# Patient Record
Sex: Female | Born: 1955 | Race: Black or African American | Hispanic: No | Smoking: Never smoker
Health system: Southern US, Community
[De-identification: ages and names within clinical notes are randomized; demographics above are authoritative.]

## PROBLEM LIST (undated history)

## (undated) DIAGNOSIS — E7800 Pure hypercholesterolemia, unspecified: Secondary | ICD-10-CM

## (undated) DIAGNOSIS — E119 Type 2 diabetes mellitus without complications: Secondary | ICD-10-CM

## (undated) DIAGNOSIS — M1990 Unspecified osteoarthritis, unspecified site: Secondary | ICD-10-CM

## (undated) DIAGNOSIS — I10 Essential (primary) hypertension: Secondary | ICD-10-CM

## (undated) DIAGNOSIS — G5600 Carpal tunnel syndrome, unspecified upper limb: Secondary | ICD-10-CM

## (undated) HISTORY — DX: Essential (primary) hypertension: I10

## (undated) HISTORY — DX: Type 2 diabetes mellitus without complications: E11.9

## (undated) HISTORY — DX: Pure hypercholesterolemia, unspecified: E78.00

## (undated) HISTORY — DX: Unspecified osteoarthritis, unspecified site: M19.90

## (undated) HISTORY — PX: TONSILLECTOMY: SUR1361

## (undated) HISTORY — PX: CHOLECYSTECTOMY: SHX55

## (undated) HISTORY — DX: Carpal tunnel syndrome, unspecified upper limb: G56.00

---

## 1984-10-04 HISTORY — PX: TUBAL LIGATION: SHX77

## 1993-10-04 HISTORY — PX: KNEE ARTHROSCOPY: SUR90

## 1999-11-20 ENCOUNTER — Other Ambulatory Visit: Admission: RE | Admit: 1999-11-20 | Discharge: 1999-11-20 | Payer: Self-pay | Admitting: *Deleted

## 2000-12-01 ENCOUNTER — Other Ambulatory Visit: Admission: RE | Admit: 2000-12-01 | Discharge: 2000-12-01 | Payer: Self-pay | Admitting: *Deleted

## 2002-03-09 ENCOUNTER — Other Ambulatory Visit: Admission: RE | Admit: 2002-03-09 | Discharge: 2002-03-09 | Payer: Self-pay | Admitting: *Deleted

## 2010-12-10 ENCOUNTER — Other Ambulatory Visit: Payer: Self-pay | Admitting: Internal Medicine

## 2010-12-10 DIAGNOSIS — Z1231 Encounter for screening mammogram for malignant neoplasm of breast: Secondary | ICD-10-CM

## 2010-12-25 ENCOUNTER — Ambulatory Visit
Admission: RE | Admit: 2010-12-25 | Discharge: 2010-12-25 | Disposition: A | Discharge status: Home / Self Care | Payer: 59 | Source: Ambulatory Visit | Attending: Internal Medicine | Admitting: Internal Medicine

## 2010-12-25 DIAGNOSIS — Z1231 Encounter for screening mammogram for malignant neoplasm of breast: Secondary | ICD-10-CM

## 2011-11-26 ENCOUNTER — Other Ambulatory Visit: Payer: Self-pay | Admitting: Internal Medicine

## 2011-11-26 DIAGNOSIS — Z1231 Encounter for screening mammogram for malignant neoplasm of breast: Secondary | ICD-10-CM

## 2011-12-27 ENCOUNTER — Ambulatory Visit: Payer: 59

## 2011-12-30 ENCOUNTER — Ambulatory Visit
Admission: RE | Admit: 2011-12-30 | Discharge: 2011-12-30 | Disposition: A | Discharge status: Home / Self Care | Payer: 59 | Source: Ambulatory Visit | Attending: Internal Medicine | Admitting: Internal Medicine

## 2011-12-30 DIAGNOSIS — Z1231 Encounter for screening mammogram for malignant neoplasm of breast: Secondary | ICD-10-CM

## 2012-11-29 ENCOUNTER — Other Ambulatory Visit: Payer: Self-pay

## 2012-11-29 DIAGNOSIS — Z1231 Encounter for screening mammogram for malignant neoplasm of breast: Secondary | ICD-10-CM

## 2013-02-02 ENCOUNTER — Ambulatory Visit: Payer: 59

## 2014-01-09 ENCOUNTER — Other Ambulatory Visit: Payer: Self-pay

## 2014-01-09 DIAGNOSIS — Z1231 Encounter for screening mammogram for malignant neoplasm of breast: Secondary | ICD-10-CM

## 2014-01-17 ENCOUNTER — Ambulatory Visit
Admission: RE | Admit: 2014-01-17 | Discharge: 2014-01-17 | Disposition: A | Discharge status: Home / Self Care | Payer: 59 | Source: Ambulatory Visit

## 2014-01-17 DIAGNOSIS — Z1231 Encounter for screening mammogram for malignant neoplasm of breast: Secondary | ICD-10-CM

## 2014-12-19 ENCOUNTER — Other Ambulatory Visit: Payer: Self-pay

## 2014-12-19 DIAGNOSIS — Z1231 Encounter for screening mammogram for malignant neoplasm of breast: Secondary | ICD-10-CM

## 2015-01-31 ENCOUNTER — Ambulatory Visit: Payer: Self-pay

## 2015-02-06 ENCOUNTER — Ambulatory Visit
Admission: RE | Admit: 2015-02-06 | Discharge: 2015-02-06 | Disposition: A | Discharge status: Home / Self Care | Payer: 59 | Source: Ambulatory Visit

## 2015-02-06 DIAGNOSIS — Z1231 Encounter for screening mammogram for malignant neoplasm of breast: Secondary | ICD-10-CM

## 2015-12-11 ENCOUNTER — Encounter: Payer: 59 | Admitting: Neurology

## 2016-01-01 ENCOUNTER — Encounter: Payer: Self-pay | Admitting: Neurology

## 2016-01-01 ENCOUNTER — Encounter: Payer: 59 | Admitting: Neurology

## 2016-01-01 ENCOUNTER — Ambulatory Visit (INDEPENDENT_AMBULATORY_CARE_PROVIDER_SITE_OTHER): Payer: 59 | Admitting: Neurology

## 2016-01-01 ENCOUNTER — Ambulatory Visit (INDEPENDENT_AMBULATORY_CARE_PROVIDER_SITE_OTHER): Payer: Self-pay | Admitting: Neurology

## 2016-01-01 DIAGNOSIS — G5601 Carpal tunnel syndrome, right upper limb: Secondary | ICD-10-CM

## 2016-01-01 DIAGNOSIS — G5600 Carpal tunnel syndrome, unspecified upper limb: Secondary | ICD-10-CM

## 2016-01-01 HISTORY — DX: Carpal tunnel syndrome, unspecified upper limb: G56.00

## 2016-01-01 NOTE — Procedures (Signed)
HISTORY:  Heather Tanner is a 60 year old patient with a history of diabetes who reports paresthesias and numbness in the feet, she is also had similar sensations in the arms, but she also has some discomfort in the shoulders and the upper arms as well, with no significant neck discomfort. The patient is being evaluated for possible neuropathy or a cervical radiculopathy.  NERVE CONDUCTION STUDIES:  Nerve conduction studies were performed on both upper extremities. The distal motor latencies for the median nerves were prolonged on the right, normal on the left, and with normal motor amplitudes for these nerves bilaterally. The distal motor latencies and motor amplitudes for the ulnar nerves were normal bilaterally. The F wave latencies and nerve conduction velocities for the median and ulnar nerves were normal bilaterally. The sensory latencies for the median nerves were prolonged on the right, normal on the left, with normal ulnar sensory latencies bilaterally.  Nerve conduction studies were performed on the right lower extremity. The distal motor latencies and motor amplitudes for the peroneal and posterior tibial nerves were within normal limits. The nerve conduction velocities for these nerves were also normal. The H reflex latency was normal. The sensory latency for the peroneal nerve was within normal limits.   EMG STUDIES:  EMG study was performed on the right upper extremity:  The first dorsal interosseous muscle reveals 2 to 4 K units with full recruitment. No fibrillations or positive waves were noted. The abductor pollicis brevis muscle reveals 2 to 4 K units with slightly reduced recruitment. No fibrillations or positive waves were noted. The extensor indicis proprius muscle reveals 1 to 3 K units with full recruitment. No fibrillations or positive waves were noted. The pronator teres muscle reveals 2 to 3 K units with full recruitment. No fibrillations or positive waves  were noted. The biceps muscle reveals 1 to 2 K units with full recruitment. No fibrillations or positive waves were noted. The triceps muscle reveals 2 to 4 K units with full recruitment. No fibrillations or positive waves were noted. The anterior deltoid muscle reveals 2 to 3 K units with full recruitment. No fibrillations or positive waves were noted. The cervical paraspinal muscles were tested at 2 levels. No abnormalities of insertional activity were seen at either level tested. There was good relaxation.  EMG study was performed on the left upper extremity:  The first dorsal interosseous muscle reveals 2 to 4 K units with full recruitment. No fibrillations or positive waves were noted. The abductor pollicis brevis muscle reveals 2 to 4 K units with full recruitment. No fibrillations or positive waves were noted. The extensor indicis proprius muscle reveals 1 to 3 K units with full recruitment. No fibrillations or positive waves were noted. The pronator teres muscle reveals 2 to 3 K units with full recruitment. No fibrillations or positive waves were noted. The biceps muscle reveals 1 to 2 K units with full recruitment. No fibrillations or positive waves were noted. The triceps muscle reveals 2 to 4 K units with full recruitment. No fibrillations or positive waves were noted. The anterior deltoid muscle reveals 2 to 3 K units with full recruitment. No fibrillations or positive waves were noted. The cervical paraspinal muscles were tested at 2 levels. No abnormalities of insertional activity were seen at either level tested. There was good relaxation.   IMPRESSION:  Nerve conduction studies done on both upper extremities and on the right lower extremity reveals no clear evidence of a peripheral neuropathy. There does  appear to be evidence of a mild right carpal tunnel syndrome. No other significant abnormalities were seen. EMG evaluation on both upper extremities shows no evidence of an  overlying cervical radiculopathy on either side.  Jill Alexanders MD 01/01/2016 11:12 AM  [facility] [address] [place], [place] [address]  Phone [phone] Fax [phone]

## 2016-01-01 NOTE — Progress Notes (Signed)
Please refer to EMG and nerve conduction study procedure note. 

## 2016-05-13 ENCOUNTER — Other Ambulatory Visit: Payer: Self-pay | Admitting: Internal Medicine

## 2016-05-13 DIAGNOSIS — Z1231 Encounter for screening mammogram for malignant neoplasm of breast: Secondary | ICD-10-CM

## 2016-05-21 ENCOUNTER — Ambulatory Visit
Admission: RE | Admit: 2016-05-21 | Discharge: 2016-05-21 | Disposition: A | Discharge status: Home / Self Care | Payer: 59 | Source: Ambulatory Visit | Attending: Internal Medicine | Admitting: Internal Medicine

## 2016-05-21 DIAGNOSIS — Z1231 Encounter for screening mammogram for malignant neoplasm of breast: Secondary | ICD-10-CM

## 2017-01-10 DIAGNOSIS — G4700 Insomnia, unspecified: Secondary | ICD-10-CM | POA: Diagnosis not present

## 2017-01-10 DIAGNOSIS — I10 Essential (primary) hypertension: Secondary | ICD-10-CM | POA: Diagnosis not present

## 2017-01-10 DIAGNOSIS — E1140 Type 2 diabetes mellitus with diabetic neuropathy, unspecified: Secondary | ICD-10-CM | POA: Diagnosis not present

## 2017-04-13 DIAGNOSIS — I10 Essential (primary) hypertension: Secondary | ICD-10-CM | POA: Diagnosis not present

## 2017-04-13 DIAGNOSIS — Z79899 Other long term (current) drug therapy: Secondary | ICD-10-CM | POA: Diagnosis not present

## 2017-04-13 DIAGNOSIS — E1140 Type 2 diabetes mellitus with diabetic neuropathy, unspecified: Secondary | ICD-10-CM | POA: Diagnosis not present

## 2017-04-27 ENCOUNTER — Other Ambulatory Visit: Payer: Self-pay | Admitting: Internal Medicine

## 2017-04-27 DIAGNOSIS — Z1231 Encounter for screening mammogram for malignant neoplasm of breast: Secondary | ICD-10-CM

## 2017-05-24 ENCOUNTER — Ambulatory Visit
Admission: RE | Admit: 2017-05-24 | Discharge: 2017-05-24 | Disposition: A | Discharge status: Home / Self Care | Payer: 59 | Source: Ambulatory Visit | Attending: Internal Medicine | Admitting: Internal Medicine

## 2017-05-24 DIAGNOSIS — Z1231 Encounter for screening mammogram for malignant neoplasm of breast: Secondary | ICD-10-CM | POA: Diagnosis not present

## 2017-08-17 DIAGNOSIS — I10 Essential (primary) hypertension: Secondary | ICD-10-CM | POA: Diagnosis not present

## 2017-08-17 DIAGNOSIS — G4700 Insomnia, unspecified: Secondary | ICD-10-CM | POA: Diagnosis not present

## 2017-08-17 DIAGNOSIS — E1140 Type 2 diabetes mellitus with diabetic neuropathy, unspecified: Secondary | ICD-10-CM | POA: Diagnosis not present

## 2017-11-25 DIAGNOSIS — I10 Essential (primary) hypertension: Secondary | ICD-10-CM | POA: Diagnosis not present

## 2017-11-25 DIAGNOSIS — E1140 Type 2 diabetes mellitus with diabetic neuropathy, unspecified: Secondary | ICD-10-CM | POA: Diagnosis not present

## 2017-11-25 DIAGNOSIS — E559 Vitamin D deficiency, unspecified: Secondary | ICD-10-CM | POA: Diagnosis not present

## 2017-11-25 DIAGNOSIS — Z0000 Encounter for general adult medical examination without abnormal findings: Secondary | ICD-10-CM | POA: Diagnosis not present

## 2017-11-25 DIAGNOSIS — Z7689 Persons encountering health services in other specified circumstances: Secondary | ICD-10-CM | POA: Diagnosis not present

## 2017-11-30 DIAGNOSIS — Z0000 Encounter for general adult medical examination without abnormal findings: Secondary | ICD-10-CM | POA: Diagnosis not present

## 2017-11-30 DIAGNOSIS — E1140 Type 2 diabetes mellitus with diabetic neuropathy, unspecified: Secondary | ICD-10-CM | POA: Diagnosis not present

## 2017-11-30 DIAGNOSIS — I10 Essential (primary) hypertension: Secondary | ICD-10-CM | POA: Diagnosis not present

## 2017-12-21 DIAGNOSIS — M1990 Unspecified osteoarthritis, unspecified site: Secondary | ICD-10-CM | POA: Insufficient documentation

## 2018-01-12 DIAGNOSIS — M65341 Trigger finger, right ring finger: Secondary | ICD-10-CM | POA: Diagnosis not present

## 2018-01-12 DIAGNOSIS — M65331 Trigger finger, right middle finger: Secondary | ICD-10-CM | POA: Diagnosis not present

## 2018-01-12 DIAGNOSIS — M79641 Pain in right hand: Secondary | ICD-10-CM | POA: Diagnosis not present

## 2018-03-02 DIAGNOSIS — Z1389 Encounter for screening for other disorder: Secondary | ICD-10-CM | POA: Diagnosis not present

## 2018-03-02 DIAGNOSIS — E1140 Type 2 diabetes mellitus with diabetic neuropathy, unspecified: Secondary | ICD-10-CM | POA: Diagnosis not present

## 2018-03-02 DIAGNOSIS — M25569 Pain in unspecified knee: Secondary | ICD-10-CM | POA: Diagnosis not present

## 2018-03-02 DIAGNOSIS — I10 Essential (primary) hypertension: Secondary | ICD-10-CM | POA: Diagnosis not present

## 2018-03-30 DIAGNOSIS — M25562 Pain in left knee: Secondary | ICD-10-CM | POA: Insufficient documentation

## 2018-03-30 DIAGNOSIS — M25561 Pain in right knee: Secondary | ICD-10-CM | POA: Insufficient documentation

## 2018-04-17 ENCOUNTER — Other Ambulatory Visit: Payer: Self-pay | Admitting: Internal Medicine

## 2018-04-17 DIAGNOSIS — Z1231 Encounter for screening mammogram for malignant neoplasm of breast: Secondary | ICD-10-CM

## 2018-06-01 ENCOUNTER — Ambulatory Visit: Payer: 59

## 2018-06-02 ENCOUNTER — Ambulatory Visit
Admission: RE | Admit: 2018-06-02 | Discharge: 2018-06-02 | Disposition: A | Discharge status: Home / Self Care | Payer: 59 | Source: Ambulatory Visit | Attending: Internal Medicine | Admitting: Internal Medicine

## 2018-06-02 DIAGNOSIS — Z1231 Encounter for screening mammogram for malignant neoplasm of breast: Secondary | ICD-10-CM | POA: Diagnosis not present

## 2018-06-07 DIAGNOSIS — I129 Hypertensive chronic kidney disease with stage 1 through stage 4 chronic kidney disease, or unspecified chronic kidney disease: Secondary | ICD-10-CM | POA: Diagnosis not present

## 2018-06-07 DIAGNOSIS — M6530 Trigger finger, unspecified finger: Secondary | ICD-10-CM | POA: Diagnosis not present

## 2018-06-07 DIAGNOSIS — N182 Chronic kidney disease, stage 2 (mild): Secondary | ICD-10-CM | POA: Diagnosis not present

## 2018-06-07 DIAGNOSIS — Z23 Encounter for immunization: Secondary | ICD-10-CM | POA: Diagnosis not present

## 2018-06-07 DIAGNOSIS — E1140 Type 2 diabetes mellitus with diabetic neuropathy, unspecified: Secondary | ICD-10-CM | POA: Diagnosis not present

## 2018-06-22 DIAGNOSIS — M25561 Pain in right knee: Secondary | ICD-10-CM | POA: Diagnosis not present

## 2018-06-22 DIAGNOSIS — M238X2 Other internal derangements of left knee: Secondary | ICD-10-CM | POA: Diagnosis not present

## 2018-06-22 DIAGNOSIS — M238X1 Other internal derangements of right knee: Secondary | ICD-10-CM | POA: Diagnosis not present

## 2018-06-22 DIAGNOSIS — M25562 Pain in left knee: Secondary | ICD-10-CM | POA: Diagnosis not present

## 2018-07-21 DIAGNOSIS — E113391 Type 2 diabetes mellitus with moderate nonproliferative diabetic retinopathy without macular edema, right eye: Secondary | ICD-10-CM | POA: Diagnosis not present

## 2018-07-21 DIAGNOSIS — H43811 Vitreous degeneration, right eye: Secondary | ICD-10-CM | POA: Diagnosis not present

## 2018-07-21 DIAGNOSIS — E113312 Type 2 diabetes mellitus with moderate nonproliferative diabetic retinopathy with macular edema, left eye: Secondary | ICD-10-CM | POA: Diagnosis not present

## 2018-08-02 ENCOUNTER — Encounter: Payer: Self-pay | Admitting: Internal Medicine

## 2018-08-02 ENCOUNTER — Ambulatory Visit: Payer: 59 | Admitting: Internal Medicine

## 2018-08-02 VITALS — BP 118/78 | HR 73 | Temp 97.8°F | Ht 62.5 in | Wt 149.8 lb

## 2018-08-02 DIAGNOSIS — Z794 Long term (current) use of insulin: Secondary | ICD-10-CM | POA: Diagnosis not present

## 2018-08-02 DIAGNOSIS — E113552 Type 2 diabetes mellitus with stable proliferative diabetic retinopathy, left eye: Secondary | ICD-10-CM | POA: Diagnosis not present

## 2018-08-02 DIAGNOSIS — E1140 Type 2 diabetes mellitus with diabetic neuropathy, unspecified: Secondary | ICD-10-CM | POA: Diagnosis not present

## 2018-08-02 DIAGNOSIS — E1122 Type 2 diabetes mellitus with diabetic chronic kidney disease: Secondary | ICD-10-CM | POA: Insufficient documentation

## 2018-08-02 DIAGNOSIS — H259 Unspecified age-related cataract: Secondary | ICD-10-CM | POA: Insufficient documentation

## 2018-08-02 NOTE — Patient Instructions (Signed)
Diabetic Neuropathy Diabetic neuropathy is a nerve disease or nerve damage that is caused by diabetes mellitus. About half of all people with diabetes mellitus have some form of nerve damage. Nerve damage is more common in those who have had diabetes mellitus for many years and who generally have not had good control of their blood sugar (glucose) level. Diabetic neuropathy is a common complication of diabetes mellitus. There are three common types of diabetic neuropathy and a fourth type that is less common and less understood:  Peripheral neuropathy-This is the most common type of diabetic neuropathy. It causes damage to the nerves of the feet and legs first and then eventually the hands and arms. The damage affects the ability to sense touch.  Autonomic neuropathy-This type causes damage to the autonomic nervous system, which controls the following functions: ? Heartbeat. ? Body temperature. ? Blood pressure. ? Urination. ? Digestion. ? Sweating. ? Sexual function.  Focal neuropathy-Focal neuropathy can be painful and unpredictable and occurs most often in older adults with diabetes mellitus. It involves a specific nerve or one area and often comes on suddenly. It usually does not cause long-term problems.  Radiculoplexus neuropathy- Sometimes called lumbosacral radiculoplexus neuropathy, radiculoplexus neuropathy affects the nerves of the thighs, hips, buttocks, or legs. It is more common in people with type 2 diabetes mellitus and in older men. It is characterized by debilitating pain, weakness, and atrophy, usually in the thigh muscles.  What are the causes? The cause of peripheral, autonomic, and focal neuropathies is diabetes mellitus that is uncontrolled and high glucose levels. The cause of radiculoplexus neuropathy is unknown. However, it is thought to be caused by inflammation related to uncontrolled glucose levels. What are the signs or symptoms? Peripheral Neuropathy Peripheral  neuropathy develops slowly over time. When the nerves of the feet and legs no longer work there may be:  Burning, stabbing, or aching pain in the legs or feet.  Inability to feel pressure or pain in your feet. This can lead to: ? Thick calluses over pressure areas. ? Pressure sores. ? Ulcers.  Foot deformities.  Reduced ability to feel temperature changes.  Muscle weakness.  Autonomic Neuropathy The symptoms of autonomic neuropathy vary depending on which nerves are affected. Symptoms may include:  Problems with digestion, such as: ? Feeling sick to your stomach (nausea). ? Vomiting. ? Bloating. ? Constipation. ? Diarrhea. ? Abdominal pain.  Difficulty with urination. This occurs if you lose your ability to sense when your bladder is full. Problems include: ? Urine leakage (incontinence). ? Inability to empty your bladder completely (retention).  Rapid or irregular heartbeat (palpitations).  Blood pressure drops when you stand up (orthostatic hypotension). When you stand up you may feel: ? Dizzy. ? Weak. ? Faint.  In men, inability to attain and maintain an erection.  In women, vaginal dryness and problems with decreased sexual desire and arousal.  Problems with body temperature regulation.  Increased or decreased sweating.  Focal Neuropathy  Abnormal eye movements or abnormal alignment of both eyes.  Weakness in the wrist.  Foot drop. This results in an inability to lift the foot properly and abnormal walking or foot movement.  Paralysis on one side of your face (Bell palsy).  Chest or abdominal pain. Radiculoplexus Neuropathy  Sudden, severe pain in your hip, thigh, or buttocks.  Weakness and wasting of thigh muscles.  Difficulty rising from a seated position.  Abdominal swelling.  Unexplained weight loss (usually more than 10 lb [4.5 kg]). How is   this diagnosed? Peripheral Neuropathy Your senses may be tested. Sensory function testing can be  done with:  A light touch using a monofilament.  A vibration with tuning fork.  A sharp sensation with a pin prick.  Other tests that can help diagnose neuropathy are:  Nerve conduction velocity. This test checks the transmission of an electrical current through a nerve.  Electromyography. This shows how muscles respond to electrical signals transmitted by nearby nerves.  Quantitative sensory testing. This is used to assess how your nerves respond to vibrations and changes in temperature.  Autonomic Neuropathy Diagnosis is often based on reported symptoms. Tell your health care provider if you experience:  Dizziness.  Constipation.  Diarrhea.  Inappropriate urination or inability to urinate.  Inability to get or maintain an erection.  Tests that may be done include:  Electrocardiography or Holter monitor. These are tests that can help show problems with the heart rate or heart rhythm.  An X-ray exam may be done.  Focal Neuropathy Diagnosis is made based on your symptoms and what your health care provider finds during your exam. Other tests may be done. They may include:  Nerve conduction velocities. This checks the transmission of electrical current through a nerve.  Electromyography. This shows how muscles respond to electrical signals transmitted by nearby nerves.  Quantitative sensory testing. This test is used to assess how your nerves respond to vibration and changes in temperature.  Radiculoplexus Neuropathy  Often the first thing is to eliminate any other issue or problems that might be the cause, as there is no standard test for diagnosis.  X-ray exam of your spine and lumbar region.  Spinal tap to rule out cancer.  MRI to rule out other lesions. How is this treated? Once nerve damage occurs, it cannot be reversed. The goal of treatment is to keep the disease or nerve damage from getting worse and affecting more nerve fibers. Controlling your blood  glucose level is the key. Most people with radiculoplexus neuropathy see at least a partial improvement over time. You will need to keep your blood glucose and HbA1c levels in the target range determined by your health care provider. Things that help control blood glucose levels include:  Blood glucose monitoring.  Meal planning.  Physical activity.  Diabetes medicine.  Over time, maintaining lower blood glucose levels helps lessen symptoms. Sometimes, prescription pain medicine is needed. Follow these instructions at home:  Do not smoke.  Keep your blood glucose level in the range that you and your health care provider have determined acceptable for you.  Keep your blood pressure level in the range that you and your health care provider have determined acceptable for you.  Eat a well-balanced diet.  Be physically active every day. Include strength training and balance exercises.  Protect your feet. ? Check your feet every day for sores, cuts, blisters, or signs of infection. ? Wear padded socks and supportive shoes. Use orthotic inserts, if necessary. ? Regularly check the insides of your shoes for worn spots. Make sure there are no rocks or other items inside your shoes before you put them on. Contact a health care provider if:  You have burning, stabbing, or aching pain in the legs or feet.  You are unable to feel pressure or pain in your feet.  You develop problems with digestion such as: ? Nausea. ? Vomiting. ? Bloating. ? Constipation. ? Diarrhea. ? Abdominal pain.  You have difficulty with urination, such as: ? Incontinence. ? Retention.    You have palpitations.  You develop orthostatic hypotension. When you stand up you may feel: ? Dizzy. ? Weak. ? Faint.  You cannot attain and maintain an erection (in men).  You have vaginal dryness and problems with decreased sexual desire and arousal (in women).  You have severe pain in your thighs, legs, or  buttocks.  You have unexplained weight loss. This information is not intended to replace advice given to you by your health care provider. Make sure you discuss any questions you have with your health care provider. Document Released: 11/29/2001 Document Revised: 02/26/2016 Document Reviewed: 03/01/2013 Elsevier Interactive Patient Education  2017 [employer]  

## 2018-08-06 NOTE — Progress Notes (Signed)
Subjective:     Patient ID: Heather Tanner , female    DOB: [birth year] , 62 y.o.   MRN: [id number]   Chief Complaint  Patient presents with  . Gabapentin f/u    HPI  SHE IS HERE TODAY FOR F/U DM NEUROPATHY.  THE DOSE OF GABAPENTIN WAS INCREASED TO 100MG  PO QAM AND 200MG  PO QPM. SHE HAS NOT NOTICED ANY IMPROVEMENT IN HER SX.   ADDITIONALLY, SHE RECENTLY HAD EYE EXAM AND SHE WANTS TO DISCUSS FURTHER.     Past Medical History:  Diagnosis Date  . Carpal tunnel syndrome 01/01/2016   right     Family History  Problem Relation Age of Onset  . Diabetes Mother   . Heart Problems Mother   . Hypertension Mother   . Diabetes Father   . Hypertension Father   . Heart Problems Father   . Hypertension Sister   . Stroke Sister   . Hyperlipidemia Sister   . Heart Problems Brother      Current Outpatient Medications:  .  Albuterol Sulfate (PROAIR RESPICLICK) 027 (90 Base) MCG/ACT AEPB, Inhale into the lungs., Disp: , Rfl:  .  BABY ASPIRIN PO, Take by mouth daily., Disp: , Rfl:  .  CALCIUM-MAGNESIUM-ZINC PO, Take by mouth., Disp: , Rfl:  .  gabapentin (NEURONTIN) 300 MG capsule, Take 300 mg by mouth 3 (three) times daily., Disp: , Rfl:  .  Insulin Detemir (LEVEMIR FLEXTOUCH) 100 UNIT/ML Pen, Inject 20 Units into the skin at bedtime., Disp: , Rfl:  .  Insulin Pen Needle (PEN NEEDLES) 31G X 5 MM MISC, by Does not apply route., Disp: , Rfl:  .  lisinopril (PRINIVIL,ZESTRIL) 10 MG tablet, Take 10 mg by mouth daily., Disp: , Rfl:  .  Multiple Vitamin (MULTIVITAMIN) tablet, Take 1 tablet by mouth daily., Disp: , Rfl:  .  naproxen sodium (ALEVE) 220 MG tablet, Take 220 mg by mouth 2 (two) times daily as needed., Disp: , Rfl:  .  ranitidine (ZANTAC) 150 MG tablet, Take 150 mg by mouth 2 (two) times daily., Disp: , Rfl:  .  SitaGLIPtin-MetFORMIN HCl (JANUMET XR) 50-1000 MG TB24, Take 1 tablet by mouth daily., Disp: , Rfl:    No Known Allergies   Review of Systems  Constitutional:  Negative.   Respiratory: Negative.   Cardiovascular: Negative.   Gastrointestinal: Negative.   Endocrine: Negative.   Genitourinary: Negative.   Neurological: Positive for numbness.  Psychiatric/Behavioral: Negative.      Today's Vitals   08/02/18 1518  BP: 118/72  Pulse: 73  Temp: 97.8 F (36.6 C)  Weight: 149 lb 12.8 oz (67.9 kg)  Height: 5' 2.5" (1.588 m)   Body mass index is 26.96 kg/m.   Objective:  Physical Exam  Constitutional: She is oriented to person, place, and time. She appears well-developed and well-nourished.  HENT:  Head: Normocephalic and atraumatic.  Eyes: EOM are normal.  Cardiovascular: Normal rate, regular rhythm and normal heart sounds.  Pulmonary/Chest: Effort normal and breath sounds normal.  Neurological: She is alert and oriented to person, place, and time.  Skin: Skin is warm and dry.  Psychiatric: She has a normal mood and affect.  Nursing note and vitals reviewed.       Assessment And Plan:     1. Type 2 diabetes mellitus with diabetic neuropathy, with long-term current use of insulin (HCC)  I WILL D/C GABAPENTIN. I WILL START HER ON LYRICA 50MG  TWICE DAILY X 7DAYS, THEN INCREASE HER DOSE TO  75MG  TWICE DAILY. SHE WILL RTO IN SIX WEEKS FOR RE-EVALUATION. POSSIBLE SIDE EFFECTS WERE DISCUSSED WITH THE PATIENT IN DETAIL.   2. Senile cataract of left eye, unspecified age-related cataract type  MGMT AS PER OPHTHALMOLOGY.   3. 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 IMPORTANCE OF OPTIMAL BS CONTROL TO AVOID PROGRESSION OF DISEASE WAS DISCUSSED WITH THE PATIENT. I WILL REQUEST RECORDS FROM HER OPHTHALMOLOGIST. GREATER THAN 50% OF FACE TO FACE TIME WAS SPENT IN COUNSELING AND COORDINATION OF CARE. I SPENT MORE THAN 25 MIN WITH THE PATIENT.    [name], MD

## 2018-08-08 ENCOUNTER — Encounter: Payer: Self-pay | Admitting: Internal Medicine

## 2018-08-09 ENCOUNTER — Other Ambulatory Visit: Payer: Self-pay

## 2018-08-09 MED ORDER — PREGABALIN 50 MG PO CAPS: ORAL_CAPSULE | ORAL | 0 refills | Status: DC

## 2018-08-13 ENCOUNTER — Other Ambulatory Visit: Payer: Self-pay | Admitting: Internal Medicine

## 2018-08-13 DIAGNOSIS — D509 Iron deficiency anemia, unspecified: Secondary | ICD-10-CM

## 2018-08-13 DIAGNOSIS — D75839 Thrombocytosis, unspecified: Secondary | ICD-10-CM

## 2018-08-13 DIAGNOSIS — D473 Essential (hemorrhagic) thrombocythemia: Secondary | ICD-10-CM

## 2018-08-16 ENCOUNTER — Telehealth: Payer: Self-pay | Admitting: Hematology

## 2018-08-16 ENCOUNTER — Encounter: Payer: Self-pay | Admitting: Internal Medicine

## 2018-08-16 NOTE — Telephone Encounter (Signed)
lmom for pt to return call to office to schedule new patient appt. Mailed appt letter to inform pt of appt date 09/08/18 at 1145 am

## 2018-08-22 ENCOUNTER — Encounter: Payer: Self-pay | Admitting: Internal Medicine

## 2018-08-22 ENCOUNTER — Telehealth: Payer: Self-pay

## 2018-08-22 ENCOUNTER — Other Ambulatory Visit: Payer: Self-pay | Admitting: Internal Medicine

## 2018-08-22 NOTE — Progress Notes (Signed)
Pt's dose of Lyrica changed to 75mg  twice daily.

## 2018-08-22 NOTE — Telephone Encounter (Signed)
Patient called stating she thinks she picked up the incorrect medication today she stated the dose is not the same as the previous dose. She stated the pills are 165mg  and the one she was on before was 50 and 75mg . She stated she has not started taking the medicine until we give her the clear to go ahead and do so. Please advise YRL,RMA

## 2018-08-30 DIAGNOSIS — M25562 Pain in left knee: Secondary | ICD-10-CM | POA: Diagnosis not present

## 2018-08-30 DIAGNOSIS — M25561 Pain in right knee: Secondary | ICD-10-CM | POA: Diagnosis not present

## 2018-08-30 DIAGNOSIS — M1711 Unilateral primary osteoarthritis, right knee: Secondary | ICD-10-CM | POA: Diagnosis not present

## 2018-09-08 ENCOUNTER — Ambulatory Visit: Payer: 59

## 2018-09-08 ENCOUNTER — Other Ambulatory Visit: Payer: 59

## 2018-09-08 ENCOUNTER — Ambulatory Visit: Payer: 59 | Admitting: Hematology

## 2018-09-11 ENCOUNTER — Ambulatory Visit: Payer: 59 | Admitting: Internal Medicine

## 2018-09-20 DIAGNOSIS — E113312 Type 2 diabetes mellitus with moderate nonproliferative diabetic retinopathy with macular edema, left eye: Secondary | ICD-10-CM | POA: Diagnosis not present

## 2018-10-12 ENCOUNTER — Ambulatory Visit: Payer: 59 | Admitting: Internal Medicine

## 2018-10-12 ENCOUNTER — Encounter: Payer: Self-pay | Admitting: Internal Medicine

## 2018-10-12 VITALS — BP 110/74 | HR 98 | Temp 78.0°F | Ht 62.5 in | Wt 145.6 lb

## 2018-10-12 DIAGNOSIS — N182 Chronic kidney disease, stage 2 (mild): Secondary | ICD-10-CM | POA: Diagnosis not present

## 2018-10-12 DIAGNOSIS — Z794 Long term (current) use of insulin: Secondary | ICD-10-CM | POA: Diagnosis not present

## 2018-10-12 DIAGNOSIS — E1141 Type 2 diabetes mellitus with diabetic mononeuropathy: Secondary | ICD-10-CM | POA: Diagnosis not present

## 2018-10-12 DIAGNOSIS — I129 Hypertensive chronic kidney disease with stage 1 through stage 4 chronic kidney disease, or unspecified chronic kidney disease: Secondary | ICD-10-CM

## 2018-10-12 MED ORDER — GABAPENTIN ENACARBIL ER 600 MG PO TBCR: 600.0000 mg | EXTENDED_RELEASE_TABLET | Freq: Two times a day (BID) | ORAL | 1 refills | Status: DC

## 2018-10-12 NOTE — Patient Instructions (Signed)
Diabetic Neuropathy Diabetic neuropathy refers to nerve damage that is caused by diabetes (diabetes mellitus). Over time, people with diabetes can develop nerve damage throughout the body. There are several types of diabetic neuropathy:  Peripheral neuropathy. This is the most common type of diabetic neuropathy. It causes damage to nerves that carry signals between the spinal cord and other parts of the body (peripheral nerves). This usually affects nerves in the feet and legs first, and may eventually affect the hands and arms. The damage affects the ability to sense touch or temperature.  Autonomic neuropathy. This type causes damage to nerves that control involuntary functions (autonomic nerves). These nerves carry signals that control: ? Heartbeat. ? Body temperature. ? Blood pressure. ? Urination. ? Digestion. ? Sweating. ? Sexual function. ? Response to changing blood sugar (glucose) levels.  Focal neuropathy. This type of nerve damage affects one area of the body, such as an arm, a leg, or the face. The injury may involve one nerve or a small group of nerves. Focal neuropathy can be painful and unpredictable, and occurs most often in older adults with diabetes. This often develops suddenly, but usually improves over time and does not cause long-term problems.  Proximal neuropathy. This type of nerve damage affects the nerves of the thighs, hips, buttocks, or legs. It causes severe pain, weakness, and muscle death (atrophy), usually in the thigh muscles. It is more common among older men and people who have type 2 diabetes. The length of recovery time may vary. What are the causes? Peripheral, autonomic, and focal neuropathies are caused by diabetes that is not well controlled with treatment. The cause of proximal neuropathy is not known, but it may be caused by inflammation related to uncontrolled blood glucose levels. What are the signs or symptoms? Peripheral neuropathy Peripheral  neuropathy develops slowly over time. When the nerves of the feet and legs no longer work, you may experience:  Burning, stabbing, or aching pain in the legs or feet.  Pain or cramping in the legs or feet.  Loss of feeling (numbness) and inability to feel pressure or pain in the feet. This can lead to: ? Thick calluses or sores on areas of constant pressure. ? Ulcers. ? Reduced ability to feel temperature changes.  Foot deformities.  Muscle weakness.  Loss of balance or coordination. Autonomic neuropathy The symptoms of autonomic neuropathy vary depending on which nerves are affected. Symptoms may include:  Problems with digestion, such as: ? Nausea or vomiting. ? Poor appetite. ? Bloating. ? Diarrhea or constipation. ? Trouble swallowing. ? Losing weight without trying to.  Problems with the heart, blood and lungs, such as: ? Dizziness, especially when standing up. ? Fainting. ? Shortness of breath. ? Irregular heartbeat.  Bladder problems, such as: ? Trouble starting or stopping urination. ? Leaking urine. ? Trouble emptying the bladder. ? Urinary tract infections (UTIs).  Problems with other body functions, such as: ? Sweat. You may sweat too much or too little. ? Temperature. You might get hot easily. Or, you might feel cold more than usual. ? Sexual function. Men may not be able to get or maintain an erection. Women may have vaginal dryness and difficulty with arousal. Focal neuropathy Symptoms affect only one area of the body. Common symptoms include:  Numbness.  Tingling.  Burning pain.  Prickling feeling.  Very sensitive skin.  Weakness.  Inability to move (paralysis).  Muscle twitching.  Muscles getting smaller (wasting).  Poor coordination.  Double or blurred vision. Proximal   neuropathy  Sudden, severe pain in the hip, thigh, or buttocks. Pain may spread from the back into the legs (sciatica).  Pain and numbness in the arms and  legs.  Tingling.  Loss of bladder control or bowel control.  Weakness and wasting of thigh muscles.  Difficulty getting up from a seated position.  Abdominal swelling.  Unexplained weight loss. How is this diagnosed? Diagnosis usually involves reviewing your medical history and any symptoms you have. Diagnosis varies depending on the type of neuropathy your health care provider suspects. Peripheral neuropathy Your health care provider will check areas that are affected by your nervous system (neurologic exam), such as your reflexes, how you move, and what you can feel. You may have other tests, such as:  Blood tests.  Removal and examination of fluid that surrounds the spinal cord (lumbar puncture).  CT scan.  MRI.  A test to check the nerves that control muscles (electromyogram, EMG).  Tests of how quickly messages pass through your nerves (nerve conduction velocity tests).  Removal of a small piece of nerve to be examined under a microscope (biopsy). Autonomic neuropathy You may have tests, such as:  Tests to measure your blood pressure and heart rate. This may include monitoring you while you are safely secured to an exam table that moves you from a lying position to an upright position (table tilt test).  Breathing tests to check your lungs.  Tests to check how food moves through the digestive system (gastric emptying tests).  Blood, sweat, or urine tests.  Ultrasound of your bladder.  Spinal fluid tests. Focal neuropathy This condition may be diagnosed with:  A neurologic exam.  CT scan.  MRI.  EMG.  Nerve conduction velocity tests. Proximal neuropathy There is no test to diagnose this type of neuropathy. You may have tests to rule out other possible causes of this type of neuropathy. Tests may include:  X-rays of your spine and lumbar region.  Lumbar puncture.  MRI. How is this treated? The goal of treatment is to keep nerve damage from getting  worse. The most important part of treatment is keeping your blood glucose level and your A1C level within your target range by following your diabetes management plan. Over time, maintaining lower blood glucose levels helps lessen symptoms. In some cases, you may need prescription pain medicine. Follow these instructions at home:  Lifestyle   Do not use any products that contain nicotine or tobacco, such as cigarettes and e-cigarettes. If you need help quitting, ask your health care provider.  Be physically active every day. Include strength training and balance exercises.  Follow a healthy meal plan.  Work with your health care provider to manage your blood pressure. General instructions  Follow your diabetes management plan as directed. ? Check your blood glucose levels as directed by your health care provider. ? Keep your blood glucose in your target range as directed by your health care provider. ? Have your A1C level checked at least two times a year, or as often as told by your health care provider.  Take over the counter and prescription medicines only as told by your health care provider. This includes insulin and diabetes medicine.  Do not drive or use heavy machinery while taking prescription pain medicines.  Check your skin and feet every day for cuts, bruises, redness, blisters, or sores.  Keep all follow up visits as told by your health care provider. This is important. Contact a health care provider if:  by your health care provider.  ? Keep your blood glucose in your target range as directed by your health care provider.  ? Have your A1C level checked at least two times a year, or as often as told by your health care provider.  · Take over the counter and prescription medicines only as told by your health care provider. This includes insulin and diabetes medicine.  · Do not drive or use heavy machinery while taking prescription pain medicines.  · Check your skin and feet every day for cuts, bruises, redness, blisters, or sores.  · Keep all follow up visits as told by your health care provider. This is important.  Contact a health care provider if:  · You have burning, stabbing, or aching pain in your legs or feet.  · You are unable to feel pressure or pain in your feet.  · You develop problems with digestion, such as:  ? Nausea.  ? Vomiting.  ? Bloating.  ? Constipation.  ? Diarrhea.  ? Abdominal pain.  · You have difficulty with urination, such as inability:  ? To control when you urinate (incontinence).  ? To completely empty the bladder (retention).  · You have palpitations.  · You feel dizzy, weak, or faint when you  stand up.  Get help right away if:  · You cannot urinate.  · You have sudden weakness or loss of coordination.  · You have trouble speaking.  · You have pain or pressure in your chest.  · You have an irregular heart beat.  · You have sudden inability to move a part of your body.  Summary  · Diabetic neuropathy refers to nerve damage that is caused by diabetes. It can affect nerves throughout the entire body, causing numbness and pain in the arms, legs, digestive tract, heart, and other body systems.  · Keep your blood glucose level and your blood pressure in your target range, as directed by your health care provider. This can help prevent neuropathy from getting worse.  · Check your skin and feet every day for cuts, bruises, redness, blisters, or sores.  · Do not use any products that contain nicotine or tobacco, such as cigarettes and e-cigarettes. If you need help quitting, ask your health care provider.  This information is not intended to replace advice given to you by your health care provider. Make sure you discuss any questions you have with your health care provider.  Document Released: 11/29/2001 Document Revised: 11/02/2017 Document Reviewed: 10/25/2016  Elsevier Interactive Patient Education © 2019 [employer]

## 2018-10-13 LAB — BMP8+EGFR
BUN/Creatinine Ratio: 24 (ref 12–28)
BUN: 28 mg/dL — ABNORMAL HIGH (ref 8–27)
CO2: 22 mmol/L (ref 20–29)
Calcium: 10.1 mg/dL (ref 8.7–10.3)
Chloride: 99 mmol/L (ref 96–106)
Creatinine, Ser: 1.15 mg/dL — ABNORMAL HIGH (ref 0.57–1.00)
GFR calc Af Amer: 58 mL/min/{1.73_m2} — ABNORMAL LOW (ref >59)
GFR calc non Af Amer: 50 mL/min/{1.73_m2} — ABNORMAL LOW (ref >59)
Glucose: 77 mg/dL (ref 65–99)
POTASSIUM: 4.4 mmol/L (ref 3.5–5.2)
Sodium: 141 mmol/L (ref 134–144)

## 2018-10-13 LAB — HEMOGLOBIN A1C
Est. average glucose Bld gHb Est-mCnc: 137 mg/dL
HEMOGLOBIN A1C: 6.4 % — AB (ref 4.8–5.6)

## 2018-10-13 LAB — HIV ANTIBODY (ROUTINE TESTING W REFLEX): HIV Screen 4th Generation wRfx: NONREACTIVE

## 2018-10-14 ENCOUNTER — Encounter: Payer: Self-pay | Admitting: Internal Medicine

## 2018-10-16 ENCOUNTER — Other Ambulatory Visit: Payer: Self-pay | Admitting: Internal Medicine

## 2018-10-16 NOTE — Progress Notes (Signed)
Here are your lab results:  You are HIV negative.  Your kidney function is slightly decreased. Be sure to stay hydrated. Your hba1c is 6.4, wonderful!  Have a great week!   Sincerely,    Yetzali Weld N. Baird Cancer, MD

## 2018-10-18 ENCOUNTER — Telehealth: Payer: Self-pay

## 2018-10-18 NOTE — Telephone Encounter (Signed)
Left the pt a message that a savings card for janumet has been placed up front for pick-up and a single rx card for the gabapentin.

## 2018-10-23 NOTE — Progress Notes (Signed)
Subjective:     Patient ID: Heather Tanner , female    DOB: [birth year] , 63 y.o.   MRN: [id number]   Chief Complaint  Patient presents with  . Lyrica f/u    HPI  She is here today for f/u diabetic neuropathy. She was started on Lyrica at her last visit. She reports she did not have any improvement of her sx. She wants to resume gabapentin.     Past Medical History:  Diagnosis Date  . Carpal tunnel syndrome 01/01/2016   right     Family History  Problem Relation Age of Onset  . Diabetes Mother   . Heart Problems Mother   . Hypertension Mother   . Diabetes Father   . Hypertension Father   . Heart Problems Father   . Hypertension Sister   . Stroke Sister   . Hyperlipidemia Sister   . Heart Problems Brother      Current Outpatient Medications:  .  Albuterol Sulfate (PROAIR RESPICLICK) 627 (90 Base) MCG/ACT AEPB, Inhale into the lungs., Disp: , Rfl:  .  BABY ASPIRIN PO, Take by mouth daily., Disp: , Rfl:  .  CALCIUM-MAGNESIUM-ZINC PO, Take by mouth., Disp: , Rfl:  .  Insulin Detemir (LEVEMIR FLEXTOUCH) 100 UNIT/ML Pen, Inject 20 Units into the skin at bedtime., Disp: , Rfl:  .  Insulin Pen Needle (PEN NEEDLES) 31G X 5 MM MISC, by Does not apply route., Disp: , Rfl:  .  lisinopril (PRINIVIL,ZESTRIL) 10 MG tablet, Take 10 mg by mouth daily., Disp: , Rfl:  .  Multiple Vitamin (MULTIVITAMIN) tablet, Take 1 tablet by mouth daily., Disp: , Rfl:  .  naproxen sodium (ALEVE) 220 MG tablet, Take 220 mg by mouth 2 (two) times daily as needed., Disp: , Rfl:  .  SitaGLIPtin-MetFORMIN HCl (JANUMET XR) 50-1000 MG TB24, Take 1 tablet by mouth daily., Disp: , Rfl:  .  Gabapentin Enacarbil (HORIZANT) 600 MG TBCR, Take 1 tablet (600 mg total) by mouth 2 (two) times daily., Disp: 60 tablet, Rfl: 1 .  JANUMET XR [phone] MG TB24, TAKE ONE TABLET BY MOUTH DAILY WITH EVENING, Disp: 30 tablet, Rfl: 4 .  pregabalin (LYRICA) 75 MG capsule, Take 75 mg by mouth 2 (two) times daily., Disp: , Rfl:   .  ranitidine (ZANTAC) 150 MG tablet, Take 150 mg by mouth 2 (two) times daily., Disp: , Rfl:    No Known Allergies   Review of Systems  Constitutional: Negative.   Respiratory: Negative.   Cardiovascular: Negative.   Gastrointestinal: Negative.   Neurological: Negative.   Psychiatric/Behavioral: Negative.      Today's Vitals   10/12/18 1522  BP: 110/74  Pulse: 98  Temp: (!) 78 F (25.6 C)  TempSrc: Oral  Weight: 145 lb 9.6 oz (66 kg)  Height: 5' 2.5" (1.588 m)   Body mass index is 26.21 kg/m.   Objective:  Physical Exam Vitals signs and nursing note reviewed.  Constitutional:      Appearance: Normal appearance.  HENT:     Head: Normocephalic and atraumatic.  Cardiovascular:     Rate and Rhythm: Normal rate and regular rhythm.     Heart sounds: Normal heart sounds.  Pulmonary:     Effort: Pulmonary effort is normal.     Breath sounds: Normal breath sounds.  Skin:    General: Skin is warm.  Neurological:     Mental Status: She is alert.  Psychiatric:        Mood and Affect:  Mood normal.         Assessment And Plan:     1. Diabetic mononeuropathy associated with type 2 diabetes mellitus ([place])  She has d/c'd Lyrica. She would like to resume gabapentin. She would like to have a rx sent to the pharmacy today. I will check her labs today. I will make further recommendations once her labs are available for review.   - HIV antibody (with reflex) - BMP8+EGFR - Hemoglobin A1c  2. Hypertensive nephropathy  Well controlled. She will continue with current meds.    3. Chronic renal disease, stage II  Chronic. She is encouraged to stay well hydrated.   [name], MD

## 2018-12-06 ENCOUNTER — Encounter: Payer: Self-pay | Admitting: Internal Medicine

## 2019-01-12 ENCOUNTER — Other Ambulatory Visit: Payer: Self-pay | Admitting: Internal Medicine

## 2019-01-16 ENCOUNTER — Encounter: Payer: Self-pay | Admitting: Internal Medicine

## 2019-01-16 ENCOUNTER — Other Ambulatory Visit: Payer: Self-pay

## 2019-01-16 ENCOUNTER — Other Ambulatory Visit (HOSPITAL_COMMUNITY)
Admission: RE | Admit: 2019-01-16 | Discharge: 2019-01-16 | Disposition: A | Discharge status: Home / Self Care | Payer: 59 | Source: Ambulatory Visit | Attending: Internal Medicine | Admitting: Internal Medicine

## 2019-01-16 ENCOUNTER — Ambulatory Visit (INDEPENDENT_AMBULATORY_CARE_PROVIDER_SITE_OTHER): Payer: 59 | Admitting: Internal Medicine

## 2019-01-16 VITALS — BP 128/74 | HR 76 | Temp 98.1°F | Ht 62.75 in | Wt 140.6 lb

## 2019-01-16 DIAGNOSIS — Z794 Long term (current) use of insulin: Secondary | ICD-10-CM

## 2019-01-16 DIAGNOSIS — Z1211 Encounter for screening for malignant neoplasm of colon: Secondary | ICD-10-CM | POA: Diagnosis not present

## 2019-01-16 DIAGNOSIS — Z0000 Encounter for general adult medical examination without abnormal findings: Secondary | ICD-10-CM | POA: Diagnosis not present

## 2019-01-16 DIAGNOSIS — E2839 Other primary ovarian failure: Secondary | ICD-10-CM

## 2019-01-16 DIAGNOSIS — E1140 Type 2 diabetes mellitus with diabetic neuropathy, unspecified: Secondary | ICD-10-CM | POA: Diagnosis not present

## 2019-01-16 DIAGNOSIS — I129 Hypertensive chronic kidney disease with stage 1 through stage 4 chronic kidney disease, or unspecified chronic kidney disease: Secondary | ICD-10-CM | POA: Diagnosis not present

## 2019-01-16 DIAGNOSIS — Z01419 Encounter for gynecological examination (general) (routine) without abnormal findings: Secondary | ICD-10-CM | POA: Insufficient documentation

## 2019-01-16 DIAGNOSIS — N182 Chronic kidney disease, stage 2 (mild): Secondary | ICD-10-CM | POA: Diagnosis not present

## 2019-01-16 LAB — POCT UA - MICROALBUMIN
Albumin/Creatinine Ratio, Urine, POC: 300
Creatinine, POC: 300 mg/dL
Microalbumin Ur, POC: 80 mg/L

## 2019-01-16 LAB — POCT URINALYSIS DIPSTICK
Bilirubin, UA: NEGATIVE
Blood, UA: NEGATIVE
Glucose, UA: NEGATIVE
Ketones, UA: NEGATIVE
Leukocytes, UA: NEGATIVE
Nitrite, UA: POSITIVE
Protein, UA: POSITIVE — AB
Spec Grav, UA: 1.025 (ref 1.010–1.025)
Urobilinogen, UA: 0.2 E.U./dL
pH, UA: 5.5 (ref 5.0–8.0)

## 2019-01-16 LAB — POC HEMOCCULT BLD/STL (OFFICE/1-CARD/DIAGNOSTIC)
Card #1 Date: 6302020
Fecal Occult Blood, POC: NEGATIVE

## 2019-01-16 MED ORDER — GLUCOSE BLOOD VI STRP: ORAL_STRIP | 11 refills | Status: DC

## 2019-01-16 NOTE — Progress Notes (Signed)
Subjective:     Patient ID: Heather Tanner , female    DOB: 11-21-1955 , 63 y.o.   MRN: [id number]   Chief Complaint  Patient presents with  . Annual Exam  . Diabetes  . Hypertension    HPI  She is here today for a full physical exam. She is no longer followed by GYN. She would like to have a pap smear performed today. She has no specific concerns or complaints. She does add that her insurance [employer] will no longer cover Levemir. She must switch to another agent.   Diabetes  She presents for her follow-up diabetic visit. She has type 2 diabetes mellitus. Her disease course has been stable. There are no hypoglycemic associated symptoms. Pertinent negatives for diabetes include no blurred vision and no chest pain. There are no hypoglycemic complications. Symptoms are stable. Risk factors for coronary artery disease include diabetes mellitus, dyslipidemia, hypertension, post-menopausal and sedentary lifestyle. She is following a diabetic diet. She participates in exercise intermittently. Her home blood glucose trend is fluctuating minimally. Her breakfast blood glucose is taken between 8-9 am. Her breakfast blood glucose range is generally 110-130 mg/dl. An ACE inhibitor/angiotensin II receptor blocker is being taken. Eye exam is current.  Hypertension  This is a chronic problem. The current episode started more than 1 year ago. The problem has been gradually improving since onset. The problem is controlled. Pertinent negatives include no blurred vision, chest pain, palpitations or shortness of breath.     Past Medical History:  Diagnosis Date  . Carpal tunnel syndrome 01/01/2016   right     Family History  Problem Relation Age of Onset  . Diabetes Mother   . Heart Problems Mother   . Hypertension Mother   . Diabetes Father   . Hypertension Father   . Heart Problems Father   . Hypertension Sister   . Stroke Sister   . Hyperlipidemia Sister   . Heart Problems Brother       Current Outpatient Medications:  .  Albuterol Sulfate (PROAIR RESPICLICK) 778 (90 Base) MCG/ACT AEPB, Inhale into the lungs., Disp: , Rfl:  .  BABY ASPIRIN PO, Take by mouth daily., Disp: , Rfl:  .  CALCIUM-MAGNESIUM-ZINC PO, Take by mouth., Disp: , Rfl:  .  Insulin Detemir (LEVEMIR FLEXTOUCH) 100 UNIT/ML Pen, Inject 20 Units into the skin at bedtime., Disp: , Rfl:  .  Insulin Pen Needle (PEN NEEDLES) 31G X 5 MM MISC, by Does not apply route., Disp: , Rfl:  .  JANUMET XR [phone] MG TB24, TAKE ONE TABLET BY MOUTH DAILY WITH EVENING, Disp: 30 tablet, Rfl: 4 .  lisinopril (PRINIVIL,ZESTRIL) 10 MG tablet, TAKE ONE TABLET BY MOUTH DAILY, Disp: 90 tablet, Rfl: 1 .  Multiple Vitamin (MULTIVITAMIN) tablet, Take 1 tablet by mouth daily., Disp: , Rfl:  .  naproxen sodium (ALEVE) 220 MG tablet, Take 220 mg by mouth 2 (two) times daily as needed., Disp: , Rfl:  .  ranitidine (ZANTAC) 150 MG tablet, Take 150 mg by mouth 2 (two) times daily., Disp: , Rfl:  .  Gabapentin Enacarbil (HORIZANT) 600 MG TBCR, Take 1 tablet (600 mg total) by mouth 2 (two) times daily. (Patient not taking: Reported on 01/16/2019), Disp: 60 tablet, Rfl: 1 .  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  pregabalin (LYRICA) 75 MG capsule, Take 75 mg by mouth  2 (two) times daily., Disp: , Rfl:    No Known Allergies    The patient states she uses post menopausal status for birth control. Last LMP was No LMP recorded. Patient is postmenopausal.. Negative for: breast discharge, breast lump(s), breast pain and breast self exam. Associated symptoms include abnormal vaginal bleeding. Pertinent negatives include abnormal bleeding (hematology), anxiety, decreased libido, depression, difficulty falling sleep, dyspareunia, history of infertility, nocturia, sexual dysfunction, sleep disturbances, urinary incontinence, urinary urgency,  vaginal discharge and vaginal itching. Diet regular.The patient states her exercise level is  intermittent, minimal.   . The patient's tobacco use is:  Social History   Tobacco Use  Smoking Status Never Smoker  Smokeless Tobacco Never Used  . She has been exposed to passive smoke. The patient's alcohol use is:  Social History   Substance and Sexual Activity  Alcohol Use Yes   Comment: 3 drinks monthly  . Additional information: Last pap 2016, next one scheduled for TODAY.   Review of Systems  Constitutional: Negative.   HENT: Negative.   Eyes: Negative.  Negative for blurred vision.  Respiratory: Negative.  Negative for shortness of breath.   Cardiovascular: Negative.  Negative for chest pain and palpitations.  Gastrointestinal: Negative.   Endocrine: Negative.   Genitourinary: Negative.   Musculoskeletal: Negative.   Skin: Negative.   Allergic/Immunologic: Negative.   Neurological: Negative.   Hematological: Negative.   Psychiatric/Behavioral: Negative.      Today's Vitals   01/16/19 0903  BP: 128/74  Pulse: 76  Temp: 98.1 F (36.7 C)  TempSrc: Oral  Weight: 140 lb 9.6 oz (63.8 kg)  Height: 5' 2.75" (1.594 m)   Body mass index is 25.11 kg/m.   Objective:  Physical Exam Vitals signs and nursing note reviewed. Exam conducted with a chaperone present.  Constitutional:      Appearance: Normal appearance.  HENT:     Head: Normocephalic and atraumatic.     Right Ear: Tympanic membrane, ear canal and external ear normal.     Left Ear: Tympanic membrane, ear canal and external ear normal.     Nose: Nose normal.     Mouth/Throat:     Mouth: Mucous membranes are moist.     Pharynx: Oropharynx is clear.  Eyes:     Extraocular Movements: Extraocular movements intact.     Conjunctiva/sclera: Conjunctivae normal.     Pupils: Pupils are equal, round, and reactive to light.  Neck:     Musculoskeletal: Normal range of motion and neck supple.  Cardiovascular:     Rate  and Rhythm: Normal rate and regular rhythm.     Pulses: Normal pulses.          Dorsalis pedis pulses are 2+ on the right side and 2+ on the left side.       Posterior tibial pulses are 2+ on the right side and 2+ on the left side.     Heart sounds: Normal heart sounds.  Pulmonary:     Effort: Pulmonary effort is normal.     Breath sounds: Normal breath sounds.  Chest:     Breasts:        Right: Normal. No swelling, bleeding, inverted nipple, mass or nipple discharge.        Left: Normal. No swelling, bleeding, inverted nipple, mass or nipple discharge.  Abdominal:     General: Abdomen is flat. Bowel sounds are normal.     Palpations: Abdomen is soft.     Hernia: There is no  hernia in the right inguinal area or left inguinal area.  Genitourinary:    General: Normal vulva.     Exam position: Lithotomy position.     Vagina: Normal. No vaginal discharge.     Cervix: Normal.     Uterus: Normal.      Adnexa: Right adnexa normal and left adnexa normal.     Rectum: Normal. Guaiac result negative.     Comments: deferred Musculoskeletal: Normal range of motion.     Right foot: Normal range of motion. No deformity.     Left foot: Normal range of motion. No deformity.  Feet:     Right foot:     Protective Sensation: 5 sites tested. 5 sites sensed.     Skin integrity: Skin integrity normal.     Left foot:     Protective Sensation: 5 sites tested. 5 sites sensed.     Skin integrity: Skin integrity normal.  Lymphadenopathy:     Lower Body: No right inguinal adenopathy. No left inguinal adenopathy.  Skin:    General: Skin is warm and dry.  Neurological:     General: No focal deficit present.     Mental Status: She is alert and oriented to person, place, and time.  Psychiatric:        Mood and Affect: Mood normal.        Behavior: Behavior normal.         Assessment And Plan:     1. Routine general medical examination at health care facility  A full exam was performed. Importance  of monthly self breast exams was discussed with the patient. PATIENT HAS BEEN ADVISED TO GET 30-45 MINUTES REGULAR EXERCISE NO LESS THAN FOUR TO FIVE DAYS PER WEEK - BOTH WEIGHTBEARING EXERCISES AND AEROBIC ARE RECOMMENDED.  SHE IS ADVISED TO FOLLOW A HEALTHY DIET WITH AT LEAST SIX FRUITS/VEGGIES PER DAY, DECREASE INTAKE OF RED MEAT, AND TO INCREASE FISH INTAKE TO TWO DAYS PER WEEK.  MEATS/FISH SHOULD NOT BE FRIED, BAKED OR BROILED IS PREFERABLE.  I SUGGEST WEARING SPF 50 SUNSCREEN ON EXPOSED PARTS AND ESPECIALLY WHEN IN THE DIRECT SUNLIGHT FOR AN EXTENDED PERIOD OF TIME.  PLEASE AVOID FAST FOOD RESTAURANTS AND INCREASE YOUR WATER INTAKE.  - CMP14+EGFR - CBC - Lipid panel - Hemoglobin A1c  2. Pap smear, as part of routine gynecological examination  Pap smear performed, stool is heme negative. HPV testing was also performed.   - Cytology -Pap Smear - POC Hemoccult Bld/Stl (1-Cd Office Dx)  3. Type 2 diabetes mellitus with diabetic neuropathy, with long-term current use of insulin ([place])  Diabetic foot exam was performed. I DISCUSSED WITH THE PATIENT AT LENGTH REGARDING THE GOALS OF GLYCEMIC CONTROL AND POSSIBLE LONG-TERM COMPLICATIONS.  I  ALSO STRESSED THE IMPORTANCE OF COMPLIANCE WITH HOME GLUCOSE MONITORING, DIETARY RESTRICTIONS INCLUDING AVOIDANCE OF SUGARY DRINKS/PROCESSED FOODS,  ALONG WITH REGULAR EXERCISE.  I  ALSO STRESSED THE IMPORTANCE OF ANNUAL EYE EXAMS, SELF FOOT CARE AND COMPLIANCE WITH OFFICE VISITS.  - POCT Urinalysis Dipstick (81002) - POCT UA - Microalbumin  4. Hypertensive nephropathy  Well controlled. She will continue with current meds. She is encouraged to avoid adding salt to her foods. EKG performed, NSR w/o acute changes.   - EKG 12-Lead  5. Estrogen deficiency  I will schedule her for bone density to be performed with her next mammogram. Importance of engaging in weight-bearing exercises along with vitamin D/calcium supplementation was discussed with the patient.    - DG Bone  Density; Future    [name], MD    THE PATIENT IS ENCOURAGED TO PRACTICE SOCIAL DISTANCING DUE TO THE COVID-19 PANDEMIC.

## 2019-01-16 NOTE — Patient Instructions (Signed)

## 2019-01-17 LAB — CMP14+EGFR
ALT: 15 IU/L (ref 0–32)
AST: 24 IU/L (ref 0–40)
Albumin/Globulin Ratio: 1.8 (ref 1.2–2.2)
Albumin: 4.6 g/dL (ref 3.8–4.8)
Alkaline Phosphatase: 41 IU/L (ref 39–117)
BUN/Creatinine Ratio: 20 (ref 12–28)
BUN: 24 mg/dL (ref 8–27)
Bilirubin Total: <0.2 mg/dL (ref 0.0–1.2)
CO2: 23 mmol/L (ref 20–29)
Calcium: 10 mg/dL (ref 8.7–10.3)
Chloride: 101 mmol/L (ref 96–106)
Creatinine, Ser: 1.18 mg/dL — ABNORMAL HIGH (ref 0.57–1.00)
GFR calc Af Amer: 56 mL/min/{1.73_m2} — ABNORMAL LOW (ref >59)
GFR calc non Af Amer: 49 mL/min/{1.73_m2} — ABNORMAL LOW (ref >59)
Globulin, Total: 2.5 g/dL (ref 1.5–4.5)
Glucose: 75 mg/dL (ref 65–99)
Potassium: 4 mmol/L (ref 3.5–5.2)
Sodium: 140 mmol/L (ref 134–144)
Total Protein: 7.1 g/dL (ref 6.0–8.5)

## 2019-01-17 LAB — CBC
Hematocrit: 33.2 % — ABNORMAL LOW (ref 34.0–46.6)
Hemoglobin: 10.8 g/dL — ABNORMAL LOW (ref 11.1–15.9)
MCH: 27.3 pg (ref 26.6–33.0)
MCHC: 32.5 g/dL (ref 31.5–35.7)
MCV: 84 fL (ref 79–97)
Platelets: 252 10*3/uL (ref 150–450)
RBC: 3.95 x10E6/uL (ref 3.77–5.28)
RDW: 13.2 % (ref 11.7–15.4)
WBC: 5.9 10*3/uL (ref 3.4–10.8)

## 2019-01-17 LAB — LIPID PANEL
Chol/HDL Ratio: 3.2 ratio (ref 0.0–4.4)
Cholesterol, Total: 212 mg/dL — ABNORMAL HIGH (ref 100–199)
HDL: 67 mg/dL (ref >39)
LDL Calculated: 128 mg/dL — ABNORMAL HIGH (ref 0–99)
Triglycerides: 83 mg/dL (ref 0–149)
VLDL Cholesterol Cal: 17 mg/dL (ref 5–40)

## 2019-01-17 LAB — HEMOGLOBIN A1C
Est. average glucose Bld gHb Est-mCnc: 117 mg/dL
Hgb A1c MFr Bld: 5.7 % — ABNORMAL HIGH (ref 4.8–5.6)

## 2019-01-19 LAB — SPECIMEN STATUS REPORT

## 2019-01-19 LAB — PROTEIN ELECTROPHORESIS
A/G Ratio: 1.1 (ref 0.7–1.7)
Albumin ELP: 3.9 g/dL (ref 2.9–4.4)
Alpha 1: 0.2 g/dL (ref 0.0–0.4)
Alpha 2: 0.9 g/dL (ref 0.4–1.0)
Beta: 1.1 g/dL (ref 0.7–1.3)
Gamma Globulin: 1.2 g/dL (ref 0.4–1.8)
Globulin, Total: 3.4 g/dL (ref 2.2–3.9)
Total Protein: 7.3 g/dL (ref 6.0–8.5)

## 2019-01-19 LAB — PHOSPHORUS: Phosphorus: 4.1 mg/dL (ref 3.0–4.3)

## 2019-01-20 LAB — CYTOLOGY - PAP
Diagnosis: NEGATIVE
HPV: NOT DETECTED

## 2019-02-13 ENCOUNTER — Encounter: Payer: Self-pay | Admitting: Internal Medicine

## 2019-02-13 LAB — HM DIABETES EYE EXAM

## 2019-02-14 ENCOUNTER — Encounter: Payer: Self-pay | Admitting: Internal Medicine

## 2019-02-16 ENCOUNTER — Encounter: Payer: Self-pay | Admitting: Internal Medicine

## 2019-03-12 ENCOUNTER — Other Ambulatory Visit: Payer: Self-pay

## 2019-03-12 MED ORDER — INSULIN GLARGINE (1 UNIT DIAL) 300 UNIT/ML ~~LOC~~ SOPN: 20.0000 [IU] | PEN_INJECTOR | Freq: Every day | SUBCUTANEOUS | 2 refills | Status: DC

## 2019-03-13 ENCOUNTER — Telehealth: Payer: Self-pay

## 2019-03-13 NOTE — Telephone Encounter (Signed)
PA FOR TOUJEO SENT TO PLAN

## 2019-03-14 ENCOUNTER — Other Ambulatory Visit: Payer: Self-pay | Admitting: Internal Medicine

## 2019-03-16 ENCOUNTER — Telehealth: Payer: Self-pay

## 2019-03-16 NOTE — Telephone Encounter (Signed)
Left message for pt to return call about medications (prior auth determination)

## 2019-03-16 NOTE — Telephone Encounter (Signed)
I called patient to let her know the PA for toujeo was denied and we will have to speak with Dr.Sanders to see whats the next steps I notified pt once we know we will give [place] a call and update her. YRL,RMA

## 2019-03-23 ENCOUNTER — Telehealth: Payer: Self-pay

## 2019-03-23 NOTE — Telephone Encounter (Signed)
I CALLED PT TO SEE IF SHE DOES NOT LIKE THE TRESIBA SHE STATED SHE DOES ITS JUST HER INSURANCE WILL NOT PAY FOR IT. Heather Tanner

## 2019-03-27 NOTE — Telephone Encounter (Signed)
Please check to see what is needed to get Heather Tanner paid for. Please give her a sample if one is available.

## 2019-04-03 ENCOUNTER — Other Ambulatory Visit: Payer: Self-pay

## 2019-04-03 DIAGNOSIS — E1140 Type 2 diabetes mellitus with diabetic neuropathy, unspecified: Secondary | ICD-10-CM

## 2019-04-03 DIAGNOSIS — Z794 Long term (current) use of insulin: Secondary | ICD-10-CM

## 2019-04-03 MED ORDER — TRESIBA FLEXTOUCH 100 UNIT/ML ~~LOC~~ SOPN: 20.0000 [IU] | PEN_INJECTOR | Freq: Every day | SUBCUTANEOUS | 3 refills | Status: DC

## 2019-04-03 MED ORDER — INSULIN DEGLUDEC 100 UNIT/ML ~~LOC~~ SOPN: 20.0000 [IU] | PEN_INJECTOR | Freq: Every day | SUBCUTANEOUS | Status: DC

## 2019-04-03 MED ORDER — PEN NEEDLES 31G X 5 MM MISC: 1.0000 | Freq: Every day | 2 refills | Status: DC

## 2019-04-19 ENCOUNTER — Ambulatory Visit: Payer: 59 | Admitting: Internal Medicine

## 2019-04-19 ENCOUNTER — Other Ambulatory Visit: Payer: Self-pay | Admitting: Internal Medicine

## 2019-04-19 DIAGNOSIS — Z1231 Encounter for screening mammogram for malignant neoplasm of breast: Secondary | ICD-10-CM

## 2019-05-22 ENCOUNTER — Other Ambulatory Visit: Payer: Self-pay | Admitting: Internal Medicine

## 2019-05-22 DIAGNOSIS — Z20822 Contact with and (suspected) exposure to covid-19: Secondary | ICD-10-CM

## 2019-05-23 LAB — NOVEL CORONAVIRUS, NAA: SARS-CoV-2, NAA: NOT DETECTED

## 2019-05-29 ENCOUNTER — Encounter: Payer: Self-pay | Admitting: Internal Medicine

## 2019-05-29 ENCOUNTER — Ambulatory Visit (INDEPENDENT_AMBULATORY_CARE_PROVIDER_SITE_OTHER): Payer: 59 | Admitting: Internal Medicine

## 2019-05-29 ENCOUNTER — Other Ambulatory Visit: Payer: Self-pay

## 2019-05-29 VITALS — BP 124/78 | HR 79 | Temp 98.4°F | Ht 62.75 in | Wt 140.6 lb

## 2019-05-29 DIAGNOSIS — E113552 Type 2 diabetes mellitus with stable proliferative diabetic retinopathy, left eye: Secondary | ICD-10-CM

## 2019-05-29 DIAGNOSIS — I129 Hypertensive chronic kidney disease with stage 1 through stage 4 chronic kidney disease, or unspecified chronic kidney disease: Secondary | ICD-10-CM

## 2019-05-29 DIAGNOSIS — N182 Chronic kidney disease, stage 2 (mild): Secondary | ICD-10-CM

## 2019-05-29 DIAGNOSIS — E1140 Type 2 diabetes mellitus with diabetic neuropathy, unspecified: Secondary | ICD-10-CM | POA: Diagnosis not present

## 2019-05-29 DIAGNOSIS — E663 Overweight: Secondary | ICD-10-CM

## 2019-05-29 DIAGNOSIS — Z794 Long term (current) use of insulin: Secondary | ICD-10-CM

## 2019-05-29 NOTE — Patient Instructions (Signed)
Exercising to Stay Healthy To become healthy and stay healthy, it is recommended that you do moderate-intensity and vigorous-intensity exercise. You can tell that you are exercising at a moderate intensity if your heart starts beating faster and you start breathing faster but can still hold a conversation. You can tell that you are exercising at a vigorous intensity if you are breathing much harder and faster and cannot hold a conversation while exercising. Exercising regularly is important. It has many health benefits, such as:  Improving overall fitness, flexibility, and endurance.  Increasing bone density.  Helping with weight control.  Decreasing body fat.  Increasing muscle strength.  Reducing stress and tension.  Improving overall health. How often should I exercise? Choose an activity that you enjoy, and set realistic goals. Your health care provider can help you make an activity plan that works for you. Exercise regularly as told by your health care provider. This may include:  Doing strength training two times a week, such as: ? Lifting weights. ? Using resistance bands. ? Push-ups. ? Sit-ups. ? Yoga.  Doing a certain intensity of exercise for a given amount of time. Choose from these options: ? A total of 150 minutes of moderate-intensity exercise every week. ? A total of 75 minutes of vigorous-intensity exercise every week. ? A mix of moderate-intensity and vigorous-intensity exercise every week. Children, pregnant women, people who have not exercised regularly, people who are overweight, and older adults may need to talk with a health care provider about what activities are safe to do. If you have a medical condition, be sure to talk with your health care provider before you start a new exercise program. What are some exercise ideas? Moderate-intensity exercise ideas include:  Walking 1 mile (1.6 km) in about 15 minutes.  Biking.  Hiking.  Golfing.  Dancing.   Water aerobics. Vigorous-intensity exercise ideas include:  Walking 4.5 miles (7.2 km) or more in about 1 hour.  Jogging or running 5 miles (8 km) in about 1 hour.  Biking 10 miles (16.1 km) or more in about 1 hour.  Lap swimming.  Roller-skating or in-line skating.  Cross-country skiing.  Vigorous competitive sports, such as football, basketball, and soccer.  Jumping rope.  Aerobic dancing. What are some everyday activities that can help me to get exercise?  Yard work, such as: ? Pushing a lawn mower. ? Raking and bagging leaves.  Washing your car.  Pushing a stroller.  Shoveling snow.  Gardening.  Washing windows or floors. How can I be more active in my day-to-day activities?  Use stairs instead of an elevator.  Take a walk during your lunch break.  If you drive, park your car farther away from your work or school.  If you take public transportation, get off one stop early and walk the rest of the way.  Stand up or walk around during all of your indoor phone calls.  Get up, stretch, and walk around every 30 minutes throughout the day.  Enjoy exercise with a friend. Support to continue exercising will help you keep a regular routine of activity. What guidelines can I follow while exercising?  Before you start a new exercise program, talk with your health care provider.  Do not exercise so much that you hurt yourself, feel dizzy, or get very short of breath.  Wear comfortable clothes and wear shoes with good support.  Drink plenty of water while you exercise to prevent dehydration or heat stroke.  Work out until your breathing   and your heartbeat get faster. Where to find more information  [place] Department of Health and Human Services: www.hhs.gov  Centers for Disease Control and Prevention (CDC): www.cdc.gov Summary  Exercising regularly is important. It will improve your overall fitness, flexibility, and endurance.  Regular exercise also will  improve your overall health. It can help you control your weight, reduce stress, and improve your bone density.  Do not exercise so much that you hurt yourself, feel dizzy, or get very short of breath.  Before you start a new exercise program, talk with your health care provider. This information is not intended to replace advice given to you by your health care provider. Make sure you discuss any questions you have with your health care provider. Document Released: 10/23/2010 Document Revised: 09/02/2017 Document Reviewed: 08/11/2017 Elsevier Patient Education  2020 Elsevier Inc.  

## 2019-05-29 NOTE — Progress Notes (Addendum)
Subjective:     Patient ID: Heather Tanner , female    DOB: 1955/10/15 , 63 y.o.   MRN: [id number]   Chief Complaint  Patient presents with  . Diabetes  . Hypertension    HPI  Diabetes She presents for her follow-up diabetic visit. She has type 2 diabetes mellitus. There are no hypoglycemic associated symptoms. Pertinent negatives for diabetes include no blurred vision and no chest pain. There are no hypoglycemic complications. Diabetic complications include peripheral neuropathy and retinopathy. Risk factors for coronary artery disease include diabetes mellitus, dyslipidemia, hypertension, post-menopausal and sedentary lifestyle. She participates in exercise intermittently. An ACE inhibitor/angiotensin II receptor blocker is being taken.  Hypertension This is a chronic problem. The current episode started more than 1 year ago. The problem has been gradually improving since onset. The problem is controlled. Pertinent negatives include no blurred vision or chest pain. Past treatments include ACE inhibitors. Compliance problems include exercise.  Hypertensive end-organ damage includes retinopathy.     Past Medical History:  Diagnosis Date  . Carpal tunnel syndrome 01/01/2016   right     Family History  Problem Relation Age of Onset  . Diabetes Mother   . Heart Problems Mother   . Hypertension Mother   . Diabetes Father   . Hypertension Father   . Heart Problems Father   . Hypertension Sister   . Stroke Sister   . Hyperlipidemia Sister   . Heart Problems Brother      Current Outpatient Medications:  .  Albuterol Sulfate (PROAIR RESPICLICK) 124 (90 Base) MCG/ACT AEPB, Inhale into the lungs., Disp: , Rfl:  .  BABY ASPIRIN PO, Take by mouth daily., Disp: , Rfl:  .  CALCIUM-MAGNESIUM-ZINC PO, Take by mouth., Disp: , Rfl:  .  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   insulin degludec (TRESIBA FLEXTOUCH) 100 UNIT/ML SOPN FlexTouch Pen, Inject 0.2 mLs (20 Units total) into the skin at bedtime., Disp: 3 pen, Rfl: 3 .  Insulin Glargine, 1 Unit Dial, (TOUJEO SOLOSTAR) 300 UNIT/ML SOPN, Inject 20 Units into the skin at bedtime. Max titration 80 units, Disp: 3 pen, Rfl: 2 .  Insulin Pen Needle (PEN NEEDLES) 31G X 5 MM MISC, Inject 1 each into the skin daily., Disp: 100 each, Rfl: 2 .  JANUMET XR [phone] MG TB24, TAKE ONE TABLET BY MOUTH EVERY EVENING, Disp: 90 tablet, Rfl: 1 .  lisinopril (PRINIVIL,ZESTRIL) 10 MG tablet, TAKE ONE TABLET BY MOUTH DAILY, Disp: 90 tablet, Rfl: 1 .  Multiple Vitamin (MULTIVITAMIN) tablet, Take 1 tablet by mouth daily., Disp: , Rfl:  .  naproxen sodium (ALEVE) 220 MG tablet, Take 220 mg by mouth 2 (two) times daily as needed., Disp: , Rfl:  .  Gabapentin Enacarbil (HORIZANT) 600 MG TBCR, Take 1 tablet (600 mg total) by mouth 2 (two) times daily. (Patient not taking: Reported on 01/16/2019), Disp: 60 tablet, Rfl: 1 .  Insulin Detemir (LEVEMIR FLEXTOUCH) 100 UNIT/ML Pen, Inject 20 Units into the skin at bedtime., Disp: , Rfl:  .  pregabalin (LYRICA) 75 MG capsule, Take 75 mg by mouth 2 (two) times daily., Disp: , Rfl:  .  ranitidine (ZANTAC) 150 MG tablet, Take 150 mg by mouth 2 (two) times daily., Disp: , Rfl:    No Known Allergies   Review of Systems  Constitutional: Negative.   Eyes: Negative for blurred vision.  Respiratory: Negative.   Cardiovascular: Negative.  Negative for chest pain.  Gastrointestinal: Negative.   Neurological: Negative.   Psychiatric/Behavioral: Negative.      Today's Vitals   05/29/19 1159  BP: 124/78  Pulse: 79  Temp: 98.4 F (36.9 C)  TempSrc: Oral  SpO2: 98%  Weight: 140 lb 9.6 oz (63.8 kg)  Height: 5' 2.75" (1.594 m)   Body mass index is 25.11 kg/m.   Objective:  Physical Exam Vitals signs and nursing note reviewed.  Constitutional:      Appearance: Normal appearance.  HENT:     Head:  Normocephalic and atraumatic.  Cardiovascular:     Rate and Rhythm: Normal rate and regular rhythm.     Heart sounds: Normal heart sounds.  Pulmonary:     Effort: Pulmonary effort is normal.     Breath sounds: Normal breath sounds.  Skin:    General: Skin is warm.  Neurological:     General: No focal deficit present.     Mental Status: She is alert.  Psychiatric:        Mood and Affect: Mood normal.        Behavior: Behavior normal.         Assessment And Plan:     1. Type 2 diabetes mellitus with diabetic neuropathy, with long-term current use of insulin ([place])  I will check labs as listed below. She would like to come off of insulin in the future if possible. Pt advised I may have to add another pill to her regimen if we try to wean her off of insulin. I will check an a1c today. I do plan to check a C-peptide at next visit.  She will rto in four months for re-evaluation.   - BMP8+EGFR - Hemoglobin A1c  2. Hypertensive nephropathy  Chronic, well controlled. She will continue with current meds. She is encouraged to avoid adding salt to her foods.   3. 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  Importance of tight BS control was discussed with the patient.   4. Overweight (BMI 25.0-29.9)  Her weight is stable. She is encouraged to incorporate more exercise into her daily routine to help maintain her healthy weight.   5. Chronic renal disease, stage II  Chronic, yet stable. She is encouraged to stay well hydrated.    [name], MD    THE PATIENT IS ENCOURAGED TO PRACTICE SOCIAL DISTANCING DUE TO THE COVID-19 PANDEMIC.

## 2019-05-30 LAB — BMP8+EGFR
BUN/Creatinine Ratio: 21 (ref 12–28)
BUN: 24 mg/dL (ref 8–27)
CO2: 24 mmol/L (ref 20–29)
Calcium: 10.4 mg/dL — ABNORMAL HIGH (ref 8.7–10.3)
Chloride: 103 mmol/L (ref 96–106)
Creatinine, Ser: 1.15 mg/dL — ABNORMAL HIGH (ref 0.57–1.00)
GFR calc Af Amer: 58 mL/min/{1.73_m2} — ABNORMAL LOW (ref >59)
GFR calc non Af Amer: 50 mL/min/{1.73_m2} — ABNORMAL LOW (ref >59)
Glucose: 78 mg/dL (ref 65–99)
Potassium: 4.5 mmol/L (ref 3.5–5.2)
Sodium: 141 mmol/L (ref 134–144)

## 2019-05-30 LAB — HEMOGLOBIN A1C
Est. average glucose Bld gHb Est-mCnc: 120 mg/dL
Hgb A1c MFr Bld: 5.8 % — ABNORMAL HIGH (ref 4.8–5.6)

## 2019-05-31 ENCOUNTER — Encounter: Payer: Self-pay | Admitting: Internal Medicine

## 2019-05-31 ENCOUNTER — Other Ambulatory Visit: Payer: Self-pay

## 2019-06-29 ENCOUNTER — Other Ambulatory Visit: Payer: Self-pay

## 2019-06-29 ENCOUNTER — Ambulatory Visit
Admission: RE | Admit: 2019-06-29 | Discharge: 2019-06-29 | Disposition: A | Discharge status: Home / Self Care | Payer: 59 | Source: Ambulatory Visit | Attending: Internal Medicine | Admitting: Internal Medicine

## 2019-06-29 DIAGNOSIS — Z1231 Encounter for screening mammogram for malignant neoplasm of breast: Secondary | ICD-10-CM

## 2019-06-29 DIAGNOSIS — E2839 Other primary ovarian failure: Secondary | ICD-10-CM

## 2019-06-30 ENCOUNTER — Encounter: Payer: Self-pay | Admitting: Internal Medicine

## 2019-07-02 ENCOUNTER — Other Ambulatory Visit: Payer: Self-pay | Admitting: Internal Medicine

## 2019-07-02 DIAGNOSIS — R928 Other abnormal and inconclusive findings on diagnostic imaging of breast: Secondary | ICD-10-CM

## 2019-07-06 ENCOUNTER — Other Ambulatory Visit: Payer: Self-pay

## 2019-07-06 ENCOUNTER — Encounter: Payer: Self-pay | Admitting: Internal Medicine

## 2019-07-06 ENCOUNTER — Ambulatory Visit
Admission: RE | Admit: 2019-07-06 | Discharge: 2019-07-06 | Disposition: A | Discharge status: Home / Self Care | Payer: 59 | Source: Ambulatory Visit | Attending: Internal Medicine | Admitting: Internal Medicine

## 2019-07-06 DIAGNOSIS — R928 Other abnormal and inconclusive findings on diagnostic imaging of breast: Secondary | ICD-10-CM

## 2019-07-15 ENCOUNTER — Other Ambulatory Visit: Payer: Self-pay | Admitting: Internal Medicine

## 2019-07-27 ENCOUNTER — Encounter: Payer: Self-pay | Admitting: Internal Medicine

## 2019-09-14 ENCOUNTER — Other Ambulatory Visit: Payer: Self-pay | Admitting: Internal Medicine

## 2019-09-18 ENCOUNTER — Other Ambulatory Visit: Payer: Self-pay

## 2019-09-18 ENCOUNTER — Ambulatory Visit: Payer: 59 | Admitting: Internal Medicine

## 2019-09-18 ENCOUNTER — Encounter: Payer: Self-pay | Admitting: Internal Medicine

## 2019-09-18 VITALS — BP 120/74 | HR 84 | Temp 98.2°F | Ht 62.75 in | Wt 144.0 lb

## 2019-09-18 DIAGNOSIS — M65331 Trigger finger, right middle finger: Secondary | ICD-10-CM

## 2019-09-18 DIAGNOSIS — M79641 Pain in right hand: Secondary | ICD-10-CM

## 2019-09-18 DIAGNOSIS — M79642 Pain in left hand: Secondary | ICD-10-CM

## 2019-09-18 DIAGNOSIS — Z794 Long term (current) use of insulin: Secondary | ICD-10-CM

## 2019-09-18 DIAGNOSIS — M5440 Lumbago with sciatica, unspecified side: Secondary | ICD-10-CM

## 2019-09-18 DIAGNOSIS — G8929 Other chronic pain: Secondary | ICD-10-CM

## 2019-09-18 DIAGNOSIS — I129 Hypertensive chronic kidney disease with stage 1 through stage 4 chronic kidney disease, or unspecified chronic kidney disease: Secondary | ICD-10-CM | POA: Diagnosis not present

## 2019-09-18 DIAGNOSIS — N183 Chronic kidney disease, stage 3 unspecified: Secondary | ICD-10-CM | POA: Diagnosis not present

## 2019-09-18 DIAGNOSIS — E1140 Type 2 diabetes mellitus with diabetic neuropathy, unspecified: Secondary | ICD-10-CM

## 2019-09-18 DIAGNOSIS — G5601 Carpal tunnel syndrome, right upper limb: Secondary | ICD-10-CM

## 2019-09-18 MED ORDER — PREGABALIN 100 MG PO CAPS: 100.0000 mg | ORAL_CAPSULE | Freq: Three times a day (TID) | ORAL | 2 refills | Status: DC

## 2019-09-18 NOTE — Progress Notes (Signed)
This visit occurred during the SARS-CoV-2 public health emergency.  Safety protocols were in place, including screening questions prior to the visit, additional usage of staff PPE, and extensive cleaning of exam room while observing appropriate contact time as indicated for disinfecting solutions.  Subjective:     Patient ID: Heather Tanner , female    DOB: 04-17-1956 , 63 y.o.   MRN: [id number]   Chief Complaint  Patient presents with  . Diabetes  . Hypertension    HPI  Diabetes She presents for her follow-up diabetic visit. She has type 2 diabetes mellitus. There are no hypoglycemic associated symptoms. Pertinent negatives for diabetes include no blurred vision and no chest pain. There are no hypoglycemic complications. Diabetic complications include peripheral neuropathy and retinopathy. Risk factors for coronary artery disease include diabetes mellitus, dyslipidemia, hypertension, post-menopausal and sedentary lifestyle. She participates in exercise intermittently. An ACE inhibitor/angiotensin II receptor blocker is being taken.  Hypertension This is a chronic problem. The current episode started more than 1 year ago. The problem has been gradually improving since onset. The problem is controlled. Pertinent negatives include no blurred vision or chest pain. Past treatments include ACE inhibitors. Compliance problems include exercise.  Hypertensive end-organ damage includes retinopathy.     Past Medical History:  Diagnosis Date  . Carpal tunnel syndrome 01/01/2016   right     Family History  Problem Relation Age of Onset  . Diabetes Mother   . Heart Problems Mother   . Hypertension Mother   . Diabetes Father   . Hypertension Father   . Heart Problems Father   . Hypertension Sister   . Stroke Sister   . Hyperlipidemia Sister   . Heart Problems Brother      Current Outpatient Medications:  .  Albuterol Sulfate (PROAIR RESPICLICK) 025 (90 Base) MCG/ACT AEPB, Inhale  into the lungs., Disp: , Rfl:  .  BABY ASPIRIN PO, Take by mouth daily., Disp: , Rfl:  .  CALCIUM-MAGNESIUM-ZINC PO, Take by mouth., Disp: , Rfl:  .  Calcium-Vitamin D-Vitamin K (VIACTIV PO), Take by mouth. 2 per day, Disp: , Rfl:  .  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  Insulin Detemir (LEVEMIR FLEXTOUCH) 100 UNIT/ML Pen, Inject 20 Units into the skin at bedtime., Disp: , Rfl:  .  Insulin Glargine, 1 Unit Dial, (TOUJEO SOLOSTAR) 300 UNIT/ML SOPN, Inject 20 Units into the skin at bedtime. Max titration 80 units, Disp: 3 pen, Rfl: 2 .  Insulin Pen Needle (PEN NEEDLES) 31G X 5 MM MISC, Inject 1 each into the skin daily., Disp: 100 each, Rfl: 2 .  JANUMET XR [id number] MG TB24, TAKE ONE TABLET BY MOUTH EVERY EVENING, Disp: 90 tablet, Rfl: 0 .  lisinopril (ZESTRIL) 10 MG tablet, TAKE ONE TABLET BY MOUTH DAILY, Disp: 90 tablet, Rfl: 1 .  Multiple Vitamin (MULTIVITAMIN) tablet, Take 1 tablet by mouth daily., Disp: , Rfl:  .  naproxen sodium (ALEVE) 220 MG tablet, Take 220 mg by mouth 2 (two) times daily as needed., Disp: , Rfl:  .  ranitidine (ZANTAC) 150 MG tablet, Take 150 mg by mouth 2 (two) times daily., Disp: , Rfl:  .  pregabalin (LYRICA) 100 MG capsule, Take 1 capsule (100 mg total) by mouth 3 (three) times daily., Disp: 90 capsule, Rfl: 2 .  traMADol (ULTRAM) 50 MG tablet, Take 1 tablet (50 mg total)  by mouth every 6 (six) hours as needed., Disp: 20 tablet, Rfl: 0   No Known Allergies   Review of Systems  Constitutional: Negative.   Eyes: Negative for blurred vision.  Respiratory: Negative.   Cardiovascular: Negative.  Negative for chest pain.  Gastrointestinal: Negative.   Musculoskeletal: Positive for arthralgias.       She c/o right wrist pain. Also with right trigger finger. States finger gets stuck often.   Neurological: Negative.   Psychiatric/Behavioral: Negative.       Today's Vitals   09/18/19 1201  BP: 120/74  Pulse: 84  Temp: 98.2 F (36.8 C)  TempSrc: Oral  Weight: 144 lb (65.3 kg)  Height: 5' 2.75" (1.594 m)   Body mass index is 25.71 kg/m.   Objective:  Physical Exam Vitals and nursing note reviewed.  Constitutional:      Appearance: Normal appearance.  HENT:     Head: Normocephalic and atraumatic.  Cardiovascular:     Rate and Rhythm: Normal rate and regular rhythm.     Heart sounds: Normal heart sounds.  Pulmonary:     Effort: Pulmonary effort is normal.     Breath sounds: Normal breath sounds.  Musculoskeletal:     Comments: Neg squeeze test b/l. Neg Tinel's sign. Neg Phalen's sign. She also c/o b/l hand stiffness/discomfort. No h/o RA.   Skin:    General: Skin is warm.  Neurological:     General: No focal deficit present.     Mental Status: She is alert.  Psychiatric:        Mood and Affect: Mood normal.        Behavior: Behavior normal.         Assessment And Plan:     1. Type 2 diabetes mellitus with diabetic neuropathy, with long-term current use of insulin (HCC)  Chronic, she has improved control over the past several months. I will check labs as listed below.  - CMP14+EGFR - Hemoglobin A1c  2. Hypertensive nephropathy  Chronic, well controlled. She will continue with current meds. She is encouraged to avoid adding salt to her foods.   3. Carpal tunnel syndrome of right wrist  I will refer her to hand specialist for further evaluation.  - Ambulatory referral to Hand Surgery  4. Bilateral hand pain  I will check autoimmune arthritis panel. Squeeze test is negative.   - Ambulatory referral to Hand Surgery - ANA, IFA (with reflex) - CYCLIC CITRUL PEPTIDE ANTIBODY, IGG/IGA - Rheumatoid factor - Sedimentation rate - Uric acid  5. Trigger middle finger of right hand  Again, she will benefit from Hand Surgery evaluation.   [name], MD    THE PATIENT IS ENCOURAGED TO PRACTICE SOCIAL  DISTANCING DUE TO THE COVID-19 PANDEMIC.

## 2019-09-21 LAB — CMP14+EGFR
ALT: 15 IU/L (ref 0–32)
AST: 25 IU/L (ref 0–40)
Albumin/Globulin Ratio: 1.8 (ref 1.2–2.2)
Albumin: 4.8 g/dL (ref 3.8–4.8)
Alkaline Phosphatase: 50 IU/L (ref 39–117)
BUN/Creatinine Ratio: 16 (ref 12–28)
BUN: 19 mg/dL (ref 8–27)
Bilirubin Total: <0.2 mg/dL (ref 0.0–1.2)
CO2: 25 mmol/L (ref 20–29)
Calcium: 10.5 mg/dL — ABNORMAL HIGH (ref 8.7–10.3)
Chloride: 103 mmol/L (ref 96–106)
Creatinine, Ser: 1.18 mg/dL — ABNORMAL HIGH (ref 0.57–1.00)
GFR calc Af Amer: 56 mL/min/{1.73_m2} — ABNORMAL LOW (ref >59)
GFR calc non Af Amer: 49 mL/min/{1.73_m2} — ABNORMAL LOW (ref >59)
Globulin, Total: 2.7 g/dL (ref 1.5–4.5)
Glucose: 58 mg/dL — ABNORMAL LOW (ref 65–99)
Potassium: 4.9 mmol/L (ref 3.5–5.2)
Sodium: 142 mmol/L (ref 134–144)
Total Protein: 7.5 g/dL (ref 6.0–8.5)

## 2019-09-21 LAB — HEMOGLOBIN A1C
Est. average glucose Bld gHb Est-mCnc: 123 mg/dL
Hgb A1c MFr Bld: 5.9 % — ABNORMAL HIGH (ref 4.8–5.6)

## 2019-09-21 LAB — SEDIMENTATION RATE: Sed Rate: 38 mm/hr (ref 0–40)

## 2019-09-21 LAB — ANTINUCLEAR ANTIBODIES, IFA: ANA Titer 1: NEGATIVE

## 2019-09-21 LAB — URIC ACID: Uric Acid: 5.1 mg/dL (ref 3.0–7.2)

## 2019-09-21 LAB — CYCLIC CITRUL PEPTIDE ANTIBODY, IGG/IGA: Cyclic Citrullin Peptide Ab: 6 units (ref 0–19)

## 2019-09-21 LAB — RHEUMATOID FACTOR: Rheumatoid fact SerPl-aCnc: <10 IU/mL (ref 0.0–13.9)

## 2019-09-24 ENCOUNTER — Encounter: Payer: Self-pay | Admitting: Internal Medicine

## 2019-09-25 ENCOUNTER — Encounter: Payer: Self-pay | Admitting: Internal Medicine

## 2019-09-25 ENCOUNTER — Other Ambulatory Visit: Payer: Self-pay | Admitting: Internal Medicine

## 2019-09-25 MED ORDER — TRAMADOL HCL 50 MG PO TABS: 50.0000 mg | ORAL_TABLET | Freq: Four times a day (QID) | ORAL | 0 refills | Status: DC | PRN

## 2019-09-27 ENCOUNTER — Encounter: Payer: Self-pay | Admitting: Internal Medicine

## 2019-10-02 NOTE — Patient Instructions (Signed)
Trigger Finger  Trigger finger (stenosing tenosynovitis) is a condition that causes a finger to get stuck in a bent position. Each finger has a tough, cord-like tissue that connects muscle to bone (tendon), and each tendon is surrounded by a tunnel of tissue (tendon sheath). To move your finger, your tendon needs to slide freely through the sheath. Trigger finger happens when the tendon or the sheath thickens, making it difficult to move your finger. Trigger finger can affect any finger or a thumb. It may affect more than one finger. Mild cases may clear up with rest and medicine. Severe cases require more treatment. What are the causes? Trigger finger is caused by a thickened finger tendon or tendon sheath. The cause of this thickening is not known. What increases the risk? The following factors may make you more likely to develop this condition:  Doing activities that require a strong grip.  Having rheumatoid arthritis, gout, or diabetes.  Being 40-60 years old.  Being a woman. What are the signs or symptoms? Symptoms of this condition include:  Pain when bending or straightening your finger.  Tenderness or swelling where your finger attaches to the palm of your hand.  A lump in the palm of your hand or on the inside of your finger.  Hearing a popping sound when you try to straighten your finger.  Feeling a popping, catching, or locking sensation when you try to straighten your finger.  Being unable to straighten your finger. How is this diagnosed? This condition is diagnosed based on your symptoms and a physical exam. How is this treated? This condition may be treated by:  Resting your finger and avoiding activities that make symptoms worse.  Wearing a finger splint to keep your finger in a slightly bent position.  Taking NSAIDs to relieve pain and swelling.  Injecting medicine (steroids) into the tendon sheath to reduce swelling and irritation. Injections may need to be  repeated.  Having surgery to open the tendon sheath. This may be done if other treatments do not work and you cannot straighten your finger. You may need physical therapy after surgery. Follow these instructions at home:   Use moist heat to help reduce pain and swelling as told by your health care provider.  Rest your finger and avoid activities that make pain worse. Return to normal activities as told by your health care provider.  If you have a splint, wear it as told by your health care provider.  Take over-the-counter and prescription medicines only as told by your health care provider.  Keep all follow-up visits as told by your health care provider. This is important. Contact a health care provider if:  Your symptoms are not improving with home care. Summary  Trigger finger (stenosing tenosynovitis) causes your finger to get stuck in a bent position, and it can make it difficult and painful to straighten your finger.  This condition develops when a finger tendon or tendon sheath thickens.  Treatment starts with resting, wearing a splint, and taking NSAIDs.  In severe cases, surgery to open the tendon sheath may be needed. This information is not intended to replace advice given to you by your health care provider. Make sure you discuss any questions you have with your health care provider. Document Released: 07/10/2004 Document Revised: 09/02/2017 Document Reviewed: 08/31/2016 Elsevier Patient Education  2020 Else[employer] Inc.  

## 2019-10-09 ENCOUNTER — Other Ambulatory Visit: Payer: Self-pay | Admitting: Internal Medicine

## 2019-10-09 ENCOUNTER — Telehealth: Payer: Self-pay

## 2019-10-09 MED ORDER — HYDROCODONE-ACETAMINOPHEN 5-325 MG PO TABS: 1.0000 | ORAL_TABLET | Freq: Four times a day (QID) | ORAL | 0 refills | Status: DC | PRN

## 2019-10-09 NOTE — Telephone Encounter (Signed)
The pt was notified that an rx of hydrocodone has been faxed to the pt's pharmacy.

## 2019-10-15 ENCOUNTER — Telehealth: Payer: Self-pay

## 2019-10-15 NOTE — Telephone Encounter (Signed)
The pt was notified that a savings card for Janumet has been placed up front for pickup.

## 2019-10-22 LAB — HM DIABETES EYE EXAM

## 2019-11-15 ENCOUNTER — Encounter: Payer: Self-pay | Admitting: Internal Medicine

## 2019-11-16 DIAGNOSIS — M48061 Spinal stenosis, lumbar region without neurogenic claudication: Secondary | ICD-10-CM | POA: Insufficient documentation

## 2019-11-16 DIAGNOSIS — M5136 Other intervertebral disc degeneration, lumbar region: Secondary | ICD-10-CM | POA: Insufficient documentation

## 2019-12-10 ENCOUNTER — Telehealth: Payer: Self-pay

## 2019-12-10 MED ORDER — PRAVASTATIN SODIUM 40 MG PO TABS: ORAL_TABLET | ORAL | 1 refills | Status: DC

## 2019-12-10 NOTE — Telephone Encounter (Signed)
I left the pt a message at her request that Dr. Baird Cancer is sending in Pravastatin to the pharmacy and the pt is to take it Monday, Wednesday. Friday, Dr. Baird Cancer also wants to know if the pt wants to be referred to a pain clinic because the pt left a message that the Lyrica isn't helping and that she has been taking it 3 times per day.

## 2019-12-11 ENCOUNTER — Telehealth: Payer: Self-pay

## 2019-12-11 NOTE — Telephone Encounter (Signed)
The pt was told that pravastatin 40 mg was faxed to the pt's pharmacy and that Dr. Baird Cancer said to take the medication on Monday, Wednesdays, Friday and does the pt  want to see a pain clinic because the Lyrica isn't helping with her pain.  The pt said yes, that she was sent to see Dr. Nelva Bush a pain specialist that Dr. Alvan Dame sent her to see about 3 weeks ago to get an injection in her back and that she will see someone else if Dr. Baird Cancer wants her too.

## 2019-12-11 NOTE — Telephone Encounter (Signed)
The pt was told that Dr. Baird Cancer said that it's no need for the pt to see a different pain specialist, that the pt can address her pain with the pain specialist.

## 2019-12-11 NOTE — Telephone Encounter (Signed)
No need to see someone else. If she is already being seen by pain specialist, she should address all concerns regarding her pain with him. NO need to see someone else.

## 2019-12-14 ENCOUNTER — Other Ambulatory Visit: Payer: Self-pay | Admitting: Internal Medicine

## 2019-12-22 DIAGNOSIS — M5116 Intervertebral disc disorders with radiculopathy, lumbar region: Secondary | ICD-10-CM | POA: Insufficient documentation

## 2019-12-26 ENCOUNTER — Telehealth: Payer: Self-pay

## 2019-12-26 NOTE — Telephone Encounter (Signed)
The pt said that she was returning a call to the office.  I told the patient that I didn't see a note on who it was and that I would ask around the office to see who it was that called her.

## 2020-01-11 ENCOUNTER — Other Ambulatory Visit: Payer: Self-pay | Admitting: Internal Medicine

## 2020-01-14 ENCOUNTER — Other Ambulatory Visit: Payer: Self-pay | Admitting: Internal Medicine

## 2020-01-17 ENCOUNTER — Other Ambulatory Visit: Payer: Self-pay | Admitting: Internal Medicine

## 2020-01-22 ENCOUNTER — Encounter: Payer: 59 | Admitting: Internal Medicine

## 2020-01-28 ENCOUNTER — Other Ambulatory Visit: Payer: Self-pay

## 2020-01-28 ENCOUNTER — Encounter: Payer: Self-pay | Admitting: Internal Medicine

## 2020-01-28 ENCOUNTER — Ambulatory Visit: Payer: 59 | Admitting: Internal Medicine

## 2020-01-28 VITALS — BP 130/78 | HR 89 | Temp 98.3°F | Ht 63.6 in | Wt 154.0 lb

## 2020-01-28 DIAGNOSIS — Z794 Long term (current) use of insulin: Secondary | ICD-10-CM | POA: Diagnosis not present

## 2020-01-28 DIAGNOSIS — Z0000 Encounter for general adult medical examination without abnormal findings: Secondary | ICD-10-CM | POA: Diagnosis not present

## 2020-01-28 DIAGNOSIS — I129 Hypertensive chronic kidney disease with stage 1 through stage 4 chronic kidney disease, or unspecified chronic kidney disease: Secondary | ICD-10-CM | POA: Diagnosis not present

## 2020-01-28 DIAGNOSIS — E1140 Type 2 diabetes mellitus with diabetic neuropathy, unspecified: Secondary | ICD-10-CM | POA: Diagnosis not present

## 2020-01-28 DIAGNOSIS — M48062 Spinal stenosis, lumbar region with neurogenic claudication: Secondary | ICD-10-CM

## 2020-01-28 MED ORDER — JANUMET XR 100-1000 MG PO TB24: 1.0000 | ORAL_TABLET | Freq: Every evening | ORAL | 5 refills | Status: DC

## 2020-01-28 NOTE — Patient Instructions (Signed)
Health Maintenance, Female Adopting a healthy lifestyle and getting preventive care are important in promoting health and wellness. Ask your health care provider about:  The right schedule for you to have regular tests and exams.  Things you can do on your own to prevent diseases and keep yourself healthy. What should I know about diet, weight, and exercise? Eat a healthy diet   Eat a diet that includes plenty of vegetables, fruits, low-fat dairy products, and lean protein.  Do not eat a lot of foods that are high in solid fats, added sugars, or sodium. Maintain a healthy weight Body mass index (BMI) is used to identify weight problems. It estimates body fat based on height and weight. Your health care provider can help determine your BMI and help you achieve or maintain a healthy weight. Get regular exercise Get regular exercise. This is one of the most important things you can do for your health. Most adults should:  Exercise for at least 150 minutes each week. The exercise should increase your heart rate and make you sweat (moderate-intensity exercise).  Do strengthening exercises at least twice a week. This is in addition to the moderate-intensity exercise.  Spend less time sitting. Even light physical activity can be beneficial. Watch cholesterol and blood lipids Have your blood tested for lipids and cholesterol at 66 years of age, then have this test every 5 years. Have your cholesterol levels checked more often if:  Your lipid or cholesterol levels are high.  You are older than 66 years of age.  You are at high risk for heart disease. What should I know about cancer screening? Depending on your health history and family history, you may need to have cancer screening at various ages. This may include screening for:  Breast cancer.  Cervical cancer.  Colorectal cancer.  Skin cancer.  Lung cancer. What should I know about heart disease, diabetes, and high blood  pressure? Blood pressure and heart disease  High blood pressure causes heart disease and increases the risk of stroke. This is more likely to develop in people who have high blood pressure readings, are of African descent, or are overweight.  Have your blood pressure checked: ? Every 3-5 years if you are 18-39 years of age. ? Every year if you are 40 years old or older. Diabetes Have regular diabetes screenings. This checks your fasting blood sugar level. Have the screening done:  Once every three years after age 40 if you are at a normal weight and have a low risk for diabetes.  More often and at a younger age if you are overweight or have a high risk for diabetes. What should I know about preventing infection? Hepatitis B If you have a higher risk for hepatitis B, you should be screened for this virus. Talk with your health care provider to find out if you are at risk for hepatitis B infection. Hepatitis C Testing is recommended for:  Everyone born from 1945 through 1965.  Anyone with known risk factors for hepatitis C. Sexually transmitted infections (STIs)  Get screened for STIs, including gonorrhea and chlamydia, if: ? You are sexually active and are younger than 66 years of age. ? You are older than 66 years of age and your health care provider tells you that you are at risk for this type of infection. ? Your sexual activity has changed since you were last screened, and you are at increased risk for chlamydia or gonorrhea. Ask your health care provider if   you are at risk.  Ask your health care provider about whether you are at high risk for HIV. Your health care provider may recommend a prescription medicine to help prevent HIV infection. If you choose to take medicine to prevent HIV, you should first get tested for HIV. You should then be tested every 3 months for as long as you are taking the medicine. Pregnancy  If you are about to stop having your period (premenopausal) and  you may become pregnant, seek counseling before you get pregnant.  Take 400 to 800 micrograms (mcg) of folic acid every day if you become pregnant.  Ask for birth control (contraception) if you want to prevent pregnancy. Osteoporosis and menopause Osteoporosis is a disease in which the bones lose minerals and strength with aging. This can result in bone fractures. If you are 65 years old or older, or if you are at risk for osteoporosis and fractures, ask your health care provider if you should:  Be screened for bone loss.  Take a calcium or vitamin D supplement to lower your risk of fractures.  Be given hormone replacement therapy (HRT) to treat symptoms of menopause. Follow these instructions at home: Lifestyle  Do not use any products that contain nicotine or tobacco, such as cigarettes, e-cigarettes, and chewing tobacco. If you need help quitting, ask your health care provider.  Do not use street drugs.  Do not share needles.  Ask your health care provider for help if you need support or information about quitting drugs. Alcohol use  Do not drink alcohol if: ? Your health care provider tells you not to drink. ? You are pregnant, may be pregnant, or are planning to become pregnant.  If you drink alcohol: ? Limit how much you use to 0-1 drink a day. ? Limit intake if you are breastfeeding.  Be aware of how much alcohol is in your drink. In the [place] one drink equals one 12 oz bottle of beer (355 mL), one 5 oz glass of wine (148 mL), or one 1 oz glass of hard liquor (44 mL). General instructions  Schedule regular health, dental, and eye exams.  Stay current with your vaccines.  Tell your health care provider if: ? You often feel depressed. ? You have ever been abused or do not feel safe at home. Summary  Adopting a healthy lifestyle and getting preventive care are important in promoting health and wellness.  Follow your health care provider's instructions about healthy  diet, exercising, and getting tested or screened for diseases.  Follow your health care provider's instructions on monitoring your cholesterol and blood pressure. This information is not intended to replace advice given to you by your health care provider. Make sure you discuss any questions you have with your health care provider. Document Revised: 09/13/2018 Document Reviewed: 09/13/2018 Elsevier Patient Education  2020 [employer] Inc[employer]  

## 2020-01-28 NOTE — Progress Notes (Signed)
This visit occurred during the SARS-CoV-2 public health emergency.  Safety protocols were in place, including screening questions prior to the visit, additional usage of staff PPE, and extensive cleaning of exam room while observing appropriate contact time as indicated for disinfecting solutions.  Subjective:     Patient ID: Heather Tanner , female    DOB: Sep 20, 1956 , 64 y.o.   MRN: [id number]   Chief Complaint  Patient presents with  . Annual Exam  . Diabetes  . Hypertension    HPI  She is here today for a full physical examination. She is no longer followed by GYN.   Diabetes She presents for her follow-up diabetic visit. She has type 2 diabetes mellitus. There are no hypoglycemic associated symptoms. Pertinent negatives for diabetes include no blurred vision and no chest pain. There are no hypoglycemic complications. Diabetic complications include peripheral neuropathy and retinopathy. Risk factors for coronary artery disease include diabetes mellitus, dyslipidemia, hypertension, post-menopausal and sedentary lifestyle. She participates in exercise intermittently. An ACE inhibitor/angiotensin II receptor blocker is being taken.  Hypertension This is a chronic problem. The current episode started more than 1 year ago. The problem has been gradually improving since onset. The problem is controlled. Pertinent negatives include no blurred vision or chest pain. Past treatments include ACE inhibitors. Compliance problems include exercise.  Hypertensive end-organ damage includes retinopathy.     Past Medical History:  Diagnosis Date  . Carpal tunnel syndrome 01/01/2016   right     Family History  Problem Relation Age of Onset  . Diabetes Mother   . Heart Problems Mother   . Hypertension Mother   . Diabetes Father   . Hypertension Father   . Heart Problems Father   . Hypertension Sister   . Stroke Sister   . Hyperlipidemia Sister   . Heart Problems Brother      Current  Outpatient Medications:  .  Albuterol Sulfate (PROAIR RESPICLICK) 782 (90 Base) MCG/ACT AEPB, Inhale into the lungs., Disp: , Rfl:  .  BABY ASPIRIN PO, Take by mouth daily., Disp: , Rfl:  .  CALCIUM-MAGNESIUM-ZINC PO, Take by mouth., Disp: , Rfl:  .  Calcium-Vitamin D-Vitamin K (VIACTIV PO), Take by mouth. 2 per day, Disp: , Rfl:  .  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  HYDROcodone-acetaminophen (NORCO/VICODIN) 5-325 MG tablet, Take 1 tablet by mouth every 6 (six) hours as needed for moderate pain., Disp: 30 tablet, Rfl: 0 .  Insulin Pen Needle (PEN NEEDLES) 31G X 5 MM MISC, Inject 1 each into the skin daily., Disp: 100 each, Rfl: 2 .  JANUMET XR [phone] MG TB24, TAKE ONE TABLET BY MOUTH EVERY EVENING, Disp: 30 tablet, Rfl: 0 .  lisinopril (ZESTRIL) 10 MG tablet, TAKE ONE TABLET BY MOUTH DAILY, Disp: 90 tablet, Rfl: 0 .  Multiple Vitamin (MULTIVITAMIN) tablet, Take 1 tablet by mouth daily., Disp: , Rfl:  .  naproxen sodium (ALEVE) 220 MG tablet, Take 220 mg by mouth 2 (two) times daily as needed., Disp: , Rfl:  .  pravastatin (PRAVACHOL) 40 MG tablet, Take 1 tablet by mouth on Monday, Wednesday, Friday, Disp: 45 tablet, Rfl: 1 .  pregabalin (LYRICA) 100 MG capsule, Take 1 capsule (100 mg total) by mouth 3 (three) times daily., Disp: 90 capsule, Rfl: 2 .  ranitidine (ZANTAC) 150 MG tablet, Take 150 mg by mouth 2 (two)  times daily., Disp: , Rfl:  .  TOUJEO SOLOSTAR 300 UNIT/ML Solostar Pen, INJECT 20 UNITS INTO THE SKIN AT BEDTIME. MAX TITRATION 80 UNITS (Patient taking differently: 16 Units. ), Disp: 4.5 mL, Rfl: 1 .  traMADol (ULTRAM) 50 MG tablet, Take 1 tablet (50 mg total) by mouth every 6 (six) hours as needed., Disp: 20 tablet, Rfl: 0 .  temazepam (RESTORIL) 15 MG capsule, temazepam 15 mg capsule, Disp: , Rfl:    Allergies  Allergen Reactions  . Latex      The patient  states she uses post menopausal status for birth control. Last LMP was No LMP recorded. Patient is postmenopausal.. Negative for Dysmenorrhea . Negative for: breast discharge, breast lump(s), breast pain and breast self exam. Associated symptoms include abnormal vaginal bleeding. Pertinent negatives include abnormal bleeding (hematology), anxiety, decreased libido, depression, difficulty falling sleep, dyspareunia, history of infertility, nocturia, sexual dysfunction, sleep disturbances, urinary incontinence, urinary urgency, vaginal discharge and vaginal itching. Diet regular.The patient states her exercise level is  intermittent.   . The patient's tobacco use is:  Social History   Tobacco Use  Smoking Status Never Smoker  Smokeless Tobacco Never Used  . She has been exposed to passive smoke. The patient's alcohol use is:  Social History   Substance and Sexual Activity  Alcohol Use Yes   Comment: 3 drinks monthly    Review of Systems  Constitutional: Negative.   HENT: Negative.   Eyes: Negative.  Negative for blurred vision.  Respiratory: Negative.   Cardiovascular: Negative.  Negative for chest pain.  Endocrine: Negative.   Genitourinary: Negative.   Musculoskeletal: Positive for back pain.       She has chronic back pain. Has h/o spinal stenosis. Followed by Ortho. She has had one spinal injection which did provide some relief. However, her sx are returning. States she had MRI earlier this year. Back pain radiates to LE.   Skin: Negative.   Allergic/Immunologic: Negative.   Neurological: Negative.   Hematological: Negative.   Psychiatric/Behavioral: Negative.      Today's Vitals   01/28/20 1122  BP: 130/78  Pulse: 89  Temp: 98.3 F (36.8 C)  TempSrc: Oral  Weight: 154 lb (69.9 kg)  Height: 5' 3.6" (1.615 m)   Body mass index is 26.77 kg/m.   Objective:  Physical Exam Vitals and nursing note reviewed.  Constitutional:      Appearance: Normal appearance.  HENT:      Head: Normocephalic and atraumatic.     Right Ear: Tympanic membrane, ear canal and external ear normal.     Left Ear: Tympanic membrane, ear canal and external ear normal.     Nose:     Comments: Deferred, masked    Mouth/Throat:     Comments: Deferred, masked Eyes:     Extraocular Movements: Extraocular movements intact.     Conjunctiva/sclera: Conjunctivae normal.     Pupils: Pupils are equal, round, and reactive to light.  Cardiovascular:     Rate and Rhythm: Normal rate and regular rhythm.     Pulses: Normal pulses.          Dorsalis pedis pulses are 2+ on the right side and 2+ on the left side.     Heart sounds: Normal heart sounds.  Pulmonary:     Effort: Pulmonary effort is normal.     Breath sounds: Normal breath sounds.  Chest:     Breasts: Tanner Score is 5.        Right:  Normal.        Left: Normal.  Abdominal:     General: Abdomen is flat. Bowel sounds are normal.     Palpations: Abdomen is soft.     Comments: Healed surgical scar, RUQ   Genitourinary:    Comments: deferred Musculoskeletal:        General: Normal range of motion.     Cervical back: Normal range of motion and neck supple.  Feet:     Right foot:     Protective Sensation: 5 sites tested. 5 sites sensed.     Skin integrity: Skin integrity normal.     Toenail Condition: Right toenails are normal.     Left foot:     Protective Sensation: 5 sites tested. 5 sites sensed.     Skin integrity: Skin integrity normal.     Toenail Condition: Left toenails are normal.  Skin:    General: Skin is warm and dry.  Neurological:     General: No focal deficit present.     Mental Status: She is alert and oriented to person, place, and time.  Psychiatric:        Mood and Affect: Mood normal.        Behavior: Behavior normal.         Assessment And Plan:     1. Routine general medical examination at health care facility  A full exam was performed.  Importance of monthly self breast exams was discussed  with the patient. PATIENT IS ADVISED TO GET 30-45 MINUTES REGULAR EXERCISE NO LESS THAN FOUR TO FIVE DAYS PER WEEK - BOTH WEIGHTBEARING EXERCISES AND AEROBIC ARE RECOMMENDED.  SHE IS ADVISED TO FOLLOW A HEALTHY DIET WITH AT LEAST SIX FRUITS/VEGGIES PER DAY, DECREASE INTAKE OF RED MEAT, AND TO INCREASE FISH INTAKE TO TWO DAYS PER WEEK.  MEATS/FISH SHOULD NOT BE FRIED, BAKED OR BROILED IS PREFERABLE.  I SUGGEST WEARING SPF 50 SUNSCREEN ON EXPOSED PARTS AND ESPECIALLY WHEN IN THE DIRECT SUNLIGHT FOR AN EXTENDED PERIOD OF TIME.  PLEASE AVOID FAST FOOD RESTAURANTS AND INCREASE YOUR WATER INTAKE.  - CMP14+EGFR - CBC - Lipid panel - Hemoglobin A1c  2. Type 2 diabetes mellitus with diabetic neuropathy, with long-term current use of insulin ([place])  Diabetic foot exam was performed. I DISCUSSED WITH THE PATIENT AT LENGTH REGARDING THE GOALS OF GLYCEMIC CONTROL AND POSSIBLE LONG-TERM COMPLICATIONS.  I  ALSO STRESSED THE IMPORTANCE OF COMPLIANCE WITH HOME GLUCOSE MONITORING, DIETARY RESTRICTIONS INCLUDING AVOIDANCE OF SUGARY DRINKS/PROCESSED FOODS,  ALONG WITH REGULAR EXERCISE.  I  ALSO STRESSED THE IMPORTANCE OF ANNUAL EYE EXAMS, SELF FOOT CARE AND COMPLIANCE WITH OFFICE VISITS.  - POCT Urinalysis Dipstick (81002) - POCT UA - Microalbumin - EKG 12-Lead  3. Hypertensive nephropathy  Chronic, controlled. She will continue with current meds. She is encouraged to avoid adding salt to her foods. EKG performed, no new changes noted.   4. Spinal stenosis of lumbar region with neurogenic claudication  Chronic. Encouraged to f/u with Ortho for pain management.    [name], MD    THE PATIENT IS ENCOURAGED TO PRACTICE SOCIAL DISTANCING DUE TO THE COVID-19 PANDEMIC.

## 2020-01-29 ENCOUNTER — Encounter: Payer: Self-pay | Admitting: Internal Medicine

## 2020-01-29 LAB — LIPID PANEL
Chol/HDL Ratio: 2.5 ratio (ref 0.0–4.4)
Cholesterol, Total: 176 mg/dL (ref 100–199)
HDL: 70 mg/dL (ref >39)
LDL Chol Calc (NIH): 92 mg/dL (ref 0–99)
Triglycerides: 73 mg/dL (ref 0–149)
VLDL Cholesterol Cal: 14 mg/dL (ref 5–40)

## 2020-01-29 LAB — CBC
Hematocrit: 33.5 % — ABNORMAL LOW (ref 34.0–46.6)
Hemoglobin: 10.8 g/dL — ABNORMAL LOW (ref 11.1–15.9)
MCH: 26.7 pg (ref 26.6–33.0)
MCHC: 32.2 g/dL (ref 31.5–35.7)
MCV: 83 fL (ref 79–97)
Platelets: 269 10*3/uL (ref 150–450)
RBC: 4.05 x10E6/uL (ref 3.77–5.28)
RDW: 14.4 % (ref 11.7–15.4)
WBC: 8.8 10*3/uL (ref 3.4–10.8)

## 2020-01-29 LAB — CMP14+EGFR
ALT: 21 IU/L (ref 0–32)
AST: 27 IU/L (ref 0–40)
Albumin/Globulin Ratio: 1.6 (ref 1.2–2.2)
Albumin: 4.3 g/dL (ref 3.8–4.8)
Alkaline Phosphatase: 45 IU/L (ref 39–117)
BUN/Creatinine Ratio: 19 (ref 12–28)
BUN: 22 mg/dL (ref 8–27)
Bilirubin Total: <0.2 mg/dL (ref 0.0–1.2)
CO2: 23 mmol/L (ref 20–29)
Calcium: 9.8 mg/dL (ref 8.7–10.3)
Chloride: 106 mmol/L (ref 96–106)
Creatinine, Ser: 1.18 mg/dL — ABNORMAL HIGH (ref 0.57–1.00)
GFR calc Af Amer: 56 mL/min/{1.73_m2} — ABNORMAL LOW (ref >59)
GFR calc non Af Amer: 48 mL/min/{1.73_m2} — ABNORMAL LOW (ref >59)
Globulin, Total: 2.7 g/dL (ref 1.5–4.5)
Glucose: 97 mg/dL (ref 65–99)
Potassium: 4.7 mmol/L (ref 3.5–5.2)
Sodium: 144 mmol/L (ref 134–144)
Total Protein: 7 g/dL (ref 6.0–8.5)

## 2020-01-29 LAB — HEMOGLOBIN A1C
Est. average glucose Bld gHb Est-mCnc: 140 mg/dL
Hgb A1c MFr Bld: 6.5 % — ABNORMAL HIGH (ref 4.8–5.6)

## 2020-01-30 LAB — POCT UA - MICROALBUMIN
Albumin/Creatinine Ratio, Urine, POC: 30
Creatinine, POC: 300 mg/dL
Microalbumin Ur, POC: 10 mg/L

## 2020-01-30 LAB — POCT URINALYSIS DIPSTICK
Bilirubin, UA: NEGATIVE
Blood, UA: NEGATIVE
Glucose, UA: NEGATIVE
Ketones, UA: NEGATIVE
Leukocytes, UA: NEGATIVE
Nitrite, UA: POSITIVE
Protein, UA: NEGATIVE
Spec Grav, UA: >=1.03 — AB (ref 1.010–1.025)
Urobilinogen, UA: 0.2 E.U./dL
pH, UA: 5 (ref 5.0–8.0)

## 2020-01-31 ENCOUNTER — Encounter: Payer: Self-pay | Admitting: Internal Medicine

## 2020-02-08 ENCOUNTER — Ambulatory Visit: Payer: 59

## 2020-02-09 ENCOUNTER — Other Ambulatory Visit: Payer: Self-pay | Admitting: Internal Medicine

## 2020-02-10 NOTE — Telephone Encounter (Signed)
Lyrica refill last 01/28/20

## 2020-02-19 ENCOUNTER — Encounter: Payer: Self-pay | Admitting: Internal Medicine

## 2020-02-19 ENCOUNTER — Other Ambulatory Visit: Payer: Self-pay | Admitting: Internal Medicine

## 2020-02-19 DIAGNOSIS — N1831 Chronic kidney disease, stage 3a: Secondary | ICD-10-CM

## 2020-02-19 MED ORDER — TRAMADOL HCL 50 MG PO TABS: 50.0000 mg | ORAL_TABLET | Freq: Two times a day (BID) | ORAL | 0 refills | Status: AC | PRN

## 2020-03-12 ENCOUNTER — Other Ambulatory Visit: Payer: Self-pay | Admitting: Internal Medicine

## 2020-03-14 ENCOUNTER — Ambulatory Visit: Payer: 59

## 2020-03-19 DIAGNOSIS — M65331 Trigger finger, right middle finger: Secondary | ICD-10-CM | POA: Insufficient documentation

## 2020-03-19 DIAGNOSIS — M65341 Trigger finger, right ring finger: Secondary | ICD-10-CM | POA: Insufficient documentation

## 2020-03-19 DIAGNOSIS — M79642 Pain in left hand: Secondary | ICD-10-CM | POA: Insufficient documentation

## 2020-03-21 ENCOUNTER — Ambulatory Visit
Admission: RE | Admit: 2020-03-21 | Discharge: 2020-03-21 | Disposition: A | Discharge status: Home / Self Care | Payer: 59 | Source: Ambulatory Visit | Attending: Internal Medicine | Admitting: Internal Medicine

## 2020-03-21 DIAGNOSIS — N1831 Chronic kidney disease, stage 3a: Secondary | ICD-10-CM

## 2020-03-28 ENCOUNTER — Ambulatory Visit (INDEPENDENT_AMBULATORY_CARE_PROVIDER_SITE_OTHER): Payer: 59

## 2020-03-28 DIAGNOSIS — Z23 Encounter for immunization: Secondary | ICD-10-CM

## 2020-03-28 NOTE — Progress Notes (Signed)
   Covid-19 Vaccination Clinic  Name:  Heather Tanner    MRN: [id number] DOB: 18-Oct-1955  03/28/2020  [name] was observed post Covid-19 immunization for 15 minutes without incident. She was provided with Vaccine Information Sheet and instruction to access the V-Safe system.   [name] was instructed to call 911 with any severe reactions post vaccine: Marland Kitchen Difficulty breathing  . Swelling of face and throat  . A fast heartbeat  . A bad rash all over body  . Dizziness and weakness   Immunizations Administered    Name Date Dose VIS Date Route   Moderna COVID-19 Vaccine 03/28/2020 11:32 AM 0.5 mL 09/2019 Intramuscular   Manufacturer: Moderna   Lot: [id number]   [place]: [id number]

## 2020-04-09 ENCOUNTER — Other Ambulatory Visit: Payer: Self-pay | Admitting: Internal Medicine

## 2020-04-25 ENCOUNTER — Ambulatory Visit (INDEPENDENT_AMBULATORY_CARE_PROVIDER_SITE_OTHER): Payer: 59

## 2020-04-25 DIAGNOSIS — Z23 Encounter for immunization: Secondary | ICD-10-CM

## 2020-04-26 ENCOUNTER — Other Ambulatory Visit: Payer: Self-pay | Admitting: Internal Medicine

## 2020-05-09 ENCOUNTER — Other Ambulatory Visit: Payer: Self-pay | Admitting: Internal Medicine

## 2020-05-26 ENCOUNTER — Ambulatory Visit: Payer: 59 | Admitting: Internal Medicine

## 2020-05-26 ENCOUNTER — Encounter: Payer: Self-pay | Admitting: Internal Medicine

## 2020-05-26 ENCOUNTER — Other Ambulatory Visit: Payer: Self-pay

## 2020-05-26 VITALS — BP 112/66 | HR 69 | Temp 98.3°F | Ht 63.6 in | Wt 162.4 lb

## 2020-05-26 DIAGNOSIS — I129 Hypertensive chronic kidney disease with stage 1 through stage 4 chronic kidney disease, or unspecified chronic kidney disease: Secondary | ICD-10-CM | POA: Diagnosis not present

## 2020-05-26 DIAGNOSIS — E1122 Type 2 diabetes mellitus with diabetic chronic kidney disease: Secondary | ICD-10-CM | POA: Diagnosis not present

## 2020-05-26 DIAGNOSIS — N183 Chronic kidney disease, stage 3 unspecified: Secondary | ICD-10-CM

## 2020-05-26 DIAGNOSIS — Z6828 Body mass index (BMI) 28.0-28.9, adult: Secondary | ICD-10-CM

## 2020-05-26 DIAGNOSIS — E113552 Type 2 diabetes mellitus with stable proliferative diabetic retinopathy, left eye: Secondary | ICD-10-CM

## 2020-05-26 DIAGNOSIS — Z794 Long term (current) use of insulin: Secondary | ICD-10-CM

## 2020-05-26 DIAGNOSIS — E663 Overweight: Secondary | ICD-10-CM

## 2020-05-26 MED ORDER — TOUJEO SOLOSTAR 300 UNIT/ML ~~LOC~~ SOPN
16.0000 [IU] | PEN_INJECTOR | Freq: Every day | SUBCUTANEOUS | 3 refills | Status: DC
Start: 2020-05-26 — End: 2021-06-01

## 2020-05-26 NOTE — Progress Notes (Signed)
I,[name],acting as a Education administrator for [name], MD.,have documented all relevant documentation on the behalf of [name], MD,as directed by  [name], MD while in the presence of [name], MD. This visit occurred during the SARS-CoV-2 public health emergency.  Safety protocols were in place, including screening questions prior to the visit, additional usage of staff PPE, and extensive cleaning of exam room while observing appropriate contact time as indicated for disinfecting solutions.  Subjective:     Patient ID: Heather Tanner , female    DOB: 06-29-1956 , 64 y.o.   MRN: [id number]   Chief Complaint  Patient presents with  . Diabetes  . Hypertension    HPI  The patient is here today for a follow-up on her diabetes.  She feels her sugars are going up.  She does admit that her sugars are elevated when she drinks Pepsi.   Diabetes She presents for her follow-up diabetic visit. She has type 2 diabetes mellitus. There are no hypoglycemic associated symptoms. Pertinent negatives for diabetes include no blurred vision. There are no hypoglycemic complications. Diabetic complications include peripheral neuropathy and retinopathy. Risk factors for coronary artery disease include diabetes mellitus, dyslipidemia, hypertension, post-menopausal and sedentary lifestyle. She participates in exercise intermittently. An ACE inhibitor/angiotensin II receptor blocker is being taken.  Hypertension This is a chronic problem. The current episode started more than 1 year ago. The problem has been gradually improving since onset. The problem is controlled. Pertinent negatives include no blurred vision. Past treatments include ACE inhibitors. Compliance problems include exercise.  Hypertensive end-organ damage includes retinopathy.     Past Medical History:  Diagnosis Date  . Carpal tunnel syndrome 01/01/2016   right     Family History  Problem Relation Age of Onset  .  Diabetes Mother   . Heart Problems Mother   . Hypertension Mother   . Diabetes Father   . Hypertension Father   . Heart Problems Father   . Hypertension Sister   . Stroke Sister   . Hyperlipidemia Sister   . Heart Problems Brother      Current Outpatient Medications:  .  Albuterol Sulfate (PROAIR RESPICLICK) 275 (90 Base) MCG/ACT AEPB, Inhale into the lungs., Disp: , Rfl:  .  BABY ASPIRIN PO, Take by mouth daily., Disp: , Rfl:  .  CALCIUM-MAGNESIUM-ZINC PO, Take by mouth., Disp: , Rfl:  .  Calcium-Vitamin D-Vitamin K (VIACTIV PO), Take by mouth. 2 per day, Disp: , Rfl:  .  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  HYDROcodone-acetaminophen (NORCO/VICODIN) 5-325 MG tablet, Take 1 tablet by mouth every 6 (six) hours as needed for moderate pain., Disp: 30 tablet, Rfl: 0 .  insulin glargine, 1 Unit Dial, (TOUJEO SOLOSTAR) 300 UNIT/ML Solostar Pen, Inject 16 Units into the skin at bedtime., Disp: 4.5 mL, Rfl: 3 .  Insulin Pen Needle (PEN NEEDLES) 31G X 5 MM MISC, Inject 1 each into the skin daily., Disp: 100 each, Rfl: 2 .  lisinopril (ZESTRIL) 10 MG tablet, TAKE ONE TABLET BY MOUTH DAILY, Disp: 30 tablet, Rfl: 3 .  Multiple Vitamin (MULTIVITAMIN) tablet, Take 1 tablet by mouth daily., Disp: , Rfl:  .  pravastatin (PRAVACHOL) 40 MG tablet, TAKE ONE TABLET BY MOUTH ON MONDAY, WEDNESDAY AND FRIDAY, Disp: 12 tablet, Rfl: 3 .  pregabalin (LYRICA) 100 MG capsule, TAKE ONE CAPSULE  BY MOUTH THREE TIMES A DAY, Disp: 270 capsule, Rfl: 1 .  ranitidine (ZANTAC) 150 MG tablet, Take 150 mg by mouth 2 (two) times daily., Disp: , Rfl:  .  SitaGLIPtin-MetFORMIN HCl (JANUMET XR) [phone] MG TB24, Take 1 tablet by mouth every evening., Disp: 30 tablet, Rfl: 5 .  temazepam (RESTORIL) 15 MG capsule, temazepam 15 mg capsule, Disp: , Rfl:  .  traMADol (ULTRAM) 50 MG tablet, Take 1 tablet (50 mg total) by  mouth every 12 (twelve) hours as needed for severe pain., Disp: 30 tablet, Rfl: 0   Allergies  Allergen Reactions  . Latex      Review of Systems  Constitutional: Negative.   Eyes: Negative for blurred vision.  Respiratory: Negative.   Gastrointestinal: Negative.   Psychiatric/Behavioral: Negative.   All other systems reviewed and are negative.    Today's Vitals   05/26/20 1144  BP: 112/66  Pulse: 69  Temp: 98.3 F (36.8 C)  TempSrc: Oral  Weight: 162 lb 6.4 oz (73.7 kg)  Height: 5' 3.6" (1.615 m)  PainSc: 10-Worst pain ever  PainLoc: Knee   Body mass index is 28.23 kg/m.  Wt Readings from Last 3 Encounters:  05/26/20 162 lb 6.4 oz (73.7 kg)  01/28/20 154 lb (69.9 kg)  09/18/19 144 lb (65.3 kg)   Objective:  Physical Exam Vitals and nursing note reviewed.  Constitutional:      Appearance: Normal appearance.  HENT:     Head: Normocephalic and atraumatic.  Cardiovascular:     Rate and Rhythm: Normal rate and regular rhythm.     Heart sounds: Normal heart sounds.  Pulmonary:     Breath sounds: Normal breath sounds.  Skin:    General: Skin is warm.  Neurological:     General: No focal deficit present.     Mental Status: She is alert and oriented to person, place, and time.         Assessment And Plan:     1. Type 2 diabetes mellitus with stage 3 chronic kidney disease, with long-term current use of insulin, unspecified whether stage 3a or 3b CKD ([place]) Comments: Chronic, I will check labs as listed below. I will also check renal function.  - Hemoglobin A1c - BMP8+EGFR  2. 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   3. Hypertensive nephropathy Comments: Chronic, well controlled. She will continue with current meds. She is encouraged to avoid adding salt to her foods.   4. Overweight with body mass index (BMI) of 28 to 28.9 in adult Comments: Her  weight is acceptable for her demographic. However, she is encouraged to aim for at least 150 minutes of exercise per week.   The patient was encouraged exercise at least 150 minutes per week to decrease her risk of a cardiac episode.  Patient was given opportunity to ask questions. Patient verbalized understanding of the plan and was able to repeat key elements of the plan. All questions were answered to their satisfaction.  [name], MD   I, [name], MD, have reviewed all documentation for this visit. The documentation on 05/26/20 for the exam, diagnosis, procedures, and orders are all accurate and complete.  THE PATIENT IS ENCOURAGED TO PRACTICE SOCIAL DISTANCING DUE TO THE COVID-19 PANDEMIC.

## 2020-05-26 NOTE — Patient Instructions (Signed)
Diabetes Mellitus and Exercise Exercising regularly is important for your overall health, especially when you have diabetes (diabetes mellitus). Exercising is not only about losing weight. It has many other health benefits, such as increasing muscle strength and bone density and reducing body fat and stress. This leads to improved fitness, flexibility, and endurance, all of which result in better overall health. Exercise has additional benefits for people with diabetes, including:  Reducing appetite.  Helping to lower and control blood glucose.  Lowering blood pressure.  Helping to control amounts of fatty substances (lipids) in the blood, such as cholesterol and triglycerides.  Helping the body to respond better to insulin (improving insulin sensitivity).  Reducing how much insulin the body needs.  Decreasing the risk for heart disease by: ? Lowering cholesterol and triglyceride levels. ? Increasing the levels of good cholesterol. ? Lowering blood glucose levels. What is my activity plan? Your health care provider or certified diabetes educator can help you make a plan for the type and frequency of exercise (activity plan) that works for you. Make sure that you:  Do at least 150 minutes of moderate-intensity or vigorous-intensity exercise each week. This could be brisk walking, biking, or water aerobics. ? Do stretching and strength exercises, such as yoga or weightlifting, at least 2 times a week. ? Spread out your activity over at least 3 days of the week.  Get some form of physical activity every day. ? Do not go more than 2 days in a row without some kind of physical activity. ? Avoid being inactive for more than 30 minutes at a time. Take frequent breaks to walk or stretch.  Choose a type of exercise or activity that you enjoy, and set realistic goals.  Start slowly, and gradually increase the intensity of your exercise over time. What do I need to know about managing my  diabetes?   Check your blood glucose before and after exercising. ? If your blood glucose is 240 mg/dL (13.3 mmol/L) or higher before you exercise, check your urine for ketones. If you have ketones in your urine, do not exercise until your blood glucose returns to normal. ? If your blood glucose is 100 mg/dL (5.6 mmol/L) or lower, eat a snack containing 15-20 grams of carbohydrate. Check your blood glucose 15 minutes after the snack to make sure that your level is above 100 mg/dL (5.6 mmol/L) before you start your exercise.  Know the symptoms of low blood glucose (hypoglycemia) and how to treat it. Your risk for hypoglycemia increases during and after exercise. Common symptoms of hypoglycemia can include: ? Hunger. ? Anxiety. ? Sweating and feeling clammy. ? Confusion. ? Dizziness or feeling light-headed. ? Increased heart rate or palpitations. ? Blurry vision. ? Tingling or numbness around the mouth, lips, or tongue. ? Tremors or shakes. ? Irritability.  Keep a rapid-acting carbohydrate snack available before, during, and after exercise to help prevent or treat hypoglycemia.  Avoid injecting insulin into areas of the body that are going to be exercised. For example, avoid injecting insulin into: ? The arms, when playing tennis. ? The legs, when jogging.  Keep records of your exercise habits. Doing this can help you and your health care provider adjust your diabetes management plan as needed. Write down: ? Food that you eat before and after you exercise. ? Blood glucose levels before and after you exercise. ? The type and amount of exercise you have done. ? When your insulin is expected to peak, if you use   insulin. Avoid exercising at times when your insulin is peaking.  When you start a new exercise or activity, work with your health care provider to make sure the activity is safe for you, and to adjust your insulin, medicines, or food intake as needed.  Drink plenty of water while  you exercise to prevent dehydration or heat stroke. Drink enough fluid to keep your urine clear or pale yellow. Summary  Exercising regularly is important for your overall health, especially when you have diabetes (diabetes mellitus).  Exercising has many health benefits, such as increasing muscle strength and bone density and reducing body fat and stress.  Your health care provider or certified diabetes educator can help you make a plan for the type and frequency of exercise (activity plan) that works for you.  When you start a new exercise or activity, work with your health care provider to make sure the activity is safe for you, and to adjust your insulin, medicines, or food intake as needed. This information is not intended to replace advice given to you by your health care provider. Make sure you discuss any questions you have with your health care provider. Document Revised: 04/14/2017 Document Reviewed: 03/01/2016 Elsevier Patient Education  2020 Elsevier Inc.  

## 2020-05-27 LAB — BMP8+EGFR
BUN/Creatinine Ratio: 19 (ref 12–28)
BUN: 20 mg/dL (ref 8–27)
CO2: 21 mmol/L (ref 20–29)
Calcium: 9.8 mg/dL (ref 8.7–10.3)
Chloride: 106 mmol/L (ref 96–106)
Creatinine, Ser: 1.04 mg/dL — ABNORMAL HIGH (ref 0.57–1.00)
GFR calc Af Amer: 65 mL/min/{1.73_m2} (ref >59)
GFR calc non Af Amer: 56 mL/min/{1.73_m2} — ABNORMAL LOW (ref >59)
Glucose: 93 mg/dL (ref 65–99)
Potassium: 4.3 mmol/L (ref 3.5–5.2)
Sodium: 142 mmol/L (ref 134–144)

## 2020-05-27 LAB — HEMOGLOBIN A1C
Est. average glucose Bld gHb Est-mCnc: 160 mg/dL
Hgb A1c MFr Bld: 7.2 % — ABNORMAL HIGH (ref 4.8–5.6)

## 2020-06-11 ENCOUNTER — Other Ambulatory Visit: Payer: Self-pay | Admitting: Internal Medicine

## 2020-06-11 DIAGNOSIS — Z1231 Encounter for screening mammogram for malignant neoplasm of breast: Secondary | ICD-10-CM

## 2020-07-01 ENCOUNTER — Other Ambulatory Visit: Payer: Self-pay

## 2020-07-01 ENCOUNTER — Ambulatory Visit
Admission: RE | Admit: 2020-07-01 | Discharge: 2020-07-01 | Disposition: A | Discharge status: Home / Self Care | Payer: 59 | Source: Ambulatory Visit | Attending: Internal Medicine | Admitting: Internal Medicine

## 2020-07-01 DIAGNOSIS — Z1231 Encounter for screening mammogram for malignant neoplasm of breast: Secondary | ICD-10-CM

## 2020-07-04 ENCOUNTER — Other Ambulatory Visit: Payer: Self-pay | Admitting: Internal Medicine

## 2020-07-04 DIAGNOSIS — R928 Other abnormal and inconclusive findings on diagnostic imaging of breast: Secondary | ICD-10-CM

## 2020-07-18 ENCOUNTER — Other Ambulatory Visit: Payer: 59

## 2020-07-25 ENCOUNTER — Other Ambulatory Visit: Payer: Self-pay

## 2020-07-25 ENCOUNTER — Ambulatory Visit
Admission: RE | Admit: 2020-07-25 | Discharge: 2020-07-25 | Disposition: A | Discharge status: Home / Self Care | Payer: 59 | Source: Ambulatory Visit | Attending: Internal Medicine | Admitting: Internal Medicine

## 2020-07-25 ENCOUNTER — Ambulatory Visit: Payer: 59

## 2020-07-25 DIAGNOSIS — R928 Other abnormal and inconclusive findings on diagnostic imaging of breast: Secondary | ICD-10-CM

## 2020-08-04 ENCOUNTER — Other Ambulatory Visit: Payer: Self-pay | Admitting: Internal Medicine

## 2020-08-26 ENCOUNTER — Other Ambulatory Visit: Payer: Self-pay

## 2020-08-26 ENCOUNTER — Encounter: Payer: Self-pay | Admitting: Internal Medicine

## 2020-08-26 ENCOUNTER — Ambulatory Visit (INDEPENDENT_AMBULATORY_CARE_PROVIDER_SITE_OTHER): Payer: 59 | Admitting: Internal Medicine

## 2020-08-26 VITALS — BP 118/74 | HR 85 | Temp 98.3°F | Ht 63.6 in | Wt 159.0 lb

## 2020-08-26 DIAGNOSIS — Z794 Long term (current) use of insulin: Secondary | ICD-10-CM

## 2020-08-26 DIAGNOSIS — I129 Hypertensive chronic kidney disease with stage 1 through stage 4 chronic kidney disease, or unspecified chronic kidney disease: Secondary | ICD-10-CM | POA: Diagnosis not present

## 2020-08-26 DIAGNOSIS — N183 Chronic kidney disease, stage 3 unspecified: Secondary | ICD-10-CM | POA: Diagnosis not present

## 2020-08-26 DIAGNOSIS — E1122 Type 2 diabetes mellitus with diabetic chronic kidney disease: Secondary | ICD-10-CM

## 2020-08-26 MED ORDER — LISINOPRIL 10 MG PO TABS
10.0000 mg | ORAL_TABLET | Freq: Every day | ORAL | 2 refills | Status: DC
Start: 2020-08-26 — End: 2021-06-15

## 2020-08-26 NOTE — Patient Instructions (Signed)
Diabetes Mellitus and Foot Care Foot care is an important part of your health, especially when you have diabetes. Diabetes may cause you to have problems because of poor blood flow (circulation) to your feet and legs, which can cause your skin to:  Become thinner and drier.  Break more easily.  Heal more slowly.  Peel and crack. You may also have nerve damage (neuropathy) in your legs and feet, causing decreased feeling in them. This means that you may not notice minor injuries to your feet that could lead to more serious problems. Noticing and addressing any potential problems early is the best way to prevent future foot problems. How to care for your feet Foot hygiene  Wash your feet daily with warm water and mild soap. Do not use hot water. Then, pat your feet and the areas between your toes until they are completely dry. Do not soak your feet as this can dry your skin.  Trim your toenails straight across. Do not dig under them or around the cuticle. File the edges of your nails with an emery board or nail file.  Apply a moisturizing lotion or petroleum jelly to the skin on your feet and to dry, brittle toenails. Use lotion that does not contain alcohol and is unscented. Do not apply lotion between your toes. Shoes and socks  Wear clean socks or stockings every day. Make sure they are not too tight. Do not wear knee-high stockings since they may decrease blood flow to your legs.  Wear shoes that fit properly and have enough cushioning. Always look in your shoes before you put them on to be sure there are no objects inside.  To break in new shoes, wear them for just a few hours a day. This prevents injuries on your feet. Wounds, scrapes, corns, and calluses  Check your feet daily for blisters, cuts, bruises, sores, and redness. If you cannot see the bottom of your feet, use a mirror or ask someone for help.  Do not cut corns or calluses or try to remove them with medicine.  If you  find a minor scrape, cut, or break in the skin on your feet, keep it and the skin around it clean and dry. You may clean these areas with mild soap and water. Do not clean the area with peroxide, alcohol, or iodine.  If you have a wound, scrape, corn, or callus on your foot, look at it several times a day to make sure it is healing and not infected. Check for: ? Redness, swelling, or pain. ? Fluid or blood. ? Warmth. ? Pus or a bad smell. General instructions  Do not cross your legs. This may decrease blood flow to your feet.  Do not use heating pads or hot water bottles on your feet. They may burn your skin. If you have lost feeling in your feet or legs, you may not know this is happening until it is too late.  Protect your feet from hot and cold by wearing shoes, such as at the beach or on hot pavement.  Schedule a complete foot exam at least once a year (annually) or more often if you have foot problems. If you have foot problems, report any cuts, sores, or bruises to your health care provider immediately. Contact a health care provider if:  You have a medical condition that increases your risk of infection and you have any cuts, sores, or bruises on your feet.  You have an injury that is not   healing.  You have redness on your legs or feet.  You feel burning or tingling in your legs or feet.  You have pain or cramps in your legs and feet.  Your legs or feet are numb.  Your feet always feel cold.  You have pain around a toenail. Get help right away if:  You have a wound, scrape, corn, or callus on your foot and: ? You have pain, swelling, or redness that gets worse. ? You have fluid or blood coming from the wound, scrape, corn, or callus. ? Your wound, scrape, corn, or callus feels warm to the touch. ? You have pus or a bad smell coming from the wound, scrape, corn, or callus. ? You have a fever. ? You have a red line going up your leg. Summary  Check your feet every day  for cuts, sores, red spots, swelling, and blisters.  Moisturize feet and legs daily.  Wear shoes that fit properly and have enough cushioning.  If you have foot problems, report any cuts, sores, or bruises to your health care provider immediately.  Schedule a complete foot exam at least once a year (annually) or more often if you have foot problems. This information is not intended to replace advice given to you by your health care provider. Make sure you discuss any questions you have with your health care provider. Document Revised: 06/13/2019 Document Reviewed: 10/22/2016 Elsevier Patient Education  2020 Elsevier Inc.  

## 2020-08-26 NOTE — Progress Notes (Signed)
I,[name],acting as a Education administrator for [name], MD.,have documented all relevant documentation on the behalf of [name], MD,as directed by  [name], MD while in the presence of [name], MD.  This visit occurred during the SARS-CoV-2 public health emergency.  Safety protocols were in place, including screening questions prior to the visit, additional usage of staff PPE, and extensive cleaning of exam room while observing appropriate contact time as indicated for disinfecting solutions.  Subjective:     Patient ID: Heather Tanner , female    DOB: [birth year] , 64 y.o.   MRN: [id number]   Chief Complaint  Patient presents with  . Diabetes  . Hypertension    HPI  The patient is here today for a follow-up on her diabetes. She reports compliance with meds. Admits she has not been checking sugars regularly. Needs test strip refill.  Diabetes She presents for her follow-up diabetic visit. She has type 2 diabetes mellitus. There are no hypoglycemic associated symptoms. Pertinent negatives for diabetes include no blurred vision. There are no hypoglycemic complications. Diabetic complications include peripheral neuropathy and retinopathy. Risk factors for coronary artery disease include diabetes mellitus, dyslipidemia, hypertension, post-menopausal and sedentary lifestyle. She participates in exercise intermittently. An ACE inhibitor/angiotensin II receptor blocker is being taken.  Hypertension This is a chronic problem. The current episode started more than 1 year ago. The problem has been gradually improving since onset. The problem is controlled. Pertinent negatives include no blurred vision. Past treatments include ACE inhibitors. Compliance problems include exercise.  Hypertensive end-organ damage includes retinopathy.     Past Medical History:  Diagnosis Date  . Carpal tunnel syndrome 01/01/2016   right     Family History  Problem Relation Age of  Onset  . Diabetes Mother   . Heart Problems Mother   . Hypertension Mother   . Diabetes Father   . Hypertension Father   . Heart Problems Father   . Hypertension Sister   . Stroke Sister   . Hyperlipidemia Sister   . Heart Problems Brother      Current Outpatient Medications:  .  Albuterol Sulfate (PROAIR RESPICLICK) 824 (90 Base) MCG/ACT AEPB, Inhale into the lungs., Disp: , Rfl:  .  BABY ASPIRIN PO, Take by mouth daily., Disp: , Rfl:  .  CALCIUM-MAGNESIUM-ZINC PO, Take by mouth., Disp: , Rfl:  .  Calcium-Vitamin D-Vitamin K (VIACTIV PO), Take by mouth. 2 per day, Disp: , Rfl:  .  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  insulin glargine, 1 Unit Dial, (TOUJEO SOLOSTAR) 300 UNIT/ML Solostar Pen, Inject 16 Units into the skin at bedtime., Disp: 4.5 mL, Rfl: 3 .  Insulin Pen Needle (PEN NEEDLES) 31G X 5 MM MISC, Inject 1 each into the skin daily., Disp: 100 each, Rfl: 2 .  lisinopril (ZESTRIL) 10 MG tablet, Take 1 tablet (10 mg total) by mouth daily., Disp: 90 tablet, Rfl: 2 .  Multiple Vitamin (MULTIVITAMIN) tablet, Take 1 tablet by mouth daily., Disp: , Rfl:  .  NONFORMULARY OR COMPOUNDED ITEM, Relief factor, Disp: , Rfl:  .  pravastatin (PRAVACHOL) 40 MG tablet, TAKE ONE TABLET BY MOUTH ON MONDAY, WEDNESDAY AND FRIDAY, Disp: 30 tablet, Rfl: 1 .  pregabalin (LYRICA) 100 MG capsule, TAKE ONE CAPSULE BY MOUTH THREE TIMES A DAY, Disp: 270 capsule, Rfl: 1 .  ranitidine (  ZANTAC) 150 MG tablet, Take 150 mg by mouth 2 (two) times daily. As needed, Disp: , Rfl:  .  SitaGLIPtin-MetFORMIN HCl (JANUMET XR) [phone] MG TB24, Take 1 tablet by mouth every evening., Disp: 30 tablet, Rfl: 5 .  temazepam (RESTORIL) 15 MG capsule, temazepam 15 mg capsule, Disp: , Rfl:  .  traMADol (ULTRAM) 50 MG tablet, Take 1 tablet (50 mg total) by mouth every 12 (twelve) hours as needed for severe pain.,  Disp: 30 tablet, Rfl: 0 .  Misc Natural Products (OSTEO BI-FLEX/5-LOXIN ADVANCED PO), Osteo Bi-Flex, Disp: , Rfl:    Allergies  Allergen Reactions  . Latex      Review of Systems  Constitutional: Negative.   Eyes: Negative for blurred vision.  Respiratory: Negative.   Cardiovascular: Negative.   Gastrointestinal: Negative.   Musculoskeletal: Negative.   Psychiatric/Behavioral: Negative.   All other systems reviewed and are negative.    Today's Vitals   08/26/20 1147  BP: 118/74  Pulse: 85  Temp: 98.3 F (36.8 C)  TempSrc: Oral  Weight: 159 lb (72.1 kg)  Height: 5' 3.6" (1.615 m)  PainSc: 10-Worst pain ever  PainLoc: Knee   Body mass index is 27.64 kg/m.  Wt Readings from Last 3 Encounters:  08/26/20 159 lb (72.1 kg)  05/26/20 162 lb 6.4 oz (73.7 kg)  01/28/20 154 lb (69.9 kg)   Objective:  Physical Exam Vitals and nursing note reviewed.  Constitutional:      Appearance: Normal appearance.  HENT:     Head: Normocephalic and atraumatic.  Cardiovascular:     Rate and Rhythm: Normal rate and regular rhythm.     Heart sounds: Normal heart sounds.  Pulmonary:     Effort: Pulmonary effort is normal.     Breath sounds: Normal breath sounds.  Skin:    General: Skin is warm.  Neurological:     General: No focal deficit present.     Mental Status: She is alert.  Psychiatric:        Mood and Affect: Mood normal.        Behavior: Behavior normal.         Assessment And Plan:     1. Type 2 diabetes mellitus with stage 3 chronic kidney disease, with long-term current use of insulin, unspecified whether stage 3a or 3b CKD ([place]) Comments: Chronic. I will check labs as listed below. I will adjust meds as needed. I will refill test strips as well. She will f/u in 3-4 months for re-evaluation.  - CMP14+EGFR - Hemoglobin A1c  2. Hypertensive nephropathy Comments: Chronic, well controlled. She will continue with current meds. Encouraged to follow low sodium diet.       Patient was given opportunity to ask questions. Patient verbalized understanding of the plan and was able to repeat key elements of the plan. All questions were answered to their satisfaction.  [name], MD   I, [name], MD, have reviewed all documentation for this visit. The documentation on 08/31/20 for the exam, diagnosis, procedures, and orders are all accurate and complete.  THE PATIENT IS ENCOURAGED TO PRACTICE SOCIAL DISTANCING DUE TO THE COVID-19 PANDEMIC.

## 2020-08-27 LAB — CMP14+EGFR
ALT: 17 IU/L (ref 0–32)
AST: 21 IU/L (ref 0–40)
Albumin/Globulin Ratio: 1.8 (ref 1.2–2.2)
Albumin: 4.8 g/dL (ref 3.8–4.8)
Alkaline Phosphatase: 54 IU/L (ref 44–121)
BUN/Creatinine Ratio: 14 (ref 12–28)
BUN: 18 mg/dL (ref 8–27)
Bilirubin Total: <0.2 mg/dL (ref 0.0–1.2)
CO2: 24 mmol/L (ref 20–29)
Calcium: 10 mg/dL (ref 8.7–10.3)
Chloride: 104 mmol/L (ref 96–106)
Creatinine, Ser: 1.27 mg/dL — ABNORMAL HIGH (ref 0.57–1.00)
GFR calc Af Amer: 51 mL/min/{1.73_m2} — ABNORMAL LOW (ref >59)
GFR calc non Af Amer: 44 mL/min/{1.73_m2} — ABNORMAL LOW (ref >59)
Globulin, Total: 2.7 g/dL (ref 1.5–4.5)
Glucose: 70 mg/dL (ref 65–99)
Potassium: 5 mmol/L (ref 3.5–5.2)
Sodium: 143 mmol/L (ref 134–144)
Total Protein: 7.5 g/dL (ref 6.0–8.5)

## 2020-08-27 LAB — HEMOGLOBIN A1C
Est. average glucose Bld gHb Est-mCnc: 169 mg/dL
Hgb A1c MFr Bld: 7.5 % — ABNORMAL HIGH (ref 4.8–5.6)

## 2020-09-30 ENCOUNTER — Other Ambulatory Visit: Payer: Self-pay | Admitting: Internal Medicine

## 2020-10-01 ENCOUNTER — Telehealth: Payer: Self-pay

## 2020-10-01 NOTE — Telephone Encounter (Signed)
The pt left a message that she has received a positive covid test result. I was calling the pt to check on her.

## 2020-10-08 ENCOUNTER — Other Ambulatory Visit: Payer: Self-pay | Admitting: Internal Medicine

## 2020-10-08 MED ORDER — PREGABALIN 100 MG PO CAPS
100.0000 mg | ORAL_CAPSULE | Freq: Three times a day (TID) | ORAL | 1 refills | Status: DC
Start: 2020-10-08 — End: 2020-11-04

## 2020-10-14 ENCOUNTER — Other Ambulatory Visit: Payer: Self-pay | Admitting: Internal Medicine

## 2020-10-20 ENCOUNTER — Other Ambulatory Visit: Payer: Self-pay | Admitting: Internal Medicine

## 2020-10-21 ENCOUNTER — Other Ambulatory Visit: Payer: Self-pay | Admitting: Internal Medicine

## 2020-10-24 ENCOUNTER — Telehealth: Payer: Self-pay

## 2020-10-24 NOTE — Telephone Encounter (Signed)
-----   Message from Glendale Chard, MD sent at 10/23/2020  8:55 PM EST ----- Lyrica is in her chart. That is what was sent.   Which medication does she want?  ----- Message ----- From: Michelle Nasuti, CMA Sent: 10/22/2020   3:59 PM EST To: Glendale Chard, MD  Gabapentin refill to [facility].  The rx never was received by the pharmacy on the 5th.

## 2020-10-30 ENCOUNTER — Other Ambulatory Visit: Payer: Self-pay

## 2020-10-30 MED ORDER — GLUCOSE BLOOD VI STRP: ORAL_STRIP | 2 refills | Status: DC

## 2020-10-31 ENCOUNTER — Other Ambulatory Visit: Payer: Self-pay | Admitting: Internal Medicine

## 2020-11-04 ENCOUNTER — Other Ambulatory Visit: Payer: Self-pay | Admitting: Nurse Practitioner

## 2020-11-04 MED ORDER — PREGABALIN 100 MG PO CAPS
100.0000 mg | ORAL_CAPSULE | Freq: Three times a day (TID) | ORAL | 1 refills | Status: DC
Start: 2020-11-04 — End: 2021-06-24

## 2020-11-05 ENCOUNTER — Other Ambulatory Visit: Payer: Self-pay

## 2020-11-05 MED ORDER — PRAVASTATIN SODIUM 40 MG PO TABS: ORAL_TABLET | ORAL | 2 refills | Status: DC

## 2020-11-19 ENCOUNTER — Other Ambulatory Visit: Payer: Self-pay | Admitting: Internal Medicine

## 2020-11-20 ENCOUNTER — Other Ambulatory Visit: Payer: Self-pay

## 2020-11-20 MED ORDER — ONETOUCH ULTRA MINI W/DEVICE KIT: PACK | 1 refills | Status: DC

## 2020-12-05 LAB — HM DIABETES EYE EXAM

## 2020-12-09 ENCOUNTER — Ambulatory Visit: Payer: 59 | Admitting: Internal Medicine

## 2020-12-09 ENCOUNTER — Encounter: Payer: Self-pay | Admitting: Internal Medicine

## 2020-12-09 ENCOUNTER — Other Ambulatory Visit: Payer: Self-pay

## 2020-12-09 VITALS — BP 132/78 | HR 86 | Temp 98.1°F | Ht 63.6 in | Wt 156.6 lb

## 2020-12-09 DIAGNOSIS — N183 Chronic kidney disease, stage 3 unspecified: Secondary | ICD-10-CM | POA: Diagnosis not present

## 2020-12-09 DIAGNOSIS — E1122 Type 2 diabetes mellitus with diabetic chronic kidney disease: Secondary | ICD-10-CM

## 2020-12-09 DIAGNOSIS — M25561 Pain in right knee: Secondary | ICD-10-CM

## 2020-12-09 DIAGNOSIS — I129 Hypertensive chronic kidney disease with stage 1 through stage 4 chronic kidney disease, or unspecified chronic kidney disease: Secondary | ICD-10-CM | POA: Diagnosis not present

## 2020-12-09 DIAGNOSIS — G8929 Other chronic pain: Secondary | ICD-10-CM

## 2020-12-09 DIAGNOSIS — Z794 Long term (current) use of insulin: Secondary | ICD-10-CM

## 2020-12-09 DIAGNOSIS — E113552 Type 2 diabetes mellitus with stable proliferative diabetic retinopathy, left eye: Secondary | ICD-10-CM | POA: Diagnosis not present

## 2020-12-09 DIAGNOSIS — M79642 Pain in left hand: Secondary | ICD-10-CM

## 2020-12-09 DIAGNOSIS — M79641 Pain in right hand: Secondary | ICD-10-CM

## 2020-12-09 DIAGNOSIS — M25562 Pain in left knee: Secondary | ICD-10-CM

## 2020-12-09 NOTE — Progress Notes (Signed)
I,[name],acting as a Education administrator for [name], MD.,have documented all relevant documentation on the behalf of [name], MD,as directed by  [name], MD while in the presence of [name], MD.  This visit occurred during the SARS-CoV-2 public health emergency.  Safety protocols were in place, including screening questions prior to the visit, additional usage of staff PPE, and extensive cleaning of exam room while observing appropriate contact time as indicated for disinfecting solutions.  Subjective:     Patient ID: Heather Tanner , female    DOB: 03/21/1956 , 65 y.o.   MRN: [id number]   Chief Complaint  Patient presents with   Diabetes   Hypertension    HPI  The patient is here today for a follow-up on her blood pressure and diabetes.   She reports compliance with meds. She denies headaches, chest pain and shortness of breath. She reports her sugars are between 109-278. She admits that higher readings are provoked by sugary beverages/foods.   Diabetes She presents for her follow-up diabetic visit. She has type 2 diabetes mellitus. There are no hypoglycemic associated symptoms. Pertinent negatives for diabetes include no blurred vision. There are no hypoglycemic complications. Diabetic complications include peripheral neuropathy and retinopathy. Risk factors for coronary artery disease include diabetes mellitus, dyslipidemia, hypertension, post-menopausal and sedentary lifestyle. She participates in exercise intermittently. An ACE inhibitor/angiotensin II receptor blocker is being taken.  Hypertension This is a chronic problem. The current episode started more than 1 year ago. The problem has been gradually improving since onset. The problem is controlled. Pertinent negatives include no blurred vision. Past treatments include ACE inhibitors. Compliance problems include exercise.  Hypertensive end-organ damage includes retinopathy.     Past Medical  History:  Diagnosis Date   Carpal tunnel syndrome 01/01/2016   right     Family History  Problem Relation Age of Onset   Diabetes Mother    Heart Problems Mother    Hypertension Mother    Diabetes Father    Hypertension Father    Heart Problems Father    Hypertension Sister    Stroke Sister    Hyperlipidemia Sister    Heart Problems Brother      Current Outpatient Medications:    Albuterol Sulfate (PROAIR RESPICLICK) 353 (90 Base) MCG/ACT AEPB, Inhale into the lungs. 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 2 per day, Disp: , Rfl:    glucose blood test strip, Use as instructed to check blood sugars 3 times per day dx: e11.22, Disp: 100 each, Rfl: 2   insulin glargine, 1 Unit Dial, (TOUJEO SOLOSTAR) 300 UNIT/ML Solostar Pen, Inject 16 Units into the skin at bedtime., Disp: 4.5 mL, Rfl: 3   Insulin Pen Needle (PEN NEEDLES) 31G X 5 MM MISC, Inject 1 each into the skin daily., Disp: 100 each, Rfl: 2   JANUMET XR [phone] MG TB24, TAKE 1 TABLET BY MOUTH EVERY EVENING, Disp: 30 tablet, Rfl: 5   lisinopril (ZESTRIL) 10 MG tablet, Take 1 tablet (10 mg total) by mouth daily., Disp: 90 tablet, Rfl: 2   Misc Natural Products (OSTEO BI-FLEX/5-LOXIN ADVANCED PO),  Osteo Bi-Flex, Disp: , Rfl:    Multiple Vitamin (MULTIVITAMIN) tablet, Take 1 tablet by mouth daily., Disp: , Rfl:    NONFORMULARY OR COMPOUNDED ITEM, Relief factor, Disp: , Rfl:    OVER THE COUNTER MEDICATION, Total beet chew, Disp: , Rfl:    pravastatin (PRAVACHOL) 40 MG tablet, TAKE 1 TABLET BY MOUTH ON MONDAY, WEDNESDAY AND FRIDAY, Disp: 45 tablet, Rfl: 2   pregabalin (LYRICA) 100 MG capsule, Take 1  capsule (100 mg total) by mouth 3 (three) times daily., Disp: 270 capsule, Rfl: 1   ranitidine (ZANTAC) 150 MG tablet, Take 150 mg by mouth 2 (two) times daily. As needed, Disp: , Rfl:    temazepam (RESTORIL) 15 MG capsule, temazepam 15 mg capsule, Disp: , Rfl:    traMADol (ULTRAM) 50 MG tablet, Take 1 tablet (50 mg total) by mouth every 12 (twelve) hours as needed for severe pain., Disp: 30 tablet, Rfl: 0   Allergies  Allergen Reactions   Latex      Review of Systems  Constitutional: Negative.   Eyes: Negative for blurred vision.  Respiratory: Negative.   Cardiovascular: Positive for leg swelling (bilateral from knees down).  Gastrointestinal: Negative.   Musculoskeletal: Positive for arthralgias.       Bilateral knee pain  Psychiatric/Behavioral: Negative.   All other systems reviewed and are negative.    Today's Vitals   12/09/20 1131  BP: 132/78  Pulse: 86  Temp: 98.1 F (36.7 C)  TempSrc: Oral  Weight: 156 lb 9.6 oz (71 kg)  Height: 5' 3.6" (1.615 m)  PainSc: 8   PainLoc: Knee   Body mass index is 27.22 kg/m.  Wt Readings from Last 3 Encounters:  12/09/20 156 lb 9.6 oz (71 kg)  08/26/20 159 lb (72.1 kg)  05/26/20 162 lb 6.4 oz (73.7 kg)   Objective:  Physical Exam Vitals and nursing note reviewed.  Constitutional:      Appearance: Normal appearance. She is obese.  HENT:     Head: Normocephalic and atraumatic.     Nose:     Comments: Masked     Mouth/Throat:     Comments: Masked  Cardiovascular:     Rate and Rhythm: Normal rate and regular rhythm.     Heart sounds: Normal heart sounds.  Pulmonary:     Breath sounds: Normal breath sounds.  Musculoskeletal:        General: Swelling and tenderness present.     Cervical back: Normal range of motion.     Comments: Positive squeeze test right hand   Skin:    General: Skin is warm.  Neurological:     General: No focal deficit present.     Mental Status: She is alert and oriented to person, place, and  time.         Assessment And Plan:     1. Type 2 diabetes mellitus with stage 3 chronic kidney disease, with long-term current use of insulin, unspecified whether stage 3a or 3b CKD ([place]) Comments: Chronic, I will check labs as listed below. I will adjust meds as needed. Importance of dietary compliance was discussed w/ paitent.   2. Hypertensive nephropathy Comments: Chronic, fair control. She is encouraged to avoid adding salt to her foods.   3. 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  - BMP8+EGFR - Hemoglobin A1c  4. Bilateral hand pain Comments: I will check arthritis panel. Squeeze test pos on  the right, she agrees to Rheum referral. - ANA, IFA (with reflex) - CYCLIC CITRUL PEPTIDE ANTIBODY, IGG/IGA - Rheumatoid factor - Ambulatory referral to Rheumatology - Uric acid - Sed Rate (ESR)  5. Chronic pain of both knees Comments: I will check arthritis panel. She is encouraged to follow an anti-inflammaotry diet, free of processed foods which could exacerbate her sx.  - ANA, IFA (with reflex) - CYCLIC CITRUL PEPTIDE ANTIBODY, IGG/IGA - Rheumatoid factor - Uric acid - Sed Rate (ESR)     Patient was given opportunity to ask questions. Patient verbalized understanding of the plan and was able to repeat key elements of the plan. All questions were answered to their satisfaction.   I, [name], MD, have reviewed all documentation for this visit. The documentation on 12/13/20 for the exam, diagnosis, procedures, and orders are all accurate and complete.  THE PATIENT IS ENCOURAGED TO PRACTICE SOCIAL DISTANCING DUE TO THE COVID-19 PANDEMIC.

## 2020-12-09 NOTE — Patient Instructions (Signed)

## 2020-12-10 LAB — SEDIMENTATION RATE: Sed Rate: 42 mm/hr — ABNORMAL HIGH (ref 0–40)

## 2020-12-10 LAB — URIC ACID: Uric Acid: 6.3 mg/dL (ref 3.0–7.2)

## 2020-12-13 LAB — CYCLIC CITRUL PEPTIDE ANTIBODY, IGG/IGA: Cyclic Citrullin Peptide Ab: 7 units (ref 0–19)

## 2020-12-13 LAB — ANTINUCLEAR ANTIBODIES, IFA: ANA Titer 1: NEGATIVE

## 2020-12-13 LAB — HEMOGLOBIN A1C
Est. average glucose Bld gHb Est-mCnc: 148 mg/dL
Hgb A1c MFr Bld: 6.8 % — ABNORMAL HIGH (ref 4.8–5.6)

## 2020-12-13 LAB — BMP8+EGFR
BUN/Creatinine Ratio: 21 (ref 12–28)
BUN: 20 mg/dL (ref 8–27)
CO2: 21 mmol/L (ref 20–29)
Calcium: 10 mg/dL (ref 8.7–10.3)
Chloride: 105 mmol/L (ref 96–106)
Creatinine, Ser: 0.95 mg/dL (ref 0.57–1.00)
Glucose: 89 mg/dL (ref 65–99)
Potassium: 4.5 mmol/L (ref 3.5–5.2)
Sodium: 142 mmol/L (ref 134–144)
eGFR: 66 mL/min/{1.73_m2} (ref >59)

## 2020-12-13 LAB — RHEUMATOID FACTOR: Rheumatoid fact SerPl-aCnc: <10 IU/mL (ref <14.0)

## 2020-12-16 ENCOUNTER — Encounter: Payer: Self-pay | Admitting: Internal Medicine

## 2020-12-17 ENCOUNTER — Other Ambulatory Visit: Payer: Self-pay

## 2020-12-17 MED ORDER — ONETOUCH DELICA LANCETS 33G MISC: 3 refills | Status: DC

## 2021-01-30 ENCOUNTER — Ambulatory Visit: Payer: 59 | Admitting: Internal Medicine

## 2021-01-30 ENCOUNTER — Ambulatory Visit: Payer: Self-pay

## 2021-01-30 ENCOUNTER — Encounter: Payer: Self-pay | Admitting: Internal Medicine

## 2021-01-30 ENCOUNTER — Other Ambulatory Visit: Payer: Self-pay

## 2021-01-30 DIAGNOSIS — M158 Other polyosteoarthritis: Secondary | ICD-10-CM | POA: Diagnosis not present

## 2021-01-30 DIAGNOSIS — M79642 Pain in left hand: Secondary | ICD-10-CM

## 2021-01-30 DIAGNOSIS — M79641 Pain in right hand: Secondary | ICD-10-CM

## 2021-01-30 DIAGNOSIS — M159 Polyosteoarthritis, unspecified: Secondary | ICD-10-CM | POA: Insufficient documentation

## 2021-01-30 NOTE — Progress Notes (Signed)
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 )   History of Present Illness: Heather Tanner is a 65 y.o. female here for evaluation of joint pain in multiple areas and mildly elevated sedimentation rate.  She reports joint pain has progressed gradually since she started noticing it around 3 years ago.  The most affected areas are her hands and knees worse on the right side than on the left. She has not noticed a large amount of swelling usually or redness and warmth in the affected areas. She avoids use of OTC NSAIDs on account of mild kidney disease with history of type 2 diabetes. She finds OA supplement somewhat beneficial containing hyaluronic acid and turmeric.   Labs reviewed RF neg CCP neg ESR 42 Uric acid 6.3 ANA neg  Activities of Daily Living:  Patient reports morning stiffness for 24 hours.   Patient Reports nocturnal pain.  Difficulty dressing/grooming: Denies Difficulty climbing stairs: Reports Difficulty getting out of chair: Reports Difficulty using hands for taps, buttons, cutlery, and/or writing: Reports  Review of Systems  Constitutional: Negative for fatigue.  HENT: Negative for mouth sores, mouth dryness and nose dryness.   Eyes: Negative for pain, itching, visual disturbance and dryness.  Respiratory: Negative for cough, hemoptysis, shortness of breath and difficulty breathing.   Cardiovascular: Negative for chest pain, palpitations and swelling in legs/feet.  Gastrointestinal: Negative for abdominal pain, blood in stool, constipation and diarrhea.  Endocrine: Negative for increased urination.  Genitourinary: Negative for painful  urination.  Musculoskeletal: Positive for arthralgias, joint pain, joint swelling, muscle weakness and morning stiffness. Negative for myalgias, muscle tenderness and myalgias.  Skin: Negative for color change, rash and redness.  Allergic/Immunologic: Negative for susceptible to infections.  Neurological: Positive for numbness and headaches. Negative for dizziness, memory loss and weakness.  Hematological: Negative for swollen glands.  Psychiatric/Behavioral: Negative for confusion and sleep disturbance.    PMFS History:  Patient Active Problem List   Diagnosis Date Noted  . Generalized osteoarthritis 01/30/2021  . Acquired trigger finger of right ring finger 03/19/2020  . Acquired trigger finger of right middle finger 03/19/2020  . Pain of left hand 03/19/2020  . 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 Degeneration of lumbar intervertebral disc 11/16/2019  . Spinal stenosis of lumbar region 11/16/2019  . Hypertensive nephropathy 10/12/2018  . Chronic renal disease, stage II 10/12/2018  . Diabetic mononeuropathy associated with type 2 diabetes mellitus ([place]) 10/12/2018  . Type 2 diabetes mellitus with diabetic neuropathy, with long-term current use of insulin ([place]) 08/02/2018  . Age-related cataract of left eye 08/02/2018  . 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) 08/02/2018  . Pain in left knee 03/30/2018  . Pain in right knee 03/30/2018  . Pain in right hand 01/12/2018  . Arthritis 12/21/2017  . Carpal tunnel syndrome 01/01/2016    Past Medical History:  Diagnosis Date  . Arthritis   . Carpal tunnel syndrome 01/01/2016   right  . Diabetes mellitus without complication ([place])   .  High cholesterol   . Hypertension     Family History  Problem Relation Age of Onset  . Diabetes Mother   . Heart Problems Mother   . Hypertension Mother   . Diabetes Father   . Hypertension Father   .  Heart Problems Father   . Hypertension Sister   . Stroke Sister   . Hyperlipidemia Sister   . Cancer Sister        Adenocarcinoma  . Heart Problems Brother   . Healthy Son   . Healthy Daughter   . Healthy Daughter    Past Surgical History:  Procedure Laterality Date  . CHOLECYSTECTOMY    . KNEE ARTHROSCOPY Right 1995  . TONSILLECTOMY    . TUBAL LIGATION  1986   Social History   Social History Narrative  . Not on file   Immunization History  Administered Date(s) Administered  . DTaP 06/07/2018  . Influenza-Unspecified 08/04/2018  . Moderna Sars-Covid-2 Vaccination 03/28/2020, 04/25/2020     Objective: Vital Signs: BP 130/79 (BP Location: Left Arm, Patient Position: Sitting, Cuff Size: Normal)   Pulse 78   Ht 5' 3.5" (1.613 m)   Wt 160 lb 3.2 oz (72.7 kg)   BMI 27.93 kg/m    Physical Exam HENT:     Right Ear: External ear normal.     Left Ear: External ear normal.  Eyes:     Conjunctiva/sclera: Conjunctivae normal.  Cardiovascular:     Rate and Rhythm: Normal rate and regular rhythm.  Pulmonary:     Effort: Pulmonary effort is normal.     Breath sounds: Normal breath sounds.  Skin:    General: Skin is warm and dry.     Findings: No rash.  Neurological:     General: No focal deficit present.     Mental Status: She is alert.  Psychiatric:        Mood and Affect: Mood normal.    Musculoskeletal Exam:  Neck full ROM no tenderness Shoulders full ROM no tenderness or swelling Elbows full ROM no tenderness or swelling Wrists full ROM no tenderness or swelling Fingers full ROM no tenderness or swelling, mild enlargement of few PIP and DIP joints most advanced at right 3rd digit Knees full ROM, no palpable effusions, right knee larger than left, patellofemoral crepitus present bilaterally Ankles full ROM no tenderness or swelling  Investigation: No additional findings.  Imaging: XR Hand 2 View Left  Result Date: 02/01/2021 Xray left hand 2 views  Radiocarpal joint spaces appears normal. Mild 1st CMC degenerative change with normal positioning. MCP joints appear normal. PIP joint spaces appear intact DIPs show sclerosis and lateral osteophyte especially 3rd DIP. Bone mineralization appears normal. Impression Osteoarthritis changes in [facility] and distal finger joints, no erosions or inflammatory changes seen  XR Hand 2 View Right  Result Date: 02/01/2021 Xray right hand 2 views Radiocarpal joint space appears normal, ulnar head slightly irregular. 1st CMC joint moderate degree of degenerative change and some subluxation. MCP joints appear normal. PIP and DIP joint space narrowing with sclerosis and slight osteophyte formation most advanced at 3rd finger. Bone mineralization appears normal. Impression Degenerative arthritis changes in [facility] and distal fingers without erosions or inflammatory changes seen   Recent Labs: Lab Results  Component Value Date   WBC 8.8 01/28/2020   HGB 10.8 (L) 01/28/2020   PLT 269 01/28/2020   NA 142 12/09/2020   K 4.5 12/09/2020   CL 105 12/09/2020   CO2 21 12/09/2020  GLUCOSE 89 12/09/2020   BUN 20 12/09/2020   CREATININE 0.95 12/09/2020   BILITOT <0.2 08/26/2020   ALKPHOS 54 08/26/2020   AST 21 08/26/2020   ALT 17 08/26/2020   PROT 7.5 08/26/2020   ALBUMIN 4.8 08/26/2020   CALCIUM 10.0 12/09/2020   GFRAA 51 (L) 08/26/2020    Speciality Comments: No specialty comments available.  Procedures:  No procedures performed Allergies: Latex   Assessment / Plan:     Visit Diagnoses: Generalized osteoarthritis - Plan: XR Hand 2 View Right, XR Hand 2 View Left  Symptoms appear consistent with generalized osteoarthritis changes particularly noticeable in the bilateral hands and knees at this visit. Xrays of bilateral hands were checked and showing osteoarthritic changes. Discussed management options including therapy, compressive gloves or braces, antiinflammatory medication, analgesics, injections, and  surgical options. No history of underlying secondary causes indicated by history and labs. Her right knee OA is pretty advanced with diminished ROM and may be a candidate for replacement in future so No additional inflammatory arthritis workup or treatment needed at this time and can f/u with orthopedic surgery.  Orders: Orders Placed This Encounter  Procedures  . XR Hand 2 View Right  . XR Hand 2 View Left   No orders of the defined types were placed in this encounter.    Follow-Up Instructions: No follow-ups on file.   Collier Salina, MD  Note - This record has been created using [employer].  Chart creation errors have been sought, but may not always  have been located. Such creation errors do not reflect on  the standard of medical care.

## 2021-02-03 ENCOUNTER — Encounter: Payer: 59 | Admitting: Internal Medicine

## 2021-04-17 ENCOUNTER — Other Ambulatory Visit: Payer: Self-pay | Admitting: Internal Medicine

## 2021-04-20 ENCOUNTER — Other Ambulatory Visit: Payer: Self-pay

## 2021-04-20 ENCOUNTER — Ambulatory Visit (INDEPENDENT_AMBULATORY_CARE_PROVIDER_SITE_OTHER): Payer: 59 | Admitting: Internal Medicine

## 2021-04-20 ENCOUNTER — Encounter: Payer: Self-pay | Admitting: Internal Medicine

## 2021-04-20 VITALS — BP 114/80 | HR 82 | Temp 98.5°F | Ht 63.0 in | Wt 154.8 lb

## 2021-04-20 DIAGNOSIS — Z794 Long term (current) use of insulin: Secondary | ICD-10-CM

## 2021-04-20 DIAGNOSIS — M159 Polyosteoarthritis, unspecified: Secondary | ICD-10-CM

## 2021-04-20 DIAGNOSIS — I129 Hypertensive chronic kidney disease with stage 1 through stage 4 chronic kidney disease, or unspecified chronic kidney disease: Secondary | ICD-10-CM | POA: Diagnosis not present

## 2021-04-20 DIAGNOSIS — E1141 Type 2 diabetes mellitus with diabetic mononeuropathy: Secondary | ICD-10-CM

## 2021-04-20 DIAGNOSIS — E1122 Type 2 diabetes mellitus with diabetic chronic kidney disease: Secondary | ICD-10-CM

## 2021-04-20 DIAGNOSIS — Z0000 Encounter for general adult medical examination without abnormal findings: Secondary | ICD-10-CM | POA: Diagnosis not present

## 2021-04-20 DIAGNOSIS — N1831 Chronic kidney disease, stage 3a: Secondary | ICD-10-CM | POA: Diagnosis not present

## 2021-04-20 LAB — POCT URINALYSIS DIPSTICK
Bilirubin, UA: NEGATIVE
Blood, UA: NEGATIVE
Glucose, UA: NEGATIVE
Ketones, UA: NEGATIVE
Leukocytes, UA: NEGATIVE
Nitrite, UA: NEGATIVE
Protein, UA: NEGATIVE
Spec Grav, UA: 1.01 (ref 1.010–1.025)
Urobilinogen, UA: 0.2 E.U./dL
pH, UA: 5 (ref 5.0–8.0)

## 2021-04-20 LAB — POCT UA - MICROALBUMIN
Albumin/Creatinine Ratio, Urine, POC: <30
Creatinine, POC: 300 mg/dL
Microalbumin Ur, POC: 30 mg/L

## 2021-04-20 MED ORDER — ONETOUCH DELICA LANCETS 33G MISC: 3 refills | Status: DC

## 2021-04-20 MED ORDER — TRAMADOL HCL 50 MG PO TABS: 50.0000 mg | ORAL_TABLET | Freq: Four times a day (QID) | ORAL | 0 refills | Status: AC | PRN

## 2021-04-20 MED ORDER — JANUMET XR 100-1000 MG PO TB24: 1.0000 | ORAL_TABLET | Freq: Every evening | ORAL | 5 refills | Status: DC

## 2021-04-20 NOTE — Progress Notes (Signed)
I,Heather Tanner,acting as a Education administrator for Heather Greenland, MD.,have documented all relevant documentation on the behalf of Heather Greenland, MD,as directed by  Heather Greenland, MD while in the presence of Heather Greenland, MD.  This visit occurred during the SARS-CoV-2 public health emergency.  Safety protocols were in place, including screening questions prior to the visit, additional usage of staff PPE, and extensive cleaning of exam room while observing appropriate contact time as indicated for disinfecting solutions.  Subjective:     Patient ID: Heather Tanner , female    DOB: 1956-02-09 , 65 y.o.   MRN: [id number]   Chief Complaint  Patient presents with  . Annual Exam  . Diabetes  . Hypertension    HPI  She is here today for a full physical examination. She is no longer followed by GYN. Her last pap smear was in 2020. She reports compliance with meds. She denies headaches, chest pain and shortness of breath.   Diabetes She presents for her follow-up diabetic visit. She has type 2 diabetes mellitus. There are no hypoglycemic associated symptoms. Pertinent negatives for diabetes include no blurred vision and no chest pain. There are no hypoglycemic complications. Diabetic complications include peripheral neuropathy and retinopathy. Risk factors for coronary artery disease include diabetes mellitus, dyslipidemia, hypertension, post-menopausal and sedentary lifestyle. She participates in exercise intermittently. An ACE inhibitor/angiotensin II receptor blocker is being taken.  Hypertension This is a chronic problem. The current episode started more than 1 year ago. The problem has been gradually improving since onset. The problem is controlled. Pertinent negatives include no blurred vision or chest pain. Past treatments include ACE inhibitors. Compliance problems include exercise.  Hypertensive end-organ damage includes retinopathy.    Past Medical History:  Diagnosis Date  .  Arthritis   . Carpal tunnel syndrome 01/01/2016   right  . Diabetes mellitus without complication ([place])   . High cholesterol   . Hypertension      Family History  Problem Relation Age of Onset  . Diabetes Mother   . Heart Problems Mother   . Hypertension Mother   . Diabetes Father   . Hypertension Father   . Heart Problems Father   . Hypertension Sister   . Stroke Sister   . Hyperlipidemia Sister   . Cancer Sister        Adenocarcinoma  . Heart Problems Brother   . Healthy Son   . Healthy Daughter   . Healthy Daughter      Current Outpatient Medications:  .  Albuterol Sulfate (PROAIR RESPICLICK) 947 (90 Base) MCG/ACT AEPB, Inhale into the lungs. prn, Disp: , Rfl:  .  BABY ASPIRIN PO, Take by mouth daily., Disp: , Rfl:  .  benzonatate (TESSALON) 100 MG capsule, benzonatate 100 mg capsule, Disp: , Rfl:  .  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  CALCIUM-MAGNESIUM-ZINC PO, Take by mouth., Disp: , Rfl:  .  Calcium-Vitamin D-Vitamin K (VIACTIV PO), Take by mouth. 2 per day, Disp: , Rfl:  .  gabapentin (NEURONTIN) 300 MG capsule, Take 300 mg by mouth in the morning and at bedtime., Disp: , Rfl:  .  HYDROcodone-homatropine (HYDROMET) 5-1.5 MG/5ML syrup, as needed., Disp: , Rfl:  .  insulin glargine, 1 Unit Dial, (TOUJEO SOLOSTAR) 300 UNIT/ML Solostar Pen, Inject 16 Units into the skin at bedtime., Disp: 4.5 mL, Rfl:  3 .  Insulin Pen Needle (PEN NEEDLES) 31G X 5 MM MISC, Inject 1 each into the skin daily., Disp: 100 each, Rfl: 2 .  lisinopril (ZESTRIL) 10 MG tablet, Take 1 tablet (10 mg total) by mouth daily., Disp: 90 tablet, Rfl: 2 .  Misc Natural Products (OSTEO BI-FLEX/5-LOXIN ADVANCED PO), Osteo Bi-Flex, Disp: , Rfl:  .  Multiple Vitamin (MULTIVITAMIN) tablet, Take 1 tablet by mouth daily., Disp: , Rfl:  .  NONFORMULARY OR COMPOUNDED ITEM, Relief factor, Disp: ,  Rfl:  .  OVER THE COUNTER MEDICATION, Total beet chew, Disp: , Rfl:  .  pravastatin (PRAVACHOL) 40 MG tablet, TAKE 1 TABLET BY MOUTH ON MONDAY, WEDNESDAY AND FRIDAY, Disp: 45 tablet, Rfl: 2 .  pregabalin (LYRICA) 100 MG capsule, Take 1 capsule (100 mg total) by mouth 3 (three) times daily., Disp: 270 capsule, Rfl: 1 .  ranitidine (ZANTAC) 150 MG tablet, Take 150 mg by mouth as needed. As needed, Disp: , Rfl:  .  temazepam (RESTORIL) 15 MG capsule, temazepam 15 mg capsule, Disp: , Rfl:  .  UNABLE TO FIND, Med Name: InstaFlex Joint Support, Disp: , Rfl:  .  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  ONETOUCH ULTRA test strip, USE AS INSTRUCTED TO CHECK BLOOD SUGAR THREE TIMES A DAY, Disp: 100 strip, Rfl: 1 .  SitaGLIPtin-MetFORMIN HCl (JANUMET XR) [phone] MG TB24, Take 1 tablet by mouth every evening., Disp: 30 tablet, Rfl: 5 .  traMADol (ULTRAM) 50 MG tablet, Take 1 tablet (50 mg total) by mouth every 6 (six) hours as needed., Disp: 20 tablet, Rfl: 0   Allergies  Allergen Reactions  . Latex       The patient states she uses none for birth control. Last LMP was No LMP recorded. Patient is postmenopausal.. Negative for Dysmenorrhea. Negative for: breast discharge, breast lump(s), breast pain and breast self exam. Associated symptoms include abnormal vaginal bleeding. Pertinent negatives include abnormal bleeding (hematology), anxiety, decreased libido, depression, difficulty falling sleep, dyspareunia, history of infertility, nocturia, sexual dysfunction, sleep disturbances, urinary incontinence, urinary urgency, vaginal discharge and vaginal itching. Diet regular.The patient states her exercise level is  intermittent.  . The patient's tobacco use is:  Social History   Tobacco Use  Smoking Status Never  Smokeless Tobacco Never  . She has been exposed to passive smoke. The patient's alcohol use is:  Social  History   Substance and Sexual Activity  Alcohol Use Yes   Comment: 3 drinks monthly    Review of Systems  Constitutional: Negative.   HENT: Negative.    Eyes: Negative.  Negative for blurred vision.  Respiratory: Negative.    Cardiovascular: Negative.  Negative for chest pain.  Gastrointestinal: Negative.   Endocrine: Negative.   Genitourinary: Negative.   Musculoskeletal:  Positive for arthralgias.  Skin: Negative.   Allergic/Immunologic: Negative.   Neurological:  Positive for numbness.  Hematological: Negative.   Psychiatric/Behavioral: Negative.      Today's Vitals   04/20/21 1049  BP: 114/80  Pulse: 82  Temp: 98.5 F (36.9 C)  TempSrc: Oral  Weight: 154 lb 12.8 oz (70.2 kg)  Height: 5' 3"  (1.6 m)   Body mass index is 27.42 kg/m.  Wt Readings from Last 3 Encounters:  04/20/21 154 lb 12.8 oz (70.2 kg)  01/30/21 160 lb 3.2 oz (72.7 kg)  12/09/20 156 lb 9.6 oz (71 kg)  BP Readings from Last 3 Encounters:  04/20/21 114/80  01/30/21 130/79  12/09/20 132/78    Objective:  Physical Exam Vitals and nursing note reviewed.  Constitutional:      Appearance: Normal appearance. She is not ill-appearing.  HENT:     Head: Normocephalic and atraumatic.     Right Ear: Tympanic membrane, ear canal and external ear normal.     Left Ear: Tympanic membrane, ear canal and external ear normal.     Nose:     Comments: Masked     Mouth/Throat:     Comments: Masked  Eyes:     Extraocular Movements: Extraocular movements intact.     Conjunctiva/sclera: Conjunctivae normal.     Pupils: Pupils are equal, round, and reactive to light.  Cardiovascular:     Rate and Rhythm: Normal rate and regular rhythm.     Pulses: Normal pulses.          Dorsalis pedis pulses are 2+ on the right side and 2+ on the left side.     Heart sounds: Normal heart sounds.  Pulmonary:     Effort: Pulmonary effort is normal.     Breath sounds: Normal breath sounds.  Chest:  Breasts:    Tanner  Score is 5.     Right: Normal.     Left: Normal.  Abdominal:     General: Abdomen is flat. Bowel sounds are normal.     Palpations: Abdomen is soft.  Genitourinary:    Comments: deferred Musculoskeletal:        General: Normal range of motion.     Cervical back: Normal range of motion and neck supple.  Feet:     Right foot:     Protective Sensation: 5 sites tested.  5 sites sensed.     Skin integrity: Skin integrity normal.     Left foot:     Protective Sensation: 5 sites tested.  5 sites sensed.     Skin integrity: Skin integrity normal.  Skin:    General: Skin is warm and dry.  Neurological:     General: No focal deficit present.     Mental Status: She is alert and oriented to person, place, and time.  Psychiatric:        Mood and Affect: Mood normal.        Behavior: Behavior normal.        Assessment And Plan:     1. Routine general medical examination at health care facility Comments: A full exam was performed. Importance of monthly self breast exams was discussed with the patient. PATIENT IS ADVISED TO GET 30-45 MINUTES REGULAR EXERCISE NO LESS THAN FOUR TO FIVE DAYS PER WEEK - BOTH WEIGHTBEARING EXERCISES AND AEROBIC ARE RECOMMENDED.  PATIENT IS ADVISED TO FOLLOW A HEALTHY DIET WITH AT LEAST SIX FRUITS/VEGGIES PER DAY, DECREASE INTAKE OF RED MEAT, AND TO INCREASE FISH INTAKE TO TWO DAYS PER WEEK.  MEATS/FISH SHOULD NOT BE FRIED, BAKED OR BROILED IS PREFERABLE.  IT IS ALSO IMPORTANT TO CUT BACK ON YOUR SUGAR INTAKE. PLEASE AVOID ANYTHING WITH ADDED SUGAR, CORN SYRUP OR OTHER SWEETENERS. IF YOU MUST USE A SWEETENER, YOU CAN TRY STEVIA. IT IS ALSO IMPORTANT TO AVOID ARTIFICIALLY SWEETENERS AND DIET BEVERAGES. LASTLY, I SUGGEST WEARING SPF 50 SUNSCREEN ON EXPOSED PARTS AND ESPECIALLY WHEN IN THE DIRECT SUNLIGHT FOR AN EXTENDED PERIOD OF TIME.  PLEASE AVOID FAST FOOD RESTAURANTS AND INCREASE YOUR WATER INTAKE.  - CBC - CMP14+EGFR - Lipid panel -  Hemoglobin A1c  2. Type 2  diabetes mellitus with stage 3a chronic kidney disease, with long-term current use of insulin ([facility]) Comments: Diabetic foot exam was performed. I DISCUSSED WITH THE PATIENT AT LENGTH REGARDING THE GOALS OF GLYCEMIC CONTROL AND POSSIBLE LONG-TERM COMPLICATIONS.  I  ALSO STRESSED THE IMPORTANCE OF COMPLIANCE WITH HOME GLUCOSE MONITORING, DIETARY RESTRICTIONS INCLUDING AVOIDANCE OF SUGARY DRINKS/PROCESSED FOODS,  ALONG WITH REGULAR EXERCISE.  I  ALSO STRESSED THE IMPORTANCE OF ANNUAL EYE EXAMS, SELF FOOT CARE AND COMPLIANCE WITH OFFICE VISITS.  - POCT Urinalysis Dipstick (81002) - POCT UA - Microalbumin  3. Hypertensive nephropathy Comments: Chronic, well controlled. EKG performed, NSR w/o acute changes. She is encouraged to follow low sodium diet.  - EKG 12-Lead  4. Diabetic mononeuropathy associated with type 2 diabetes mellitus ([place]) Comments: She c/o worsening symptoms. She agrees to Neuro referral for further evaluation of neuropathy. I will also refer her to Podiatry for diabetic foot care.  - Ambulatory referral to Neurology - Ambulatory referral to Podiatry  5. Generalized osteoarthritis Chronic, she has had Rheum evaluation. Pt advised by Rheum to f/u w/ Ortho for her sx. She was given rx tramadol to use prn. Review of the [place] CSRS was performed in accordance of the [place] prior to dispensing any controlled drugs.  Patient was given opportunity to ask questions. Patient verbalized understanding of the plan and was able to repeat key elements of the plan. All questions were answered to their satisfaction.   I, Heather Greenland, MD, have reviewed all documentation for this visit. The documentation on 04/20/21 for the exam, diagnosis, procedures, and orders are all accurate and complete.  THE PATIENT IS ENCOURAGED TO PRACTICE SOCIAL DISTANCING DUE TO THE COVID-19 PANDEMIC.

## 2021-04-21 ENCOUNTER — Other Ambulatory Visit: Payer: Self-pay

## 2021-04-21 LAB — CMP14+EGFR
ALT: 20 IU/L (ref 0–32)
AST: 28 IU/L (ref 0–40)
Albumin/Globulin Ratio: 1.8 (ref 1.2–2.2)
Albumin: 5 g/dL — ABNORMAL HIGH (ref 3.8–4.8)
Alkaline Phosphatase: 60 IU/L (ref 44–121)
BUN/Creatinine Ratio: 18 (ref 12–28)
BUN: 22 mg/dL (ref 8–27)
Bilirubin Total: <0.2 mg/dL (ref 0.0–1.2)
CO2: 23 mmol/L (ref 20–29)
Calcium: 10.3 mg/dL (ref 8.7–10.3)
Chloride: 107 mmol/L — ABNORMAL HIGH (ref 96–106)
Creatinine, Ser: 1.22 mg/dL — ABNORMAL HIGH (ref 0.57–1.00)
Globulin, Total: 2.8 g/dL (ref 1.5–4.5)
Glucose: 90 mg/dL (ref 65–99)
Potassium: 4.7 mmol/L (ref 3.5–5.2)
Sodium: 145 mmol/L — ABNORMAL HIGH (ref 134–144)
Total Protein: 7.8 g/dL (ref 6.0–8.5)
eGFR: 49 mL/min/{1.73_m2} — ABNORMAL LOW (ref >59)

## 2021-04-21 LAB — CBC
Hematocrit: 35 % (ref 34.0–46.6)
Hemoglobin: 11.2 g/dL (ref 11.1–15.9)
MCH: 27.3 pg (ref 26.6–33.0)
MCHC: 32 g/dL (ref 31.5–35.7)
MCV: 85 fL (ref 79–97)
Platelets: 244 10*3/uL (ref 150–450)
RBC: 4.11 x10E6/uL (ref 3.77–5.28)
RDW: 14.1 % (ref 11.7–15.4)
WBC: 7.7 10*3/uL (ref 3.4–10.8)

## 2021-04-21 LAB — LIPID PANEL
Chol/HDL Ratio: 2.8 ratio (ref 0.0–4.4)
Cholesterol, Total: 175 mg/dL (ref 100–199)
HDL: 62 mg/dL (ref >39)
LDL Chol Calc (NIH): 96 mg/dL (ref 0–99)
Triglycerides: 91 mg/dL (ref 0–149)
VLDL Cholesterol Cal: 17 mg/dL (ref 5–40)

## 2021-04-21 LAB — HEMOGLOBIN A1C
Est. average glucose Bld gHb Est-mCnc: 148 mg/dL
Hgb A1c MFr Bld: 6.8 % — ABNORMAL HIGH (ref 4.8–5.6)

## 2021-04-30 ENCOUNTER — Other Ambulatory Visit: Payer: Self-pay

## 2021-04-30 ENCOUNTER — Ambulatory Visit: Payer: 59 | Admitting: Sports Medicine

## 2021-04-30 ENCOUNTER — Encounter: Payer: Self-pay | Admitting: Sports Medicine

## 2021-04-30 ENCOUNTER — Telehealth: Payer: Self-pay

## 2021-04-30 DIAGNOSIS — E1140 Type 2 diabetes mellitus with diabetic neuropathy, unspecified: Secondary | ICD-10-CM | POA: Diagnosis not present

## 2021-04-30 DIAGNOSIS — B351 Tinea unguium: Secondary | ICD-10-CM | POA: Diagnosis not present

## 2021-04-30 DIAGNOSIS — M79674 Pain in right toe(s): Secondary | ICD-10-CM

## 2021-04-30 DIAGNOSIS — M5116 Intervertebral disc disorders with radiculopathy, lumbar region: Secondary | ICD-10-CM

## 2021-04-30 DIAGNOSIS — Z794 Long term (current) use of insulin: Secondary | ICD-10-CM

## 2021-04-30 DIAGNOSIS — M79675 Pain in left toe(s): Secondary | ICD-10-CM | POA: Diagnosis not present

## 2021-04-30 NOTE — Telephone Encounter (Signed)
The pt was notified that her physical form that she is trying to send over is coming over as a pdf form that the office isn't able to access.  The pt was asked if she could fax the form over.  The pt was provided with a fax number and said she will fax tomorrow.

## 2021-04-30 NOTE — Progress Notes (Signed)
Subjective: Heather Tanner is a 65 y.o. female patient with history of diabetes who presents to office today complaining of 1.  Numbness, tingling, sharp shooting burning pains to both feet especially at all toes 2.  Long,mildly painful nails while ambulating in shoes; unable to trim. Patient states that last A1c was good last visit to PCP a week and a half ago.  Patient reports that she is currently on pregabalin for her neuropathy and that her PCP has adjusted the dose but no longer effective and seems like it has not been helping for the last year to year and a half.  Patient also admits to a significant history of spinal problems and knee problems.  Patient reports that she had a back injection recently as well.  No other pedal complaints noted.   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 12/22/2019   Degeneration of lumbar intervertebral disc 11/16/2019   Spinal stenosis of lumbar region 11/16/2019   Hypertensive nephropathy 10/12/2018   Chronic renal disease, stage II 10/12/2018   Diabetic mononeuropathy associated with type 2 diabetes mellitus ([place]) 10/12/2018   Type 2 diabetes mellitus with diabetic neuropathy, with long-term current use of insulin ([place]) 08/02/2018   Age-related cataract of left eye 08/02/2018   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 prn     BABY ASPIRIN PO Take by mouth daily.     benzonatate (TESSALON) 100 MG capsule benzonatate 100 mg capsule     Blood Glucose Monitoring Suppl (ONE TOUCH ULTRA MINI) w/Device KIT Use as instructed to check blood sugars 3 times per day dx: e11.22 1 kit 1   Calcium-Vitamin D-Vitamin K (VIACTIV PO) Take by mouth. 2 per day     gabapentin (NEURONTIN) 300 MG capsule Take 300 mg by mouth in the morning and at bedtime.     insulin glargine, 1 Unit Dial, (TOUJEO SOLOSTAR) 300 UNIT/ML Solostar Pen Inject 16 Units into the skin at bedtime. 4.5 mL 3   Insulin Pen Needle (PEN NEEDLES) 31G X 5 MM MISC Inject 1 each into the skin daily. 100 each 2   lisinopril (ZESTRIL) 10 MG tablet Take 1 tablet (10 mg total) by mouth daily. 90 tablet 2   Misc Natural Products (OSTEO BI-FLEX/5-LOXIN ADVANCED PO) Osteo Bi-Flex     Multiple Vitamin (MULTIVITAMIN) tablet Take 1 tablet by mouth daily.     OneTouch Delica Lancets 10C MISC Use as directed to check blood sugars 3 times per day dx: e11.22 100 each 3   ONETOUCH ULTRA test strip USE AS INSTRUCTED TO CHECK BLOOD SUGAR THREE TIMES A DAY 100 strip 1   OVER THE COUNTER MEDICATION Total beet chew     pravastatin (PRAVACHOL) 40 MG tablet TAKE 1 TABLET BY MOUTH ON MONDAY, WEDNESDAY AND FRIDAY 45 tablet 2   pregabalin (LYRICA) 100 MG capsule  Take 1 capsule (100 mg total) by mouth 3 (three) times daily. 270 capsule 1   ranitidine (ZANTAC) 150 MG tablet Take 150 mg by mouth as needed. As needed     SitaGLIPtin-MetFORMIN HCl (JANUMET XR) [phone] MG TB24 Take 1 tablet by mouth every evening. 30 tablet 5   temazepam (RESTORIL) 15 MG capsule temazepam 15 mg capsule     traMADol (ULTRAM) 50 MG tablet Take 1 tablet (50 mg total) by mouth every 6 (six) hours as needed. 20 tablet 0   UNABLE TO FIND Med Name: InstaFlex Joint Support     HYDROcodone-homatropine (HYDROMET) 5-1.5 MG/5ML syrup as needed. (Patient not taking: Reported on 04/30/2021)     No  current facility-administered medications on file prior to visit.   Allergies  Allergen Reactions   Latex     Recent Results (from the past 2160 hour(s))  CBC     Status: None   Collection Time: 04/20/21 11:50 AM  Result Value Ref Range   WBC 7.7 3.4 - 10.8 x10E3/uL   RBC 4.11 3.77 - 5.28 x10E6/uL   Hemoglobin 11.2 11.1 - 15.9 g/dL   Hematocrit 35.0 34.0 - 46.6 %   MCV 85 79 - 97 fL   MCH 27.3 26.6 - 33.0 pg   MCHC 32.0 31.5 - 35.7 g/dL   RDW 14.1 11.7 - 15.4 %   Platelets 244 150 - 450 x10E3/uL  CMP14+EGFR     Status: Abnormal   Collection Time: 04/20/21 11:50 AM  Result Value Ref Range   Glucose 90 65 - 99 mg/dL   BUN 22 8 - 27 mg/dL   Creatinine, Ser 1.22 (H) 0.57 - 1.00 mg/dL   eGFR 49 (L) >59 mL/min/1.73   BUN/Creatinine Ratio 18 12 - 28   Sodium 145 (H) 134 - 144 mmol/L   Potassium 4.7 3.5 - 5.2 mmol/L   Chloride 107 (H) 96 - 106 mmol/L   CO2 23 20 - 29 mmol/L   Calcium 10.3 8.7 - 10.3 mg/dL   Total Protein 7.8 6.0 - 8.5 g/dL   Albumin 5.0 (H) 3.8 - 4.8 g/dL   Globulin, Total 2.8 1.5 - 4.5 g/dL   Albumin/Globulin Ratio 1.8 1.2 - 2.2   Bilirubin Total <0.2 0.0 - 1.2 mg/dL   Alkaline Phosphatase 60 44 - 121 IU/L   AST 28 0 - 40 IU/L   ALT 20 0 - 32 IU/L  Lipid panel     Status: None   Collection Time: 04/20/21 11:50 AM  Result Value Ref Range   Cholesterol, Total 175 100 - 199 mg/dL   Triglycerides 91 0 - 149 mg/dL   HDL 62 >39 mg/dL   VLDL Cholesterol Cal 17 5 - 40 mg/dL   LDL Chol Calc (NIH) 96 0 - 99 mg/dL   Chol/HDL Ratio 2.8 0.0 - 4.4 ratio    Comment:                                   T. Chol/HDL Ratio                                             Men  Women  1/2 Avg.Risk  3.4    3.3                                   Avg.Risk  5.0    4.4                                2X Avg.Risk  9.6    7.1                                3X Avg.Risk 23.4   11.0   Hemoglobin A1c     Status: Abnormal   Collection Time: 04/20/21 11:50 AM   Result Value Ref Range   Hgb A1c MFr Bld 6.8 (H) 4.8 - 5.6 %    Comment:          Prediabetes: 5.7 - 6.4          Diabetes: >6.4          Glycemic control for adults with diabetes: <7.0    Est. average glucose Bld gHb Est-mCnc 148 mg/dL  POCT Urinalysis Dipstick (15726)     Status: Normal   Collection Time: 04/20/21 12:50 PM  Result Value Ref Range   Color, UA yellow    Clarity, UA clear    Glucose, UA Negative Negative   Bilirubin, UA Negative    Ketones, UA Negative    Spec Grav, UA 1.010 1.010 - 1.025   Blood, UA Negative    pH, UA 5.0 5.0 - 8.0   Protein, UA Negative Negative   Urobilinogen, UA 0.2 0.2 or 1.0 E.U./dL   Nitrite, UA Negative    Leukocytes, UA Negative Negative   Appearance     Odor    POCT UA - Microalbumin     Status: Normal   Collection Time: 04/20/21  1:25 PM  Result Value Ref Range   Microalbumin Ur, POC 30 mg/L   Creatinine, POC 300 mg/dL   Albumin/Creatinine Ratio, Urine, POC <30     Objective: General: Patient is awake, alert, and oriented x 3 and in no acute distress.  Integument: Skin is warm, dry and supple bilateral. Nails are tender, long, thickened and dystrophic with subungual debris, consistent with onychomycosis, 2-5 bilateral.  Bilateral hallux nails are very minimal previous nail avulsion procedures performed many years ago.  No signs of infection. No open lesions or preulcerative lesions present bilateral. Remaining integument unremarkable.  Vasculature:  Dorsalis Pedis pulse 2/4 bilateral. Posterior Tibial pulse 1/4 bilateral. Capillary fill time <3 sec 1-5 bilateral. Positive hair growth to the level of the digits. Temperature gradient within normal limits. No varicosities present bilateral. No edema present bilateral.   Neurology: The patient has intact sensation measured with a 5.07/10g Semmes Weinstein Monofilament at all pedal sites bilateral . Vibratory sensation diminished bilateral with tuning fork.  Musculoskeletal:  Asymptomatic bunion and early hammertoe with pes planus pedal deformities noted bilateral. Muscular strength 5/5 in all lower extremity muscular groups bilateral without pain on range of motion . No tenderness with calf compression bilateral.  Assessment and Plan: Problem List Items Addressed This Visit       Endocrine   Type 2 diabetes mellitus with diabetic neuropathy, with long-term current use of insulin ([place])     Nervous and Auditory   Sciatica due to displacement  of lumbar intervertebral disc   Other Visit Diagnoses     Pain due to onychomycosis of toenails of both feet    -  Primary       -Examined patient. -Discussed and educated patient on diabetic foot care, especially with  regards to the vascular, neurological and musculoskeletal systems.  -Stressed the importance of good glycemic control and the detriment of not  controlling glucose levels in relation to the foot. -Mechanically debrided all nails 2-5 bilateral using sterile nail nipper and filed with dremel without incident  -Advised patient to keep her appointment with neurologist for further evaluation and neurological work-up advised patient that likely her nerve issues could be related to her back versus small fiber neuropathy and diabetes -Advised patient to consider to try Nervive nerve relief supplement -Answered all patient questions -Patient to return  in 3 months for at risk foot care -Patient advised to call the office if any problems or questions arise in the meantime.  Landis Martins, DPM

## 2021-04-30 NOTE — Patient Instructions (Signed)
Nervive nerve relief supplement for your nerves can be purchased OTC at [facility]  

## 2021-05-29 ENCOUNTER — Other Ambulatory Visit: Payer: Self-pay | Admitting: Internal Medicine

## 2021-06-10 ENCOUNTER — Other Ambulatory Visit: Payer: Self-pay | Admitting: Internal Medicine

## 2021-06-15 ENCOUNTER — Other Ambulatory Visit: Payer: Self-pay | Admitting: Internal Medicine

## 2021-06-18 ENCOUNTER — Institutional Professional Consult (permissible substitution): Payer: 59 | Admitting: Neurology

## 2021-06-23 ENCOUNTER — Other Ambulatory Visit: Payer: Self-pay | Admitting: Internal Medicine

## 2021-06-23 ENCOUNTER — Ambulatory Visit: Payer: 59 | Admitting: Neurology

## 2021-06-23 ENCOUNTER — Encounter: Payer: Self-pay | Admitting: Neurology

## 2021-06-23 VITALS — BP 128/79 | HR 85 | Ht 63.0 in | Wt 157.0 lb

## 2021-06-23 DIAGNOSIS — Z1231 Encounter for screening mammogram for malignant neoplasm of breast: Secondary | ICD-10-CM

## 2021-06-23 DIAGNOSIS — R202 Paresthesia of skin: Secondary | ICD-10-CM

## 2021-06-23 DIAGNOSIS — E538 Deficiency of other specified B group vitamins: Secondary | ICD-10-CM | POA: Diagnosis not present

## 2021-06-23 DIAGNOSIS — M545 Low back pain, unspecified: Secondary | ICD-10-CM | POA: Insufficient documentation

## 2021-06-23 NOTE — Progress Notes (Signed)
Reason for visit: Numbness in the hands and feet  Referring physician: Dr. Colin Mulders Heather Tanner is a 65 y.o. female  History of present illness:  Heather Tanner is a 65 year old right-handed black female with a history of diabetes with a greater than 5-year history of some numbness in the hands and feet.  The patient was seen for EMG and nerve conduction study in 2017, the study at that time was normal.  The patient has had persistence of symptoms in the hands and feet with numbness and tingling sensations that are relatively persistent.  Within the last year she has developed diffuse joint and muscle pain that affects the shoulders and arms, and affects the low back going down the legs on both sides.  She denies any significant neck discomfort.  She has no true weakness of extremities.  She has what sounds like trigger fingers affecting the middle fingers on both hands.  She has had some mild gait instability but no falls.  She denies issues controlling the bowels or the bladder.  She has been placed on Lyrica taking 100 mg 3 times daily which is not fully effective for her discomfort.  She is followed through [facility], she claims that she has had MRI study of the low back and is being followed by Dr. Nelva Bush.  She returns for further evaluation.  She has had blood work that included an ANA, rheumatoid factor, CCP level, all of these tests were unremarkable.  Sedimentation rate was minimally elevated at 42.  Past Medical History:  Diagnosis Date   Arthritis    Carpal tunnel syndrome 01/01/2016   right   Diabetes mellitus without complication (HCC)    High cholesterol    Hypertension     Past Surgical History:  Procedure Laterality Date   CHOLECYSTECTOMY     KNEE ARTHROSCOPY Right 1995   TONSILLECTOMY     TUBAL LIGATION  1986    Family History  Problem Relation Age of Onset   Diabetes Mother    Heart Problems Mother    Hypertension Mother    Diabetes Father    Hypertension  Father    Heart Problems Father    Hypertension Sister    Stroke Sister    Hyperlipidemia Sister    Cancer Sister        Adenocarcinoma   Heart Problems Brother    Healthy Son    Healthy Daughter    Healthy Daughter     Social history:  reports that she has never smoked. She has never used smokeless tobacco. She reports current alcohol use. She reports that she does not use drugs.  Medications:  Prior to Admission medications   Medication Sig Start Date End Date Taking? Authorizing Provider  Albuterol Sulfate (PROAIR RESPICLICK) 768 (90 Base) MCG/ACT AEPB Inhale into the lungs. prn   Yes [provider]  BABY ASPIRIN PO Take by mouth daily.   Yes [provider]  benzonatate (TESSALON) 100 MG capsule benzonatate 100 mg capsule   Yes [provider]  Blood Glucose Monitoring Suppl (ONE TOUCH ULTRA MINI) w/Device KIT Use as instructed to check blood sugars 3 times per day dx: e11.22 11/20/20  Yes Glendale Chard, MD  Calcium-Vitamin D-Vitamin K (VIACTIV PO) Take by mouth. 2 per day   Yes [provider]  gabapentin (NEURONTIN) 300 MG capsule Take 300 mg by mouth in the morning and at bedtime.   Yes [provider]  HYDROcodone-homatropine (HYDROMET) 5-1.5 MG/5ML syrup  as needed.   Yes [provider]  Insulin Pen Needle (PEN NEEDLES) 31G X 5 MM MISC Inject 1 each into the skin daily. 04/03/19  Yes Glendale Chard, MD  lisinopril (ZESTRIL) 10 MG tablet TAKE ONE TABLET BY MOUTH DAILY 06/15/21  Yes Glendale Chard, MD  Misc Natural Products (OSTEO BI-FLEX/5-LOXIN ADVANCED PO) Osteo Bi-Flex   Yes [provider]  Multiple Vitamin (MULTIVITAMIN) tablet Take 1 tablet by mouth daily.   Yes [provider]  OneTouch Delica Lancets 76H MISC Use as directed to check blood sugars 3 times per day dx: e11.22 04/20/21  Yes Glendale Chard, MD  [facility] ULTRA test strip USE AS INSTRUCTED TO CHECK BLOOD SUGAR THREE TIMES A DAY 04/20/21  Yes  Glendale Chard, MD  OVER THE COUNTER MEDICATION Total beet chew   Yes [provider]  pravastatin (PRAVACHOL) 40 MG tablet TAKE ONE TABLET BY MOUTH DAILY ON MONDAY, WEDNESDAY, AND FRIDAY 06/10/21  Yes Glendale Chard, MD  pregabalin (LYRICA) 100 MG capsule Take 1 capsule (100 mg total) by mouth 3 (three) times daily. 11/04/20  Yes Minette Brine, FNP  ranitidine (ZANTAC) 150 MG tablet Take 150 mg by mouth as needed. As needed   Yes [provider]  SitaGLIPtin-MetFORMIN HCl (JANUMET XR) [phone] MG TB24 Take 1 tablet by mouth every evening. 04/20/21  Yes Glendale Chard, MD  temazepam (RESTORIL) 15 MG capsule temazepam 15 mg capsule   Yes [provider]  TOUJEO SOLOSTAR 300 UNIT/ML Solostar Pen INJECT 16 UNITS INTO THE SKIN AT BEDTIME. 06/01/21  Yes Glendale Chard, MD  traMADol (ULTRAM) 50 MG tablet Take 1 tablet (50 mg total) by mouth every 6 (six) hours as needed. 04/20/21 04/20/22 Yes Glendale Chard, MD  UNABLE TO FIND Med Name: InstaFlex Joint Support   Yes [provider]      Allergies  Allergen Reactions   Latex     ROS:  Out of a complete 14 system review of symptoms, the patient complains only of the following symptoms, and all other reviewed systems are negative.  Joint and muscle pain Difficulty sleeping Trigger fingers  Blood pressure 128/79, pulse 85, height _0  (1.6 m), weight 157 lb (71.2 kg).  Physical Exam  General: The patient is alert and cooperative at the time of the examination.  Eyes: Pupils are equal, round, and reactive to light. Discs are flat bilaterally.  Neck: The neck is supple, no carotid bruits are noted.  Respiratory: The respiratory examination is clear.  Cardiovascular: The cardiovascular examination reveals a regular rate and rhythm, no obvious murmurs or rubs are noted.  Neuromuscular: Range of movement the cervical spine is relatively full.  Lacks only about 10 degrees of full flexion of the low back.  Skin:  Extremities are without significant edema.  Neurologic Exam  Mental status: The patient is alert and oriented x 3 at the time of the examination. The patient has apparent normal recent and remote memory, with an apparently normal attention span and concentration ability.  Cranial nerves: Facial symmetry is not present.  There is minimal ptosis of the right eye.  There is good sensation of the face to pinprick and soft touch bilaterally. The strength of the facial muscles and the muscles to head turning and shoulder shrug are normal bilaterally. Speech is well enunciated, no aphasia or dysarthria is noted. Extraocular movements are full. Visual fields are full. The tongue is midline, and the patient has symmetric elevation of the soft palate. No obvious hearing  deficits are noted.  Motor: The motor testing reveals 5 over 5 strength of all 4 extremities. Good symmetric motor tone is noted throughout.  Sensory: Sensory testing is intact to pinprick, soft touch, vibration sensation, and position sense on all 4 extremities.  No stocking pattern pinprick sensory deficit was noted.  No evidence of extinction is noted.  Coordination: Cerebellar testing reveals good finger-nose-finger and heel-to-shin bilaterally.  Gait and station: Gait is normal. Tandem gait is normal. Romberg is negative. No drift is seen.  Reflexes: Deep tendon reflexes are symmetric, but are depressed bilaterally. Toes are downgoing bilaterally.   Assessment/Plan:  1.  Diffuse neuromuscular discomfort, joint pain  2.  Reports of numbness in the hands and feet  The patient does have a history of diabetes, but her clinical examination does not support a significant peripheral neuropathy.  She may have some fibromyalgia type symptoms.  She will be sent for blood work today, we will check nerve conduction studies of 1 arm and 1 leg and EMG of 1 leg.  If the above studies are unremarkable, we may consider MRI evaluation of the  cervical spine.  We may need to get the report of the lumbar spine MRI done through [facility].  She will follow-up here in 4 months, in the future she can be seen through Dr. Krista Blue.  Jill Alexanders MD 06/23/2021 11:43 AM  [facility] [address] [place], [place] [address]  Phone [phone] Fax [phone]

## 2021-06-24 ENCOUNTER — Other Ambulatory Visit: Payer: Self-pay | Admitting: Nurse Practitioner

## 2021-06-25 ENCOUNTER — Telehealth: Payer: Self-pay

## 2021-06-25 NOTE — Telephone Encounter (Signed)
The pt was asked to clarify which med is she taking the gabapentin or the pregabalin, the pt said that she was taken off of the gabapentin a while ago and changed to the pregabalin. Patient's chart has been updated.

## 2021-06-27 LAB — ANGIOTENSIN CONVERTING ENZYME: Angio Convert Enzyme: <5 U/L — ABNORMAL LOW (ref 14–82)

## 2021-06-27 LAB — MULTIPLE MYELOMA PANEL, SERUM
Albumin SerPl Elph-Mcnc: 4.1 g/dL (ref 2.9–4.4)
Albumin/Glob SerPl: 1.2 (ref 0.7–1.7)
Alpha 1: 0.2 g/dL (ref 0.0–0.4)
Alpha2 Glob SerPl Elph-Mcnc: 0.9 g/dL (ref 0.4–1.0)
B-Globulin SerPl Elph-Mcnc: 1.3 g/dL (ref 0.7–1.3)
Gamma Glob SerPl Elph-Mcnc: 1.3 g/dL (ref 0.4–1.8)
Globulin, Total: 3.7 g/dL (ref 2.2–3.9)
IgA/Immunoglobulin A, Serum: 116 mg/dL (ref 87–352)
IgG (Immunoglobin G), Serum: 1387 mg/dL (ref 586–1602)
IgM (Immunoglobulin M), Srm: 42 mg/dL (ref 26–217)
Total Protein: 7.8 g/dL (ref 6.0–8.5)

## 2021-06-27 LAB — COPPER, SERUM: Copper: 123 ug/dL (ref 80–158)

## 2021-06-27 LAB — SEDIMENTATION RATE: Sed Rate: 23 mm/hr (ref 0–40)

## 2021-06-27 LAB — LYME DISEASE SEROLOGY W/REFLEX: Lyme Total Antibody EIA: NEGATIVE

## 2021-06-27 LAB — VITAMIN B12: Vitamin B-12: 983 pg/mL (ref 232–1245)

## 2021-06-27 LAB — RPR, QUANT+TP ABS (REFLEX)
Rapid Plasma Reagin, Quant: 1:1 {titer} — ABNORMAL HIGH
T Pallidum Abs: NONREACTIVE

## 2021-06-27 LAB — RPR: RPR Ser Ql: REACTIVE — AB

## 2021-06-27 LAB — HEPATITIS C ANTIBODY: Hep C Virus Ab: <0.1 s/co ratio (ref 0.0–0.9)

## 2021-07-30 ENCOUNTER — Ambulatory Visit: Payer: 59 | Admitting: Sports Medicine

## 2021-07-31 ENCOUNTER — Ambulatory Visit
Admission: RE | Admit: 2021-07-31 | Discharge: 2021-07-31 | Disposition: A | Discharge status: Home / Self Care | Payer: 59 | Source: Ambulatory Visit | Attending: Internal Medicine | Admitting: Internal Medicine

## 2021-07-31 ENCOUNTER — Other Ambulatory Visit: Payer: Self-pay

## 2021-07-31 DIAGNOSIS — Z1231 Encounter for screening mammogram for malignant neoplasm of breast: Secondary | ICD-10-CM

## 2021-08-24 ENCOUNTER — Encounter: Payer: Self-pay | Admitting: Internal Medicine

## 2021-08-24 ENCOUNTER — Ambulatory Visit: Payer: 59 | Admitting: Internal Medicine

## 2021-08-24 ENCOUNTER — Other Ambulatory Visit: Payer: Self-pay

## 2021-08-24 VITALS — BP 120/70 | HR 85 | Temp 98.3°F | Ht 62.4 in | Wt 159.0 lb

## 2021-08-24 DIAGNOSIS — M79641 Pain in right hand: Secondary | ICD-10-CM | POA: Diagnosis not present

## 2021-08-24 DIAGNOSIS — E1122 Type 2 diabetes mellitus with diabetic chronic kidney disease: Secondary | ICD-10-CM

## 2021-08-24 DIAGNOSIS — N1831 Chronic kidney disease, stage 3a: Secondary | ICD-10-CM

## 2021-08-24 DIAGNOSIS — Z6828 Body mass index (BMI) 28.0-28.9, adult: Secondary | ICD-10-CM

## 2021-08-24 DIAGNOSIS — I129 Hypertensive chronic kidney disease with stage 1 through stage 4 chronic kidney disease, or unspecified chronic kidney disease: Secondary | ICD-10-CM | POA: Diagnosis not present

## 2021-08-24 DIAGNOSIS — Z2821 Immunization not carried out because of patient refusal: Secondary | ICD-10-CM

## 2021-08-24 DIAGNOSIS — Z794 Long term (current) use of insulin: Secondary | ICD-10-CM

## 2021-08-24 DIAGNOSIS — E663 Overweight: Secondary | ICD-10-CM

## 2021-08-24 DIAGNOSIS — M79642 Pain in left hand: Secondary | ICD-10-CM

## 2021-08-24 NOTE — Progress Notes (Signed)
I,Tianna Badgett,acting as a Education administrator for [name], MD.,have documented all relevant documentation on the behalf of [name], MD,as directed by  [name], MD while in the presence of [name], MD.  This visit occurred during the SARS-CoV-2 public health emergency.  Safety protocols were in place, including screening questions prior to the visit, additional usage of staff PPE, and extensive cleaning of exam room while observing appropriate contact time as indicated for disinfecting solutions.  Subjective:     Patient ID: Heather Tanner , female    DOB: [birth year] , 65 y.o.   MRN: [id number]   Chief Complaint  Patient presents with   Diabetes   Hypertension    HPI  She is here today for a DM and HTN follow up. She reports compliance with meds. Denies headaches, chest pain and shortness of breath. States her BS range from 118-159.   Diabetes She presents for her follow-up diabetic visit. She has type 2 diabetes mellitus. There are no hypoglycemic associated symptoms. Pertinent negatives for diabetes include no blurred vision and no chest pain. There are no hypoglycemic complications. Diabetic complications include peripheral neuropathy and retinopathy. Risk factors for coronary artery disease include diabetes mellitus, dyslipidemia, hypertension, post-menopausal and sedentary lifestyle. She participates in exercise intermittently. An ACE inhibitor/angiotensin II receptor blocker is being taken.  Hypertension This is a chronic problem. The current episode started more than 1 year ago. The problem has been gradually improving since onset. The problem is controlled. Pertinent negatives include no blurred vision or chest pain. Past treatments include ACE inhibitors. Compliance problems include exercise.  Hypertensive end-organ damage includes retinopathy.    Past Medical History:  Diagnosis Date   Arthritis    Carpal tunnel syndrome 01/01/2016   right   Diabetes  mellitus without complication (HCC)    High cholesterol    Hypertension      Family History  Problem Relation Age of Onset   Diabetes Mother    Heart Problems Mother    Hypertension Mother    Diabetes Father    Hypertension Father    Heart Problems Father    Hypertension Sister    Stroke Sister    Hyperlipidemia Sister    Cancer Sister        Adenocarcinoma   Heart Problems Brother    Healthy Son    Healthy Daughter    Healthy Daughter      Current Outpatient Medications:    Albuterol Sulfate (PROAIR RESPICLICK) 517 (90 Base) MCG/ACT AEPB, Inhale into the lungs. prn, Disp: , Rfl:    BABY ASPIRIN PO, Take by mouth daily., Disp: , Rfl:    Blood Glucose Monitoring Suppl (ONE TOUCH ULTRA MINI) w/Device KIT, Use as instructed to check blood sugars 3 times per day 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 As needed, Disp: , Rfl:     SitaGLIPtin-MetFORMIN HCl (JANUMET XR) [phone] MG TB24, Take 1 tablet by mouth every evening., Disp: 30 tablet, Rfl: 5   temazepam (RESTORIL) 15 MG capsule, temazepam 15 mg capsule, Disp: , Rfl:    TOUJEO SOLOSTAR 300 UNIT/ML Solostar Pen, INJECT 16 UNITS INTO THE SKIN AT BEDTIME., Disp: 1.5 mL, Rfl: 1   traMADol (ULTRAM) 50 MG tablet, Take 1 tablet (50 mg total) by mouth every 6 (six) hours as needed., Disp: 20 tablet, Rfl: 0   UNABLE TO FIND, Med Name: InstaFlex Joint Support, Disp: , Rfl:    HYDROcodone-acetaminophen (NORCO) 10-325 MG tablet, Take 1 tablet by mouth 3 (three) times daily as needed., Disp: , Rfl:    Allergies  Allergen Reactions   Latex      Review of Systems  Constitutional: Negative.   Eyes:  Negative for blurred vision.  Respiratory: Negative.    Cardiovascular: Negative.  Negative for chest pain.  Gastrointestinal: Negative.   Musculoskeletal:  Positive for arthralgias.       She c/o b/l hand pain. Denies fall/trauma. States she has pain throughout the day - dull, throbbing pain. Hands feel quite stiff. Also with trigger finger. Has been eval by Rheum, neg for RA.   Neurological: Negative.     Today's Vitals   08/24/21 1104  BP: 120/70  Pulse: 85  Temp: 98.3 F (36.8 C)  TempSrc: Oral  Weight: 159 lb (72.1 kg)  Height: 5' 2.4" (1.585 m)   Body mass index is 28.71 kg/m.  Wt Readings from Last 3 Encounters:  08/24/21 159 lb (72.1 kg)  06/23/21 157 lb (71.2 kg)  04/20/21 154 lb 12.8 oz (70.2 kg)    Objective:  Physical Exam Vitals and nursing note reviewed.  Constitutional:      Appearance: Normal appearance.  HENT:     Head: Normocephalic and atraumatic.     Nose:     Comments: Masked     Mouth/Throat:     Comments: Masked  Eyes:     Extraocular Movements: Extraocular movements intact.  Cardiovascular:     Rate and Rhythm: Normal rate and regular rhythm.     Heart sounds: Normal heart sounds.  Pulmonary:     Effort: Pulmonary effort is  normal.     Breath sounds: Normal breath sounds.  Musculoskeletal:     Cervical back: Normal range of motion.  Skin:    General: Skin is warm.  Neurological:     General: No focal deficit present.     Mental Status: She is alert.  Psychiatric:        Mood and Affect: Mood normal.        Behavior: Behavior normal.        Assessment And Plan:     1. Type 2 diabetes mellitus with stage 3a chronic kidney disease, with long-term current use of insulin (HCC) Comments: Chronic, well controlled. I will check  labs as below. She will f/u in 4 months. I wil adjust meds as needed.  - BMP8+eGFR - Hemoglobin A1c  2. Hypertensive nephropathy Comments: Chronic, well controlled. She is encouraged to follow low sodium diet. No med changes.   3. Bilateral hand pain Comments: She has been evaluated by Rheum, no inflammatory arthritis noted. She has been diagnosed with OA. She is encouraged to follow anti-inflammatory diet.   4. Overweight with body mass index (BMI) of 28 to 28.9 in adult Comments: She is encouraged to aim for at least 150 minutes of exercise per week.   5. Pneumococcal vaccination declined  6. Influenza vaccination declined   Patient was given opportunity to ask questions. Patient verbalized understanding of the plan and was able to repeat key elements of the plan. All questions were answered to their satisfaction.   I, [name], MD, have reviewed all documentation for this visit. The documentation on 08/24/21 for the exam, diagnosis, procedures, and orders are all accurate and complete.   IF YOU HAVE BEEN REFERRED TO A SPECIALIST, IT MAY TAKE 1-2 WEEKS TO SCHEDULE/PROCESS THE REFERRAL. IF YOU HAVE NOT HEARD FROM US/SPECIALIST IN TWO WEEKS, PLEASE GIVE [place] A CALL AT [phone].   THE PATIENT IS ENCOURAGED TO PRACTICE SOCIAL DISTANCING DUE TO THE COVID-19 PANDEMIC.

## 2021-08-24 NOTE — Patient Instructions (Signed)

## 2021-08-25 ENCOUNTER — Encounter: Payer: Self-pay | Admitting: Internal Medicine

## 2021-08-25 LAB — BMP8+EGFR
BUN/Creatinine Ratio: 15 (ref 12–28)
BUN: 16 mg/dL (ref 8–27)
CO2: 26 mmol/L (ref 20–29)
Calcium: 9.6 mg/dL (ref 8.7–10.3)
Chloride: 108 mmol/L — ABNORMAL HIGH (ref 96–106)
Creatinine, Ser: 1.06 mg/dL — ABNORMAL HIGH (ref 0.57–1.00)
Glucose: 96 mg/dL (ref 70–99)
Potassium: 4.4 mmol/L (ref 3.5–5.2)
Sodium: 148 mmol/L — ABNORMAL HIGH (ref 134–144)
eGFR: 58 mL/min/{1.73_m2} — ABNORMAL LOW (ref >59)

## 2021-08-25 LAB — HEMOGLOBIN A1C
Est. average glucose Bld gHb Est-mCnc: 151 mg/dL
Hgb A1c MFr Bld: 6.9 % — ABNORMAL HIGH (ref 4.8–5.6)

## 2021-08-26 ENCOUNTER — Other Ambulatory Visit: Payer: Self-pay | Admitting: Internal Medicine

## 2021-09-09 ENCOUNTER — Ambulatory Visit (INDEPENDENT_AMBULATORY_CARE_PROVIDER_SITE_OTHER): Payer: 59 | Admitting: Neurology

## 2021-09-09 ENCOUNTER — Ambulatory Visit: Payer: 59 | Admitting: Neurology

## 2021-09-09 ENCOUNTER — Other Ambulatory Visit: Payer: Self-pay | Admitting: Neurology

## 2021-09-09 DIAGNOSIS — R52 Pain, unspecified: Secondary | ICD-10-CM | POA: Insufficient documentation

## 2021-09-09 DIAGNOSIS — R202 Paresthesia of skin: Secondary | ICD-10-CM

## 2021-09-09 MED ORDER — DULOXETINE HCL 60 MG PO CPEP: 60.0000 mg | ORAL_CAPSULE | Freq: Every day | ORAL | 11 refills | Status: DC

## 2021-09-09 NOTE — Progress Notes (Signed)
ASSESSMENT AND PLAN  Heather Tanner is a 65 y.o. female    Diffuse body achy pain Intermittent upper and lower extremity paresthesia,  Normal EMG nerve conduction study  CPK, C-reactive protein to rule out polymyalgia rheumatica  Cymbalta 60 mg daily   DIAGNOSTIC DATA (LABS, IMAGING, TESTING) - I reviewed patient records, labs, notes, testing and imaging myself where available.   MEDICAL HISTORY:  Heather Tanner is a 65 year old female,, seen in request by her primary care physician Dr. Baird Cancer, [place] Mech, for evaluation of intermittent hands and feet paresthesia, diffuse body achy pain, was seen by Dr. Jannifer Franklin, retired neurologist in September 2022, who ordered EMG nerve conduction study.  This is the first time I see her at the clinic.  I reviewed and summarized the referring note.PMHx: HLD HTN DM  Patient complains of more than 5 years history of intermittent bilateral hands and feet paresthesia, had nerve conduction study in 2017 that was normal, patient reported throughout the day, sometimes she has fingers, toes numbness, lasting 10 to 15 minutes, she has to work her hands to get the sensation comes back  In addition she complains of diffuse body achy pain, but denies functional limitation, works full-time job, denies gait abnormality.  She is also taking Lyrica 100 mg 3 times a day for low back pain, followed by pain management Dr. Nelva Bush.  Extensive laboratory evaluation reviewed, elevated A1c 6.9, creatinine 1.06, negative hepatitis C antibody, Treponema pallidum antibody was negative, Lyme titer, protein electrophoresis, copper, ESR B12, lipid panel, rheumatoid factor, ANA, was within normal limit.  Today's EMG nerve conduction study was normal,   PHYSICAL EXAM:   There were no vitals filed for this visit. Not recorded     There is no height or weight on file to calculate BMI.  PHYSICAL EXAMNIATION:  Gen: NAD, conversant, well nourised, well groomed                      Cardiovascular: Regular rate rhythm, no peripheral edema, warm, nontender. Eyes: Conjunctivae clear without exudates or hemorrhage Neck: Supple, no carotid bruits. Pulmonary: Clear to auscultation bilaterally   NEUROLOGICAL EXAM:  MENTAL STATUS: Speech/cognition: Awake, alert, oriented to history taking and casual conversation.   CRANIAL NERVES: CN II: Visual fields are full to confrontation. Pupils are round equal and briskly reactive to light. CN III, IV, VI: extraocular movement are normal. No ptosis. CN V: Facial sensation is intact to light touch CN VII: Face is symmetric with normal eye closure  CN VIII: Hearing is normal to causal conversation. CN IX, X: Phonation is normal. CN XI: Head turning and shoulder shrug are intact  MOTOR: There is no pronator drift of out-stretched arms. Muscle bulk and tone are normal. Muscle strength is normal.  REFLEXES: Reflexes are 2+ and symmetric at the biceps, triceps, knees, and ankles. Plantar responses are flexor.  SENSORY: Intact to light touch, pinprick and vibratory sensation are intact in fingers and toes.  COORDINATION: There is no trunk or limb dysmetria noted.  GAIT/STANCE: Posture is normal. Gait is steady with normal   REVIEW OF SYSTEMS:  Full 14 system review of systems performed and notable only for as above All other review of systems were negative.   ALLERGIES: Allergies  Allergen Reactions   Latex     HOME MEDICATIONS: Current Outpatient Medications  Medication Sig Dispense Refill   Albuterol Sulfate (PROAIR RESPICLICK) 382 (90 Base) MCG/ACT AEPB Inhale into the lungs. prn  BABY ASPIRIN PO Take by mouth daily.     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 2 per day     HYDROcodone-acetaminophen (NORCO) 10-325 MG tablet Take 1  tablet by mouth 3 (three) times daily as needed.     Insulin Pen Needle (PEN NEEDLES) 31G X 5 MM MISC Inject 1 each into the skin daily. 100 each 2   Lancets (ONETOUCH DELICA PLUS [id number]) MISC USE AS DIRECTED TO CHECK BLOOD SUGAR THREE TIMES A DAY 100 each 3   lisinopril (ZESTRIL) 10 MG tablet TAKE ONE TABLET BY MOUTH DAILY 30 tablet 2   Misc Natural Products (OSTEO BI-FLEX/5-LOXIN ADVANCED PO) Osteo Bi-Flex     Multiple Vitamin (MULTIVITAMIN) tablet Take 1 tablet by mouth daily.     ONETOUCH ULTRA test strip USE AS INSTRUCTED TO CHECK BLOOD SUGAR THREE TIMES A DAY 100 strip 1   OVER THE COUNTER MEDICATION Total beet chew     pravastatin (PRAVACHOL) 40 MG tablet TAKE ONE TABLET BY MOUTH DAILY ON MONDAY, WEDNESDAY, AND FRIDAY 90 tablet 1   pregabalin (LYRICA) 100 MG capsule TAKE 1 CAPSULE(S) BY MOUTH THREE TIMES A DAY 270 capsule 2   ranitidine (ZANTAC) 150 MG tablet Take 150 mg by mouth as needed. As needed     SitaGLIPtin-MetFORMIN HCl (JANUMET XR) [phone] MG TB24 Take 1 tablet by mouth every evening. 30 tablet 5   temazepam (RESTORIL) 15 MG capsule temazepam 15 mg capsule     TOUJEO SOLOSTAR 300 UNIT/ML Solostar Pen INJECT 16 UNITS INTO THE SKIN AT BEDTIME. 1.5 mL 1   traMADol (ULTRAM) 50 MG tablet Take 1 tablet (50 mg total) by mouth every 6 (six) hours as needed. 20 tablet 0   UNABLE TO FIND Med Name: InstaFlex Joint Support     No current facility-administered medications for this visit.    PAST MEDICAL HISTORY: Past Medical History:  Diagnosis Date   Arthritis    Carpal tunnel syndrome 01/01/2016   right   Diabetes mellitus without complication (HCC)    High cholesterol    Hypertension     PAST SURGICAL HISTORY: Past Surgical History:  Procedure Laterality Date   CHOLECYSTECTOMY     KNEE ARTHROSCOPY Right 1995   TONSILLECTOMY     TUBAL LIGATION  1986    FAMILY HISTORY: Family History  Problem Relation Age of Onset   Diabetes Mother    Heart Problems Mother     Hypertension Mother    Diabetes Father    Hypertension Father    Heart Problems Father    Hypertension Sister    Stroke Sister    Hyperlipidemia Sister    Cancer Sister        Adenocarcinoma   Heart Problems Brother    Healthy Son    Healthy Daughter    Healthy Daughter     SOCIAL HISTORY: Social History   Socioeconomic History   Marital status: Divorced    Spouse name: Not on file   Number of children: Not on file   Years of education: Not on file   Highest education level: Not on file  Occupational History   Not on file  Tobacco Use   Smoking status: Never   Smokeless tobacco: Never  Vaping Use   Vaping Use: Never used  Substance and Sexual Activity  Alcohol use: Yes    Comment: 3 drinks monthly   Drug use: Never   Sexual activity: Not on file  Other Topics Concern   Not on file  Social History Narrative   Not on file   Social Determinants of Health   Financial Resource Strain: Not on file  Food Insecurity: Not on file  Transportation Needs: Not on file  Physical Activity: Not on file  Stress: Not on file  Social Connections: Not on file  Intimate Partner Violence: Not on file      Marcial Pacas, M.D. Ph.D.  [facility] [address], [place], [place] [address] Ph: [phone] Fax: [phone]  CC:  Glendale Chard, MD [address] [place],  [place] [address]  Glendale Chard, MD

## 2021-09-09 NOTE — Procedures (Signed)
Full Name: Heather Tanner [facility] Gender: Female MRN #: [id number] Date of Birth: June 11, 1956    Visit Date: 09/09/2021 09:23 Age: 65 Years Examining Physician: Marcial Pacas, MD  Referring Physician: Floyde Parkins, MD Height: 5 feet 2 inch Patient History: 159lbs History: 65 year old female, presented with intermittent extremity paresthesia, diffuse body achy pain  Summary of the test: Nerve conduction study: Right sural, superficial peroneal, median, and ulnar sensory responses were normal.  Right tibial, peroneal to EDB, ulnar and median motor responses were normal  Electromyography: Selected needle examination of right upper, lower extremity muscles, cervical and lumbar paraspinal muscles were normal.  Conclusion: This is a normal study.  There is no electrodiagnostic evidence of right upper, lower extremity neuropathy, large fiber peripheral neuropathy, right cervical/lumbosacral radiculopathy.    ------------------------------- Marcial Pacas M.D. PhD  [facility] [address] [place], [place] [address] Tel: [phone] Fax: [phone]  Verbal informed consent was obtained from the patient, patient was informed of potential risk of procedure, including bruising, bleeding, hematoma formation, infection, muscle weakness, muscle pain, numbness, among others.        [place]    Nerve / Sites Muscle Latency Ref. Amplitude Ref. Rel Amp Segments Distance Velocity Ref. Area    ms ms mV mV %  cm m/s m/s mVms  R Median - APB     Wrist APB 3.7 ?4.4 5.3 ?4.0 100 Wrist - APB 7   16.8     Upper arm APB 8.2  5.7  106 Upper arm - Wrist 22 49 ?49 21.3  R Ulnar - ADM     Wrist ADM 2.5 ?3.3 8.7 ?6.0 100 Wrist - ADM 7   20.4     B.Elbow ADM 5.7  7.3  83.6 B.Elbow - Wrist 20 63 ?49 19.1     A.Elbow ADM 7.5  6.7  92.2 A.Elbow - B.Elbow 10 55 ?49 18.1  R Peroneal - EDB     Ankle EDB 3.2 ?6.5 2.9 ?2.0 100 Ankle - EDB 9   7.4     Fib head EDB 9.3  2.3  80.1 Fib  head - Ankle 28 46 ?44 7.0     Pop fossa EDB 11.6  2.2  94.7 Pop fossa - Fib head 10 44 ?44 6.6         Pop fossa - Ankle      R Tibial - AH     Ankle AH 3.2 ?5.8 7.0 ?4.0 100 Ankle - AH 9   11.9     Pop fossa AH 12.6  3.5  50 Pop fossa - Ankle 40 43 ?41 8.1             SNC    Nerve / Sites Rec. Site Peak Lat Ref.  Amp Ref. Segments Distance Peak Diff Ref.    ms ms V V  cm ms ms  R Sural - Ankle (Calf)     Calf Ankle 3.0 ?4.4 13 ?6 Calf - Ankle 14    R Superficial peroneal - Ankle     Lat leg Ankle 3.3 ?4.4 8 ?6 Lat leg - Ankle 14    R Median - Orthodromic (Dig II, Mid palm)     Dig II Wrist 3.4 ?3.4 10 ?10 Dig II - Wrist 13    R Ulnar - Orthodromic, (Dig V, Mid palm)     Dig V Wrist 2.4 ?3.1 5 ?5 Dig V - Wrist 11  F  Wave    Nerve F Lat Ref.   ms ms  R Tibial - AH 53.6 ?56.0  R Ulnar - ADM 28.6 ?32.0         EMG Summary Table    Spontaneous MUAP Recruitment  Muscle IA Fib PSW Fasc Other Amp Dur. Poly Pattern  R. Tibialis anterior Normal None None None _______ Normal Normal Normal Normal  R. Tibialis posterior Normal None None None _______ Normal Normal Normal Normal  R. Gastrocnemius (Medial head) Normal None None None _______ Normal Normal Normal Normal  R. Vastus lateralis Normal None None None _______ Normal Normal Normal Normal  R. Peroneus longus Normal None None None _______ Normal Normal Normal Normal  R. Lumbar paraspinals (low) Normal None None None _______ Normal Normal Normal Normal  R. Lumbar paraspinals (mid) Normal None None None _______ Normal Normal Normal Normal  R. First dorsal interosseous Normal None None None _______ Normal Normal Normal Normal  R. Brachioradialis Normal None None None _______ Normal Normal Normal Normal  R. Biceps brachii Normal None None None _______ Normal Normal Normal Normal  R. Deltoid Normal None None None _______ Normal Normal Normal Normal  R. Pronator teres Normal None None None _______ Normal Normal Normal  Normal

## 2021-09-11 LAB — ANA W/REFLEX IF POSITIVE: Anti Nuclear Antibody (ANA): NEGATIVE

## 2021-09-11 LAB — C-REACTIVE PROTEIN: CRP: <1 mg/L (ref 0–10)

## 2021-09-11 LAB — CK: Total CK: 173 U/L (ref 32–182)

## 2021-09-13 ENCOUNTER — Other Ambulatory Visit: Payer: Self-pay | Admitting: Internal Medicine

## 2021-10-02 DIAGNOSIS — M1712 Unilateral primary osteoarthritis, left knee: Secondary | ICD-10-CM | POA: Insufficient documentation

## 2021-10-02 DIAGNOSIS — M1711 Unilateral primary osteoarthritis, right knee: Secondary | ICD-10-CM | POA: Insufficient documentation

## 2021-10-04 ENCOUNTER — Other Ambulatory Visit: Payer: Self-pay | Admitting: Internal Medicine

## 2021-10-15 ENCOUNTER — Other Ambulatory Visit: Payer: Self-pay | Admitting: Internal Medicine

## 2021-10-26 ENCOUNTER — Encounter: Payer: Self-pay | Admitting: Neurology

## 2021-10-26 ENCOUNTER — Ambulatory Visit: Payer: 59 | Admitting: Neurology

## 2021-10-26 VITALS — BP 107/63 | Temp 80.0°F | Ht 62.0 in | Wt 159.0 lb

## 2021-10-26 DIAGNOSIS — R202 Paresthesia of skin: Secondary | ICD-10-CM | POA: Diagnosis not present

## 2021-10-26 DIAGNOSIS — R52 Pain, unspecified: Secondary | ICD-10-CM | POA: Diagnosis not present

## 2021-10-26 NOTE — Progress Notes (Signed)
ASSESSMENT AND PLAN  Heather Tanner is a 66 y.o. female    Diffuse body achy pain  Normal EMG nerve conduction study  CPK, C-reactive protein, ESR showed no significant abnormality, no evidence of polymyalgia rheumatica  Symptoms much improved with Cymbalta 60 mg daily  Fingertips and foot paresthesia  Differentiation diagnosis include small fiber neuropathy  She has no pain, if symptoms get worse, may consider skin biopsy if needed   DIAGNOSTIC DATA (LABS, IMAGING, TESTING) - I reviewed patient records, labs, notes, testing and imaging myself where available.   MEDICAL HISTORY:  Heather Tanner is a 66 year old female,, seen in request by her primary care physician Dr. Baird Cancer, Bailey Mech, for evaluation of intermittent hands and feet paresthesia, diffuse body achy pain, was seen by Dr. Jannifer Franklin, retired neurologist in September 2022, who ordered EMG nerve conduction study.  This is the first time I see her at the clinic.  I reviewed and summarized the referring note.PMHx: HLD HTN DM  Patient complains of more than 5 years history of intermittent bilateral hands and feet paresthesia, had nerve conduction study in 2017 that was normal, patient reported throughout the day, sometimes she has fingers, toes numbness, lasting 10 to 15 minutes, she has to work her hands to get the sensation comes back  In addition she complains of diffuse body achy pain, but denies functional limitation, works full-time job, denies gait abnormality.  She is also taking Lyrica 100 mg 3 times a day for low back pain, followed by pain management Dr. Nelva Bush.  Extensive laboratory evaluation reviewed, elevated A1c 6.9, creatinine 1.06, negative hepatitis C antibody, Treponema pallidum antibody was negative, Lyme titer, protein electrophoresis, copper, ESR B12, lipid panel, rheumatoid factor, ANA, was within normal limit.  Today's EMG nerve conduction study was normal,  UPDATE Oct 26 2021: Cymbalta  60 mg daily since last visit in December 2022, she tolerated well, reported significant improvement, no longer has diffuse body achy pain,  She can ambulate without much difficulty, but still has intermittent bilateral fingertips and toes paresthesia, no significant neck pain, occasionally low back pain, no bowel bladder incontinence  Laboratory evaluations in December 2022, normal negative CPK, ANA, ESR, B12, C-reactive protein, A1c was 6.9, negative RPR, hepatitis C, Lyme titer, protein electrophoresis, copper,  PHYSICAL EXAM:   Vitals:   10/26/21 1127  BP: 107/63  Temp: (!) 80 F (26.7 C)  Weight: 159 lb (72.1 kg)  Height: 5' 2"  (1.575 m)   Not recorded     Body mass index is 29.08 kg/m.  PHYSICAL EXAMNIATION:  Gen: NAD, conversant, well nourised, well groomed        MENTAL STATUS: Speech/cognition: Awake, alert, oriented to history taking and casual conversation.   CRANIAL NERVES: CN II: Visual fields are full to confrontation. Pupils are round equal and briskly reactive to light. CN III, IV, VI: extraocular movement are normal. No ptosis. CN V: Facial sensation is intact to light touch CN VII: Face is symmetric with normal eye closure  CN VIII: Hearing is normal to causal conversation. CN IX, X: Phonation is normal. CN XI: Head turning and shoulder shrug are intact  MOTOR: There is no pronator drift of out-stretched arms. Muscle bulk and tone are normal. Muscle strength is normal.  REFLEXES: Reflexes are 2+ and symmetric at the biceps, triceps, knees, and ankles. Plantar responses are flexor.  SENSORY: Intact to light touch, pinprick and vibratory sensation are intact in fingers and toes.  COORDINATION: There is no trunk  or limb dysmetria noted.  GAIT/STANCE: Able to get up arm crossed, posture is normal. Gait is steady with normal   REVIEW OF SYSTEMS:  Full 14 system review of systems performed and notable only for as above All other review of systems  were negative.   ALLERGIES: Allergies  Allergen Reactions   Latex     HOME MEDICATIONS: Current Outpatient Medications  Medication Sig Dispense Refill   Albuterol Sulfate (PROAIR RESPICLICK) 115 (90 Base) MCG/ACT AEPB Inhale into the lungs. prn     BABY ASPIRIN PO Take by mouth daily.     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 2 per day     DULoxetine (CYMBALTA) 60 MG capsule Take 1 capsule (60 mg total) by mouth daily. 30 capsule 11   HYDROcodone-acetaminophen (NORCO) 10-325 MG tablet Take 1 tablet by mouth 3 (three) times daily as needed.     Insulin Pen Needle (PEN NEEDLES) 31G X 5 MM MISC Inject 1 each into the skin daily. 100 each 2   JANUMET XR [id number] MG TB24 TAKE 1 TABLET BY MOUTH EVERY EVENING 30 tablet 5   Lancets (ONETOUCH DELICA PLUS [id number]) MISC USE AS DIRECTED TO CHECK BLOOD SUGAR THREE TIMES A DAY 100 each 3   lisinopril (ZESTRIL) 10 MG tablet TAKE ONE TABLET BY MOUTH DAILY 30 tablet 2   Multiple Vitamin (MULTIVITAMIN) tablet Take 1 tablet by mouth daily.     ONETOUCH ULTRA test strip USE AS INSTRUCTED TO CHECK BLOOD SUGAR THREE TIMES A DAY 100 strip 1   OVER THE COUNTER MEDICATION Total beet chew     pravastatin (PRAVACHOL) 40 MG tablet TAKE ONE TABLET BY MOUTH DAILY ON MONDAY, WEDNESDAY, AND FRIDAY 90 tablet 1   pregabalin (LYRICA) 100 MG capsule TAKE 1 CAPSULE(S) BY MOUTH THREE TIMES A DAY 270 capsule 2   ranitidine (ZANTAC) 150 MG tablet Take 150 mg by mouth as needed. As needed     temazepam (RESTORIL) 15 MG capsule temazepam 15 mg capsule     TOUJEO SOLOSTAR 300 UNIT/ML Solostar Pen INJECT 16 UNITS INTO THE SKIN AT BEDTIME 1.5 mL 1   traMADol (ULTRAM) 50 MG tablet Take 1 tablet (50 mg total) by mouth every 6 (six) hours as needed. 20 tablet 0   No current facility-administered medications for this  visit.    PAST MEDICAL HISTORY: Past Medical History:  Diagnosis Date   Arthritis    Carpal tunnel syndrome 01/01/2016   right   Diabetes mellitus without complication (HCC)    High cholesterol    Hypertension     PAST SURGICAL HISTORY: Past Surgical History:  Procedure Laterality Date   CHOLECYSTECTOMY     KNEE ARTHROSCOPY Right 1995   TONSILLECTOMY     TUBAL LIGATION  1986    FAMILY HISTORY: Family History  Problem Relation Age of Onset   Diabetes Mother    Heart Problems Mother    Hypertension Mother    Diabetes Father    Hypertension Father    Heart Problems Father    Hypertension Sister    Stroke Sister    Hyperlipidemia Sister    Cancer Sister        Adenocarcinoma   Heart Problems Brother    Healthy Son    Healthy Daughter    Healthy Daughter  SOCIAL HISTORY: Social History   Socioeconomic History   Marital status: Divorced    Spouse name: Not on file   Number of children: Not on file   Years of education: Not on file   Highest education level: Not on file  Occupational History   Not on file  Tobacco Use   Smoking status: Never   Smokeless tobacco: Never  Vaping Use   Vaping Use: Never used  Substance and Sexual Activity   Alcohol use: Yes    Comment: 3 drinks monthly   Drug use: Never   Sexual activity: Not on file  Other Topics Concern   Not on file  Social History Narrative   Not on file   Social Determinants of Health   Financial Resource Strain: Not on file  Food Insecurity: Not on file  Transportation Needs: Not on file  Physical Activity: Not on file  Stress: Not on file  Social Connections: Not on file  Intimate Partner Violence: Not on file      Marcial Pacas, M.D. Ph.D.  [facility] [address] [place], [place] [address] Ph: [phone] Fax: [phone]  CC:  Glendale Chard, MD [address] [place],  [place] [address]  Glendale Chard, MD

## 2021-12-02 ENCOUNTER — Other Ambulatory Visit: Payer: Self-pay | Admitting: Internal Medicine

## 2021-12-07 LAB — HM DIABETES EYE EXAM

## 2021-12-09 ENCOUNTER — Other Ambulatory Visit: Payer: Self-pay

## 2021-12-09 MED ORDER — PEN NEEDLES 31G X 5 MM MISC: 1.0000 | Freq: Every day | 2 refills | Status: AC

## 2021-12-12 ENCOUNTER — Other Ambulatory Visit: Payer: Self-pay | Admitting: Internal Medicine

## 2021-12-23 ENCOUNTER — Encounter: Payer: 59 | Admitting: Internal Medicine

## 2021-12-23 NOTE — Patient Instructions (Signed)

## 2021-12-23 NOTE — Progress Notes (Signed)
Rescheduled appt

## 2021-12-24 ENCOUNTER — Other Ambulatory Visit: Payer: Self-pay

## 2021-12-24 ENCOUNTER — Ambulatory Visit: Payer: 59 | Admitting: Internal Medicine

## 2021-12-24 ENCOUNTER — Encounter: Payer: Self-pay | Admitting: Internal Medicine

## 2021-12-24 VITALS — BP 112/70 | HR 64 | Temp 98.5°F | Ht 62.0 in | Wt 161.2 lb

## 2021-12-24 DIAGNOSIS — Z794 Long term (current) use of insulin: Secondary | ICD-10-CM

## 2021-12-24 DIAGNOSIS — E1122 Type 2 diabetes mellitus with diabetic chronic kidney disease: Secondary | ICD-10-CM | POA: Diagnosis not present

## 2021-12-24 DIAGNOSIS — I129 Hypertensive chronic kidney disease with stage 1 through stage 4 chronic kidney disease, or unspecified chronic kidney disease: Secondary | ICD-10-CM | POA: Diagnosis not present

## 2021-12-24 DIAGNOSIS — Z6829 Body mass index (BMI) 29.0-29.9, adult: Secondary | ICD-10-CM

## 2021-12-24 DIAGNOSIS — N1831 Chronic kidney disease, stage 3a: Secondary | ICD-10-CM

## 2021-12-24 DIAGNOSIS — K219 Gastro-esophageal reflux disease without esophagitis: Secondary | ICD-10-CM

## 2021-12-24 DIAGNOSIS — E113552 Type 2 diabetes mellitus with stable proliferative diabetic retinopathy, left eye: Secondary | ICD-10-CM

## 2021-12-24 MED ORDER — FAMOTIDINE 20 MG PO TABS: 20.0000 mg | ORAL_TABLET | Freq: Two times a day (BID) | ORAL | 1 refills | Status: DC

## 2021-12-24 NOTE — Progress Notes (Signed)
?[name],acting as a Education administrator for [name], MD.,have documented all relevant documentation on the behalf of [name], MD,as directed by  [name], MD while in the presence of [name], MD.  ?This visit occurred during the SARS-CoV-2 public health emergency.  Safety protocols were in place, including screening questions prior to the visit, additional usage of staff PPE, and extensive cleaning of exam room while observing appropriate contact time as indicated for disinfecting solutions. ? ?Subjective:  ?  ? Patient ID: Heather Tanner , female    DOB: May 21, 1956 , 66 y.o.   MRN: [id number] ? ? ?Chief Complaint  ?Patient presents with  ? Diabetes  ? Hypertension  ? ? ?HPI ? ?She is here today for a DM and HTN follow up. She reports compliance with meds. Denies headaches, chest pain and shortness of breath.  ? ?Diabetes ?She presents for her follow-up diabetic visit. She has type 2 diabetes mellitus. There are no hypoglycemic associated symptoms. Pertinent negatives for diabetes include no blurred vision, no chest pain, no polydipsia, no polyphagia and no polyuria. There are no hypoglycemic complications. Diabetic complications include peripheral neuropathy and retinopathy. Risk factors for coronary artery disease include diabetes mellitus, dyslipidemia, hypertension, post-menopausal and sedentary lifestyle. She participates in exercise intermittently. An ACE inhibitor/angiotensin II receptor blocker is being taken.  ?Hypertension ?This is a chronic problem. The current episode started more than 1 year ago. The problem has been gradually improving since onset. The problem is controlled. Pertinent negatives include no blurred vision or chest pain. Past treatments include ACE inhibitors. Compliance problems include exercise.  Hypertensive end-organ damage includes retinopathy.   ? ?Past Medical History:  ?Diagnosis Date  ? Arthritis   ? Carpal tunnel syndrome 01/01/2016  ? right   ? Diabetes mellitus without complication ([place])   ? High cholesterol   ? Hypertension   ?  ? ?Family History  ?Problem Relation Age of Onset  ? Diabetes Mother   ? Heart Problems Mother   ? Hypertension Mother   ? Diabetes Father   ? Hypertension Father   ? Heart Problems Father   ? Hypertension Sister   ? Stroke Sister   ? Hyperlipidemia Sister   ? Cancer Sister   ?     Adenocarcinoma  ? Heart Problems Brother   ? Healthy Son   ? Healthy Daughter   ? Healthy Daughter   ? ? ? ?Current Outpatient Medications:  ?  Albuterol Sulfate (PROAIR RESPICLICK) 947 (90 Base) MCG/ACT AEPB, Inhale into the lungs. prn, Disp: , Rfl:  ?  BABY ASPIRIN PO, Take by mouth daily., Disp: , Rfl:  ?  Blood Glucose Monitoring Suppl (ONE TOUCH ULTRA MINI) w/Device KIT, Use as instructed to check blood sugars 3 times per day dx: e11.22, Disp: 1 kit, Rfl: 1 ?  Calcium-Vitamin D-Vitamin K (VIACTIV PO), Take by mouth. 2 per day, Disp: , Rfl:  ?  DULoxetine (CYMBALTA) 60 MG capsule, Take 1 capsule (60 mg total) by mouth daily., Disp: 30 capsule, Rfl: 11 ?  famotidine (PEPCID) 20 MG tablet, Take 1 tablet (20 mg total) by mouth 2 (two) times daily., Disp: 60 tablet, Rfl: 1 ?  HYDROcodone-acetaminophen (NORCO) 10-325 MG tablet, Take 1 tablet by mouth 3 (three) times daily as needed., Disp: , Rfl:  ?  Insulin Pen Needle (PEN NEEDLES) 31G X 5 MM MISC, Inject 1 each into the skin daily., Disp: 100 each, Rfl: 2 ?  JANUMET XR [phone]  MG TB24, TAKE 1 TABLET BY MOUTH EVERY EVENING, Disp: 30 tablet, Rfl: 5 ?  Lancets (ONETOUCH DELICA PLUS [id number]) MISC, USE AS DIRECTED TO CHECK BLOOD SUGAR THREE TIMES A DAY, Disp: 100 each, Rfl: 3 ?  lisinopril (ZESTRIL) 10 MG tablet, TAKE ONE TABLET BY MOUTH DAILY, Disp: 90 tablet, Rfl: 1 ?  Multiple Vitamin (MULTIVITAMIN) tablet, Take 1 tablet by mouth daily., Disp: , Rfl:  ?  ONETOUCH ULTRA test strip, USE AS INSTRUCTED TO CHECK BLOOD SUGAR THREE TIMES A DAY, Disp: 100 strip, Rfl: 1 ?  OVER THE COUNTER MEDICATION,  Total beet chew, Disp: , Rfl:  ?  pravastatin (PRAVACHOL) 40 MG tablet, TAKE ONE TABLET BY MOUTH DAILY ON MONDAY, WEDNESDAY, AND FRIDAY, Disp: 90 tablet, Rfl: 1 ?  pregabalin (LYRICA) 100 MG capsule, TAKE 1 CAPSULE(S) BY MOUTH THREE TIMES A DAY, Disp: 270 capsule, Rfl: 2 ?  temazepam (RESTORIL) 15 MG capsule, temazepam 15 mg capsule, Disp: , Rfl:  ?  TOUJEO SOLOSTAR 300 UNIT/ML Solostar Pen, INJECT 16 UNITS INTO THE SKIN AT BEDTIME, Disp: 1.5 mL, Rfl: 1 ?  traMADol (ULTRAM) 50 MG tablet, Take 1 tablet (50 mg total) by mouth every 6 (six) hours as needed., Disp: 20 tablet, Rfl: 0  ? ?Allergies  ?Allergen Reactions  ? Latex   ?  ? ?Review of Systems  ?Constitutional: Negative.   ?Eyes: Negative.  Negative for blurred vision.  ?Respiratory: Negative.    ?Cardiovascular: Negative.  Negative for chest pain.  ?Gastrointestinal: Negative.   ?     She would like to get refill of ranitidine. She has taken this in the past for reflux. She states she is now having daily sx, she is not sure what is contributing to her sx. Denies n/v. Traveling to [place] next week, does not want to be sick while traveling.  ?Endocrine: Negative for polydipsia, polyphagia and polyuria.  ?Neurological: Negative.   ?Psychiatric/Behavioral: Negative.     ? ?Today's Vitals  ? 12/24/21 1429  ?BP: 112/70  ?Pulse: 64  ?Temp: 98.5 ?F (36.9 ?C)  ?Weight: 161 lb 3.2 oz (73.1 kg)  ?Height: _0  (1.575 m)  ?PainSc: 0-No pain  ? ?Body mass index is 29.48 kg/m?.  ?Wt Readings from Last 3 Encounters:  ?12/24/21 161 lb 3.2 oz (73.1 kg)  ?10/26/21 159 lb (72.1 kg)  ?08/24/21 159 lb (72.1 kg)  ?  ? ?Objective:  ?Physical Exam ?Vitals and nursing note reviewed.  ?Constitutional:   ?   Appearance: Normal appearance.  ?HENT:  ?   Head: Normocephalic and atraumatic.  ?   Nose:  ?   Comments: Masked  ?   Mouth/Throat:  ?   Comments: Masked  ?Eyes:  ?   Extraocular Movements: Extraocular movements intact.  ?Cardiovascular:  ?   Rate and Rhythm: Normal rate and regular  rhythm.  ?   Heart sounds: Normal heart sounds.  ?Pulmonary:  ?   Effort: Pulmonary effort is normal.  ?   Breath sounds: Normal breath sounds.  ?Musculoskeletal:  ?   Cervical back: Normal range of motion.  ?Skin: ?   General: Skin is warm.  ?Neurological:  ?   General: No focal deficit present.  ?   Mental Status: She is alert.  ?Psychiatric:     ?   Mood and Affect: Mood normal.     ?   Behavior: Behavior normal.  ?   ?Assessment And Plan:  ?   ?1. Type 2 diabetes mellitus with stage  3a chronic kidney disease, with long-term current use of insulin ([place]) ?Comments: Chronic, I don't anticipate need to adjust meds. She will rto in July 2023 for her next DM chck. I will check Renal labs as well.  ?- CMP14+EGFR ?- Hemoglobin A1c ?- Protein electrophoresis, serum ? ?2. 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  ? ?3. Hypertensive nephropathy ?Comments: Chronic, well controlled. She is encouraged to follow a low sodium diet.  ? ?4. Gastroesophageal reflux disease without esophagitis ?Comments: She has taken ranitidine in the past. I will send rx famotidine 65m to pharmacy. Advised to take daily for next two weeks, then prn.  ? ?5. BMI 29.0-29.9,adult ?Comments: She is encouraged to strive for at least 150 minutes of exercise per week.  ?  ?Patient was given opportunity to ask questions. Patient verbalized understanding of the plan and was able to repeat key elements of the plan. All questions were answered to their satisfaction.  ? ?I, RMaximino Greenland MD, have reviewed all documentation for this visit. The documentation on 12/24/21 for the exam, diagnosis, procedures, and orders are all accurate and complete.  ? ?IF YOU HAVE BEEN REFERRED TO A SPECIALIST, IT MAY TAKE 1-2 WEEKS TO SCHEDULE/PROCESS THE REFERRAL. IF YOU HAVE NOT HEARD FROM US/SPECIALIST IN TWO WEEKS, PLEASE GIVE [place] CALL AT  [phone] X [phone].  ? ?THE PATIENT IS ENCOURAGED TO PRACTICE SOCIAL DISTANCING DUE TO THE COVID-19 PANDEMIC.   ?

## 2021-12-24 NOTE — Patient Instructions (Signed)

## 2021-12-25 LAB — CMP14+EGFR
ALT: 19 IU/L (ref 0–32)
AST: 29 IU/L (ref 0–40)
Albumin/Globulin Ratio: 1.8 (ref 1.2–2.2)
Albumin: 4.7 g/dL (ref 3.8–4.8)
Alkaline Phosphatase: 57 IU/L (ref 44–121)
BUN/Creatinine Ratio: 20 (ref 12–28)
BUN: 27 mg/dL (ref 8–27)
Bilirubin Total: <0.2 mg/dL (ref 0.0–1.2)
CO2: 22 mmol/L (ref 20–29)
Calcium: 9.4 mg/dL (ref 8.7–10.3)
Chloride: 100 mmol/L (ref 96–106)
Creatinine, Ser: 1.34 mg/dL — ABNORMAL HIGH (ref 0.57–1.00)
Globulin, Total: 2.6 g/dL (ref 1.5–4.5)
Glucose: 228 mg/dL — ABNORMAL HIGH (ref 70–99)
Potassium: 5.6 mmol/L — ABNORMAL HIGH (ref 3.5–5.2)
Sodium: 136 mmol/L (ref 134–144)
Total Protein: 7.3 g/dL (ref 6.0–8.5)
eGFR: 43 mL/min/{1.73_m2} — ABNORMAL LOW (ref >59)

## 2021-12-25 LAB — PROTEIN ELECTROPHORESIS, SERUM
A/G Ratio: 1.3 (ref 0.7–1.7)
Albumin ELP: 4.1 g/dL (ref 2.9–4.4)
Alpha 1: 0.2 g/dL (ref 0.0–0.4)
Alpha 2: 0.9 g/dL (ref 0.4–1.0)
Beta: 1.1 g/dL (ref 0.7–1.3)
Gamma Globulin: 1.1 g/dL (ref 0.4–1.8)
Globulin, Total: 3.2 g/dL (ref 2.2–3.9)

## 2021-12-25 LAB — HEMOGLOBIN A1C
Est. average glucose Bld gHb Est-mCnc: 157 mg/dL
Hgb A1c MFr Bld: 7.1 % — ABNORMAL HIGH (ref 4.8–5.6)

## 2021-12-27 ENCOUNTER — Other Ambulatory Visit: Payer: Self-pay | Admitting: Internal Medicine

## 2021-12-30 ENCOUNTER — Other Ambulatory Visit: Payer: Self-pay

## 2021-12-30 ENCOUNTER — Other Ambulatory Visit: Payer: 59

## 2021-12-30 DIAGNOSIS — E875 Hyperkalemia: Secondary | ICD-10-CM

## 2021-12-31 LAB — CMP14+EGFR
ALT: 19 IU/L (ref 0–32)
AST: 18 IU/L (ref 0–40)
Albumin/Globulin Ratio: 1.7 (ref 1.2–2.2)
Albumin: 4.5 g/dL (ref 3.8–4.8)
Alkaline Phosphatase: 51 IU/L (ref 44–121)
BUN/Creatinine Ratio: 21 (ref 12–28)
BUN: 23 mg/dL (ref 8–27)
Bilirubin Total: <0.2 mg/dL (ref 0.0–1.2)
CO2: 22 mmol/L (ref 20–29)
Calcium: 9.8 mg/dL (ref 8.7–10.3)
Chloride: 103 mmol/L (ref 96–106)
Creatinine, Ser: 1.09 mg/dL — ABNORMAL HIGH (ref 0.57–1.00)
Globulin, Total: 2.6 g/dL (ref 1.5–4.5)
Glucose: 98 mg/dL (ref 70–99)
Potassium: 4.6 mmol/L (ref 3.5–5.2)
Sodium: 140 mmol/L (ref 134–144)
Total Protein: 7.1 g/dL (ref 6.0–8.5)
eGFR: 55 mL/min/{1.73_m2} — ABNORMAL LOW (ref >59)

## 2022-02-07 ENCOUNTER — Other Ambulatory Visit: Payer: Self-pay | Admitting: Internal Medicine

## 2022-02-18 ENCOUNTER — Other Ambulatory Visit: Payer: Self-pay | Admitting: Internal Medicine

## 2022-03-03 ENCOUNTER — Encounter: Payer: Self-pay | Admitting: Internal Medicine

## 2022-04-18 ENCOUNTER — Other Ambulatory Visit: Payer: Self-pay | Admitting: Internal Medicine

## 2022-04-21 ENCOUNTER — Encounter: Payer: Self-pay | Admitting: Internal Medicine

## 2022-04-21 ENCOUNTER — Ambulatory Visit (INDEPENDENT_AMBULATORY_CARE_PROVIDER_SITE_OTHER): Payer: 59 | Admitting: Internal Medicine

## 2022-04-21 VITALS — BP 110/64 | HR 91 | Temp 98.4°F | Ht 64.0 in | Wt 161.0 lb

## 2022-04-21 DIAGNOSIS — Z794 Long term (current) use of insulin: Secondary | ICD-10-CM

## 2022-04-21 DIAGNOSIS — N1831 Chronic kidney disease, stage 3a: Secondary | ICD-10-CM | POA: Diagnosis not present

## 2022-04-21 DIAGNOSIS — Z0000 Encounter for general adult medical examination without abnormal findings: Secondary | ICD-10-CM | POA: Diagnosis not present

## 2022-04-21 DIAGNOSIS — E1122 Type 2 diabetes mellitus with diabetic chronic kidney disease: Secondary | ICD-10-CM

## 2022-04-21 DIAGNOSIS — F4321 Adjustment disorder with depressed mood: Secondary | ICD-10-CM

## 2022-04-21 DIAGNOSIS — I129 Hypertensive chronic kidney disease with stage 1 through stage 4 chronic kidney disease, or unspecified chronic kidney disease: Secondary | ICD-10-CM | POA: Diagnosis not present

## 2022-04-21 DIAGNOSIS — Z1211 Encounter for screening for malignant neoplasm of colon: Secondary | ICD-10-CM

## 2022-04-21 LAB — POCT URINALYSIS DIPSTICK
Blood, UA: NEGATIVE
Glucose, UA: NEGATIVE
Ketones, UA: NEGATIVE
Leukocytes, UA: NEGATIVE
Nitrite, UA: NEGATIVE
Protein, UA: POSITIVE — AB
Spec Grav, UA: >=1.03 — AB (ref 1.010–1.025)
Urobilinogen, UA: 0.2 E.U./dL
pH, UA: 5 (ref 5.0–8.0)

## 2022-04-21 NOTE — Progress Notes (Signed)
I,Tianna Badgett,acting as a Education administrator for [name], MD.,have documented all relevant documentation on the behalf of [name], MD,as directed by  [name], MD while in the presence of [name], MD.   Subjective:     Patient ID: Heather Tanner , female    DOB: Sep 03, 1956 , 66 y.o.   MRN: [id number]   Chief Complaint  Patient presents with   Annual Exam   Diabetes   Hypertension    HPI  She is here today for a full physical examination. She is no longer followed by GYN. Her last pap smear was in 2020. She reports compliance with meds. She denies headaches, chest pain and shortness of breath. Unfortunately, her sister passed last Thursday. She did have chronic illnesses.   Diabetes She presents for her follow-up diabetic visit. She has type 2 diabetes mellitus. There are no hypoglycemic associated symptoms. Pertinent negatives for diabetes include no blurred vision and no chest pain. There are no hypoglycemic complications. Diabetic complications include peripheral neuropathy and retinopathy. Risk factors for coronary artery disease include diabetes mellitus, dyslipidemia, hypertension, post-menopausal and sedentary lifestyle. She participates in exercise intermittently. An ACE inhibitor/angiotensin II receptor blocker is being taken.  Hypertension This is a chronic problem. The current episode started more than 1 year ago. The problem has been gradually improving since onset. The problem is controlled. Pertinent negatives include no blurred vision or chest pain. Past treatments include ACE inhibitors. Compliance problems include exercise.  Hypertensive end-organ damage includes retinopathy.     Past Medical History:  Diagnosis Date   Arthritis    Carpal tunnel syndrome 01/01/2016   right   Diabetes mellitus without complication (HCC)    High cholesterol    Hypertension      Family History  Problem Relation Age of Onset   Diabetes Mother    Heart  Problems Mother    Hypertension Mother    Diabetes Father    Hypertension Father    Heart Problems Father    Hypertension Sister    Stroke Sister    Hyperlipidemia Sister    Cancer Sister        Adenocarcinoma   Heart Problems Brother    Healthy Son    Healthy Daughter    Healthy Daughter      Current Outpatient Medications:    Albuterol Sulfate (PROAIR RESPICLICK) 423 (90 Base) MCG/ACT AEPB, Inhale into the lungs. prn, Disp: , Rfl:    BABY ASPIRIN PO, Take by mouth daily., Disp: , Rfl:    Blood Glucose Monitoring Suppl (ONE TOUCH ULTRA MINI) w/Device KIT, Use as instructed to check blood sugars 3 times per day 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 2 per day, Disp: , Rfl:    co-enzyme Q-10 30 MG capsule, Take 30 mg by mouth 3 (three) times daily., Disp: , Rfl:    DULoxetine (CYMBALTA) 60 MG capsule, Take 1 capsule (60 mg total) by mouth daily., Disp: 30 capsule, Rfl: 11   famotidine (PEPCID) 20 MG tablet, TAKE 1 TABLET BY MOUTH TWICE A DAY, Disp: 60 tablet, Rfl: 1   HYDROcodone-acetaminophen (NORCO) 10-325 MG tablet, Take 1 tablet by mouth 3 (three) times daily as needed., Disp: , Rfl:    Insulin Pen Needle (PEN NEEDLES) 31G X 5 MM MISC, Inject 1 each into the skin daily., Disp: 100 each, Rfl: 2   JANUMET XR [phone] MG TB24, TAKE 1 TABLET  BY MOUTH EVERY EVENING, Disp: 30 tablet, Rfl: 5   Lancets (ONETOUCH DELICA PLUS [id number]) MISC, USE TO CHECK BLOOD SUGAR THREE TIMES A DAY, Disp: 100 each, Rfl: 3   lisinopril (ZESTRIL) 10 MG tablet, TAKE ONE TABLET BY MOUTH DAILY, Disp: 90 tablet, Rfl: 1   Misc Natural Products (MAGIC MUSHROOM MIX) CAPS, Take by mouth., Disp: , Rfl:    Multiple Vitamin (MULTIVITAMIN) tablet, Take 1 tablet by mouth daily., Disp: , Rfl:    ONETOUCH ULTRA test strip, USE AS INSTRUCTED TO CHECK BLOOD SUGAR THREE TIMES A DAY, Disp: 100 strip, Rfl: 1   OVER THE COUNTER MEDICATION, Total beet chew, Disp: , Rfl:     pravastatin (PRAVACHOL) 40 MG tablet, TAKE ONE TABLET BY MOUTH DAILY ON MONDAY, WEDNESDAY, AND FRIDAY, Disp: 90 tablet, Rfl: 1   pregabalin (LYRICA) 100 MG capsule, TAKE 1 CAPSULE(S) BY MOUTH THREE TIMES A DAY, Disp: 270 capsule, Rfl: 2   temazepam (RESTORIL) 15 MG capsule, temazepam 15 mg capsule, Disp: , Rfl:    Turmeric-Ginger 150-25 MG CHEW, Chew by mouth., Disp: , Rfl:    atorvastatin (LIPITOR) 40 MG tablet, Take one tablet by mouth on Monday, Wednesday and friday, Disp: 30 tablet, Rfl: 2   TOUJEO SOLOSTAR 300 UNIT/ML Solostar Pen, INJECT 16 UNITS INTO THE SKIN AT BEDTIME, Disp: 1.5 mL, Rfl: 1   Allergies  Allergen Reactions   Latex       The patient states she uses post menopausal status for birth control. Last LMP was No LMP recorded. Patient is postmenopausal.. Negative for Dysmenorrhea. Negative for: breast discharge, breast lump(s), breast pain and breast self exam. Associated symptoms include abnormal vaginal bleeding. Pertinent negatives include abnormal bleeding (hematology), anxiety, decreased libido, depression, difficulty falling sleep, dyspareunia, history of infertility, nocturia, sexual dysfunction, sleep disturbances, urinary incontinence, urinary urgency, vaginal discharge and vaginal itching. Diet regular.The patient states her exercise level is Current Exercise Habits: Home exercise routine, Type of exercise: walking, Time (Minutes): 30, Frequency (Times/Week): 5, Weekly Exercise (Minutes/Week): 150, Intensity: Moderateintermittent. Exercise limited by: orthopedic condition(s). The patient's tobacco use is:  Social History   Tobacco Use  Smoking Status Never  Smokeless Tobacco Never  . She has been exposed to passive smoke. The patient's alcohol use is:  Social History   Substance and Sexual Activity  Alcohol Use Yes   Comment: 3 drinks monthly    Review of Systems  Constitutional: Negative.   HENT: Negative.    Eyes: Negative.  Negative for blurred vision.   Respiratory: Negative.    Cardiovascular: Negative.  Negative for chest pain.  Gastrointestinal: Negative.   Endocrine: Negative.   Genitourinary: Negative.   Musculoskeletal: Negative.   Skin: Negative.   Allergic/Immunologic: Negative.   Neurological: Negative.   Hematological: Negative.   Psychiatric/Behavioral: Negative.       Today's Vitals   04/21/22 1150  BP: 110/64  Pulse: 91  Temp: 98.4 F (36.9 C)  TempSrc: Oral  Weight: 161 lb (73 kg)  Height: 5' 4"  (1.626 m)   Body mass index is 27.64 kg/m.  Wt Readings from Last 3 Encounters:  04/21/22 161 lb (73 kg)  12/24/21 161 lb 3.2 oz (73.1 kg)  10/26/21 159 lb (72.1 kg)    Objective:  Physical Exam Vitals and nursing note reviewed.  Constitutional:      Appearance: Normal appearance.  HENT:     Head: Normocephalic and atraumatic.     Right Ear: Tympanic membrane, ear canal and external ear normal.  Left Ear: Tympanic membrane, ear canal and external ear normal.     Nose: Nose normal.     Mouth/Throat:     Mouth: Mucous membranes are moist.     Pharynx: Oropharynx is clear.  Eyes:     Extraocular Movements: Extraocular movements intact.     Conjunctiva/sclera: Conjunctivae normal.     Pupils: Pupils are equal, round, and reactive to light.  Cardiovascular:     Rate and Rhythm: Normal rate and regular rhythm.     Pulses: Normal pulses.          Dorsalis pedis pulses are 2+ on the right side and 2+ on the left side.     Heart sounds: Normal heart sounds.  Pulmonary:     Effort: Pulmonary effort is normal.     Breath sounds: Normal breath sounds.  Chest:  Breasts:    Tanner Score is 5.     Right: Normal.     Left: Normal.  Abdominal:     General: Abdomen is flat. Bowel sounds are normal.     Palpations: Abdomen is soft.  Genitourinary:    Comments: deferred Musculoskeletal:        General: Normal range of motion.     Cervical back: Normal range of motion and neck supple.  Feet:     Right foot:      Protective Sensation: 5 sites tested.  5 sites sensed.     Skin integrity: Dry skin present.     Toenail Condition: Right toenails are normal.     Left foot:     Protective Sensation: 5 sites tested.  5 sites sensed.     Skin integrity: Dry skin present.     Toenail Condition: Left toenails are normal.  Skin:    General: Skin is warm and dry.  Neurological:     General: No focal deficit present.     Mental Status: She is alert and oriented to person, place, and time.  Psychiatric:        Mood and Affect: Mood normal.        Behavior: Behavior normal.      Assessment And Plan:     1. Routine general medical examination at health care facility Comments: A full exam was performed. Importance of monthly self breast exams was discussed with the patient. PATIENT IS ADVISED TO GET 30-45 MINUTES REGULAR EXERCISE NO LESS THAN FOUR TO FIVE DAYS PER WEEK - BOTH WEIGHTBEARING EXERCISES AND AEROBIC ARE RECOMMENDED.  PATIENT IS ADVISED TO FOLLOW A HEALTHY DIET WITH AT LEAST SIX FRUITS/VEGGIES PER DAY, DECREASE INTAKE OF RED MEAT, AND TO INCREASE FISH INTAKE TO TWO DAYS PER WEEK.  MEATS/FISH SHOULD NOT BE FRIED, BAKED OR BROILED IS PREFERABLE.  IT IS ALSO IMPORTANT TO CUT BACK ON YOUR SUGAR INTAKE. PLEASE AVOID ANYTHING WITH ADDED SUGAR, CORN SYRUP OR OTHER SWEETENERS. IF YOU MUST USE A SWEETENER, YOU CAN TRY STEVIA. IT IS ALSO IMPORTANT TO AVOID ARTIFICIALLY SWEETENERS AND DIET BEVERAGES. LASTLY, I SUGGEST WEARING SPF 50 SUNSCREEN ON EXPOSED PARTS AND ESPECIALLY WHEN IN THE DIRECT SUNLIGHT FOR AN EXTENDED PERIOD OF TIME.  PLEASE AVOID FAST FOOD RESTAURANTS AND INCREASE YOUR WATER INTAKE. - CBC - Hemoglobin A1c - Lipid panel - Comprehensive metabolic panel with eGFR (no eGFR if sent to [facility])  2. Type 2 diabetes mellitus with stage 3a chronic kidney disease, with long-term current use of insulin (HCC) Comments: Diabetic foot exam was performed.  She will rto in 4 months for  re-evaluation.  I  DISCUSSED WITH THE PATIENT AT LENGTH REGARDING THE GOALS OF GLYCEMIC CONTROL AND POSSIBLE LONG-TERM COMPLICATIONS.  I  ALSO STRESSED THE IMPORTANCE OF COMPLIANCE WITH HOME GLUCOSE MONITORING, DIETARY RESTRICTIONS INCLUDING AVOIDANCE OF SUGARY DRINKS/PROCESSED FOODS,  ALONG WITH REGULAR EXERCISE.  I  ALSO STRESSED THE IMPORTANCE OF ANNUAL EYE EXAMS, SELF FOOT CARE AND COMPLIANCE WITH OFFICE VISITS. - EKG 12-Lead - POCT Urinalysis Dipstick (81002) - Microalbumin / Creatinine Urine Ratio  3. Hypertensive nephropathy Comments: Chronic, well controlled. EKG performed, NSR w/o acute changes. She will c/w lisinopril 9m daily. She is encouraged to follow low sodium diet. She will rto in 4-6 months for re-evaluation.   4. Screen for colon cancer Comments: Last colonoscopy performed April 2013. I [facility]  - Ambulatory referral to Gastroenterology  5. Grief Comments: She declines referral for grief counseling at this time.   Patient was given opportunity to ask questions. Patient verbalized understanding of the plan and was able to repeat key elements of the plan. All questions were answered to their satisfaction.   I, RMaximino Greenland MD, have reviewed all documentation for this visit. The documentation on 04/21/22 for the exam, diagnosis, procedures, and orders are all accurate and complete.   THE PATIENT IS ENCOURAGED TO PRACTICE SOCIAL DISTANCING DUE TO THE COVID-19 PANDEMIC.

## 2022-04-21 NOTE — Patient Instructions (Signed)

## 2022-04-22 LAB — COMPREHENSIVE METABOLIC PANEL
ALT: 27 IU/L (ref 0–32)
AST: 26 IU/L (ref 0–40)
Albumin/Globulin Ratio: 1.4 (ref 1.2–2.2)
Albumin: 4.4 g/dL (ref 3.9–4.9)
Alkaline Phosphatase: 65 IU/L (ref 44–121)
BUN/Creatinine Ratio: 13 (ref 12–28)
BUN: 17 mg/dL (ref 8–27)
Bilirubin Total: <0.2 mg/dL (ref 0.0–1.2)
CO2: 21 mmol/L (ref 20–29)
Calcium: 9.8 mg/dL (ref 8.7–10.3)
Chloride: 102 mmol/L (ref 96–106)
Creatinine, Ser: 1.35 mg/dL — ABNORMAL HIGH (ref 0.57–1.00)
Globulin, Total: 3.2 g/dL (ref 1.5–4.5)
Glucose: 162 mg/dL — ABNORMAL HIGH (ref 70–99)
Potassium: 4.6 mmol/L (ref 3.5–5.2)
Sodium: 139 mmol/L (ref 134–144)
Total Protein: 7.6 g/dL (ref 6.0–8.5)
eGFR: 43 mL/min/{1.73_m2} — ABNORMAL LOW (ref >59)

## 2022-04-22 LAB — CBC
Hematocrit: 35.1 % (ref 34.0–46.6)
Hemoglobin: 11.7 g/dL (ref 11.1–15.9)
MCH: 27.3 pg (ref 26.6–33.0)
MCHC: 33.3 g/dL (ref 31.5–35.7)
MCV: 82 fL (ref 79–97)
Platelets: 306 10*3/uL (ref 150–450)
RBC: 4.29 x10E6/uL (ref 3.77–5.28)
RDW: 13.6 % (ref 11.7–15.4)
WBC: 6.9 10*3/uL (ref 3.4–10.8)

## 2022-04-22 LAB — LIPID PANEL
Chol/HDL Ratio: 3.1 ratio (ref 0.0–4.4)
Cholesterol, Total: 153 mg/dL (ref 100–199)
HDL: 50 mg/dL (ref >39)
LDL Chol Calc (NIH): 87 mg/dL (ref 0–99)
Triglycerides: 86 mg/dL (ref 0–149)
VLDL Cholesterol Cal: 16 mg/dL (ref 5–40)

## 2022-04-22 LAB — MICROALBUMIN / CREATININE URINE RATIO
Creatinine, Urine: 353.3 mg/dL
Microalb/Creat Ratio: 16 mg/g creat (ref 0–29)
Microalbumin, Urine: 58 ug/mL

## 2022-04-22 LAB — HEMOGLOBIN A1C
Est. average glucose Bld gHb Est-mCnc: 169 mg/dL
Hgb A1c MFr Bld: 7.5 % — ABNORMAL HIGH (ref 4.8–5.6)

## 2022-04-23 ENCOUNTER — Other Ambulatory Visit: Payer: Self-pay

## 2022-04-23 IMAGING — US US RENAL
1 series · 14 of 25 positions shown · non-contrast
Comparison: None.

CLINICAL DATA: Initial evaluation for chronic kidney disease, stage
III A.

EXAM:
RENAL / URINARY TRACT ULTRASOUND COMPLETE

[Series 1: us renal · 0.23mm/px · 14 of 34 slices shown]
[im 1/34]
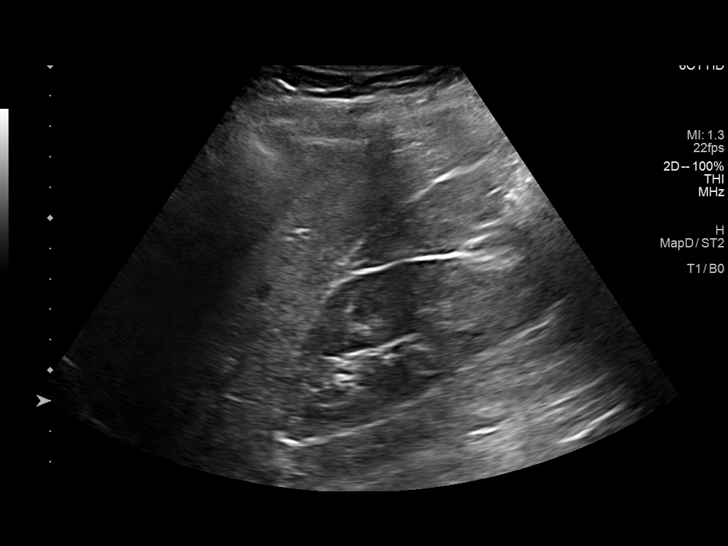
[im 3/34]
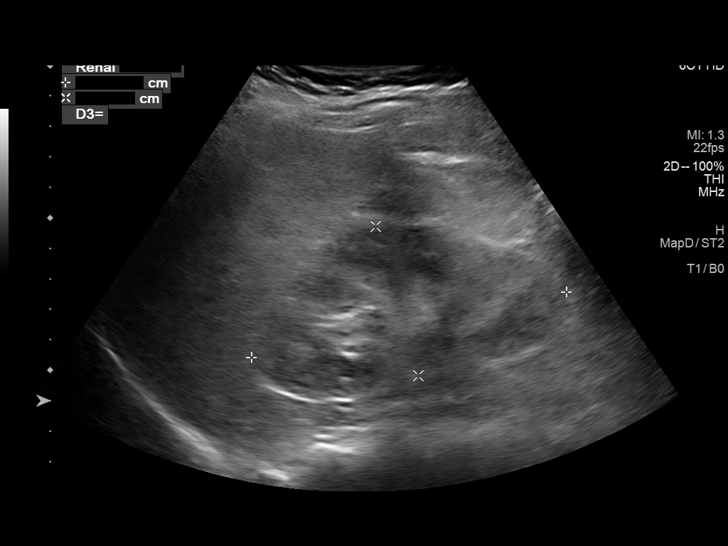
[im 6/34]
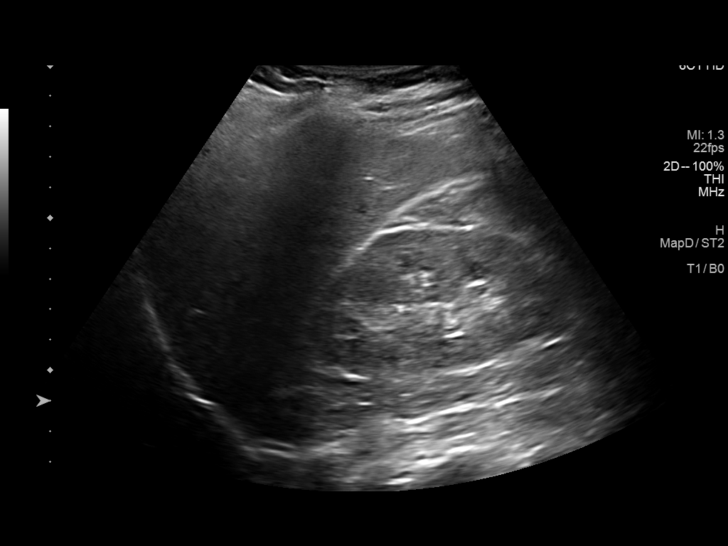
[im 9/34]
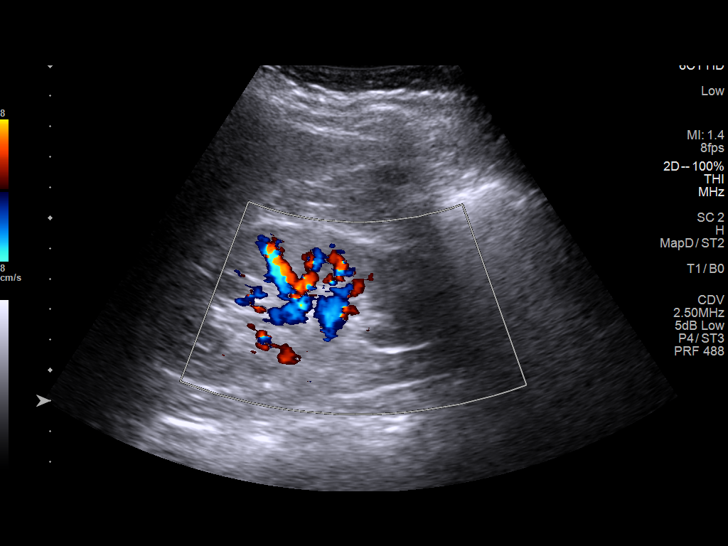
[im 12/34]
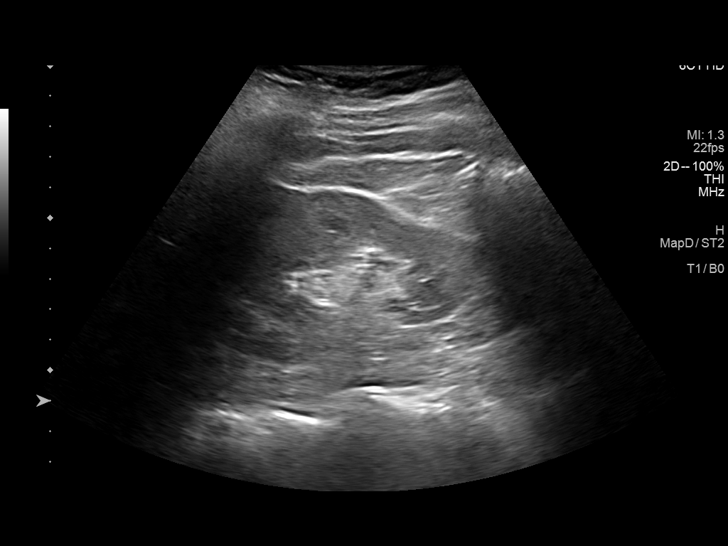
[im 13/34]
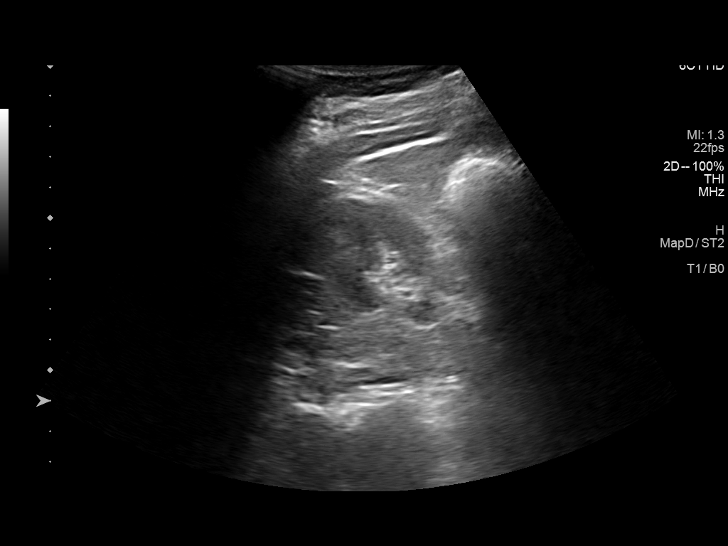
[im 16/34]
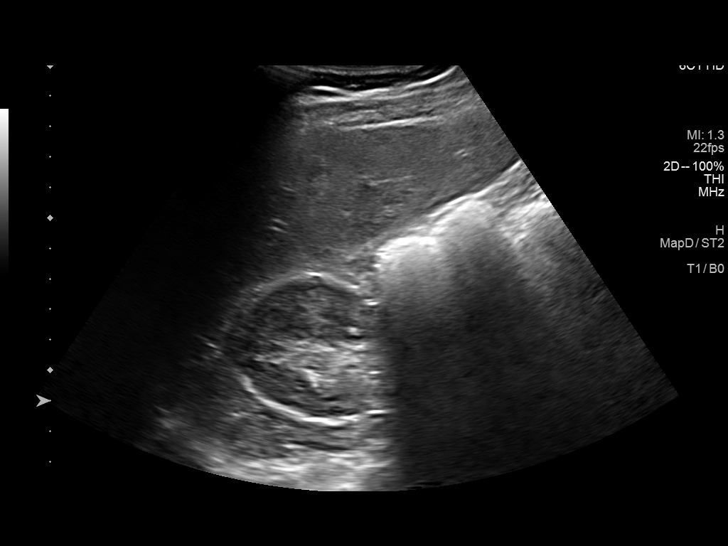
[im 18/34]
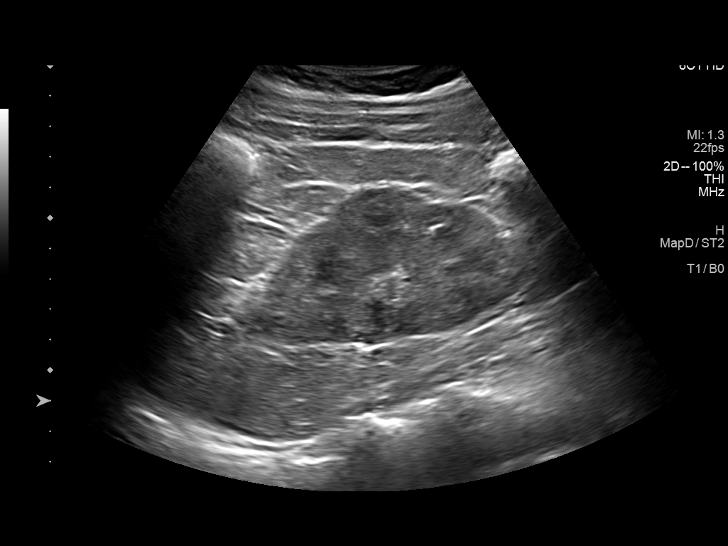
[im 21/34]
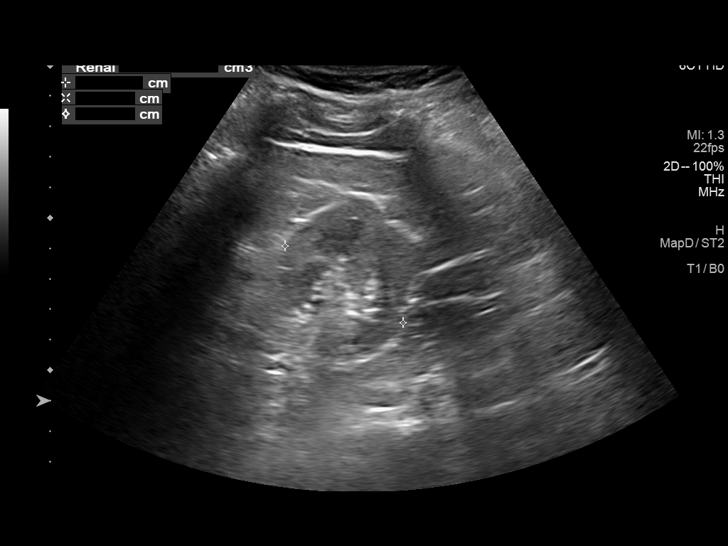
[im 23/34]
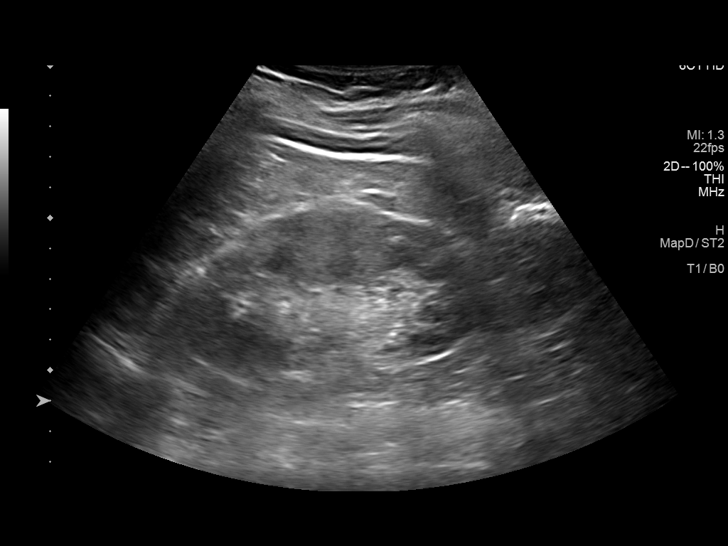
[im 25/34]
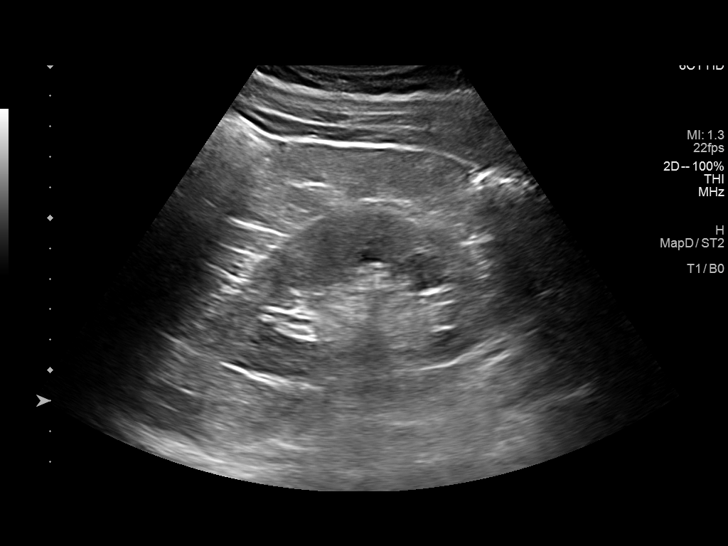
[im 28/34]
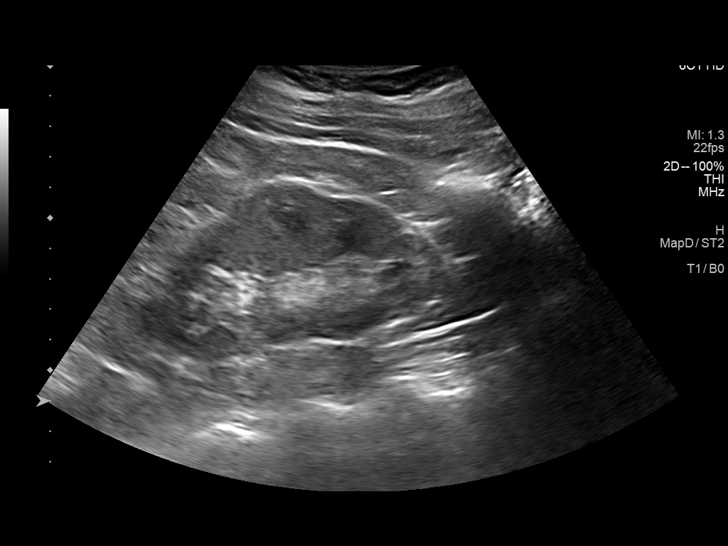
[im 31/34]
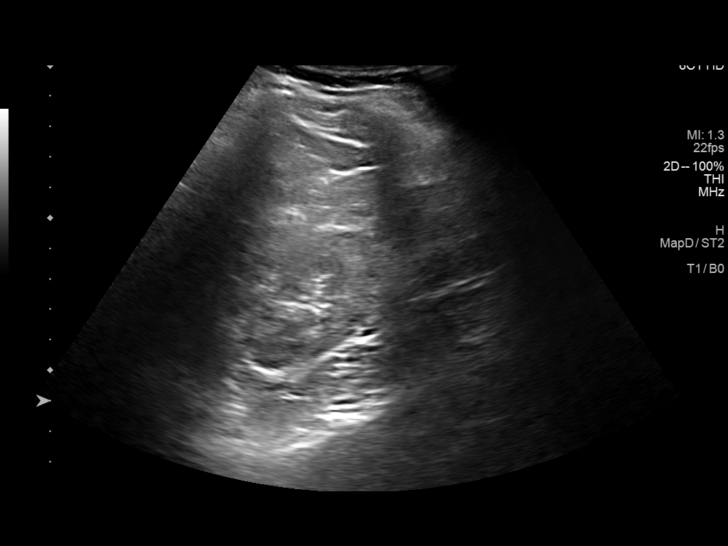
[im 34/34]
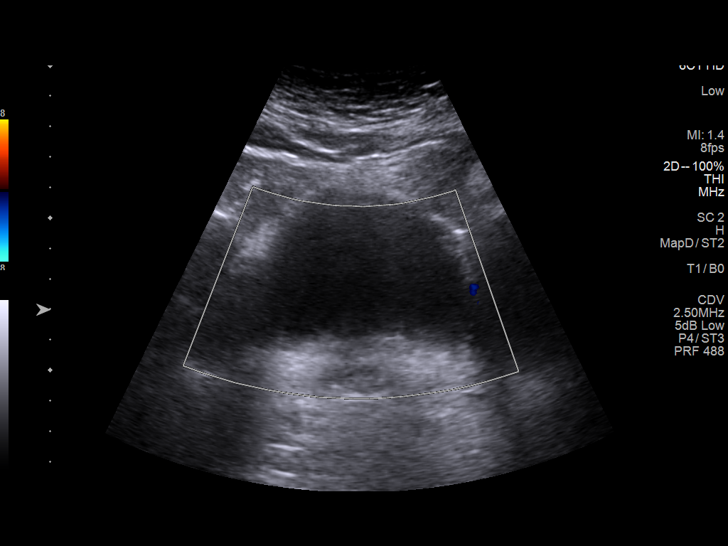

[14 of 25 positions shown; findings below may reference images not displayed]

FINDINGS: Right Kidney:

Renal measurements: 10.6 x 5.1 x 4.7 cm = volume: 132.3 mL. Mildly
increased echogenicity seen within the renal parenchyma. No
significant cortical thinning. No nephrolithiasis or hydronephrosis.
No focal renal mass.

Left Kidney:

Renal measurements: 10.7 x 5.9 x 4.6 cm = volume: 153.1 mL. Mildly
increased echogenicity seen within the renal parenchyma. No
significant cortical thinning. No nephrolithiasis or hydronephrosis.
No focal renal mass.

Bladder:

Appears normal for degree of bladder distention.

Other:

None.
IMPRESSION: 1. Increased echogenicity within the renal parenchyma, consistent
with medical renal disease.
2. No hydronephrosis or other significant finding.

## 2022-04-23 MED ORDER — ATORVASTATIN CALCIUM 40 MG PO TABS: ORAL_TABLET | ORAL | 2 refills | Status: DC

## 2022-04-25 ENCOUNTER — Other Ambulatory Visit: Payer: Self-pay | Admitting: Internal Medicine

## 2022-06-02 ENCOUNTER — Encounter: Payer: Self-pay | Admitting: Internal Medicine

## 2022-06-02 ENCOUNTER — Other Ambulatory Visit: Payer: Self-pay

## 2022-06-02 MED ORDER — DEXCOM G7 SENSOR MISC: 2 refills | Status: DC

## 2022-06-09 ENCOUNTER — Other Ambulatory Visit: Payer: Self-pay | Admitting: Internal Medicine

## 2022-06-17 ENCOUNTER — Other Ambulatory Visit: Payer: Self-pay | Admitting: Internal Medicine

## 2022-06-21 ENCOUNTER — Other Ambulatory Visit: Payer: Self-pay | Admitting: Internal Medicine

## 2022-06-21 DIAGNOSIS — Z1231 Encounter for screening mammogram for malignant neoplasm of breast: Secondary | ICD-10-CM

## 2022-07-08 ENCOUNTER — Other Ambulatory Visit: Payer: Self-pay | Admitting: Internal Medicine

## 2022-07-08 MED ORDER — PREGABALIN 100 MG PO CAPS: ORAL_CAPSULE | ORAL | 2 refills | Status: DC

## 2022-08-04 ENCOUNTER — Other Ambulatory Visit: Payer: Self-pay | Admitting: Internal Medicine

## 2022-08-06 ENCOUNTER — Ambulatory Visit
Admission: RE | Admit: 2022-08-06 | Discharge: 2022-08-06 | Disposition: A | Discharge status: Home / Self Care | Payer: 59 | Source: Ambulatory Visit | Attending: Internal Medicine | Admitting: Internal Medicine

## 2022-08-06 DIAGNOSIS — Z1231 Encounter for screening mammogram for malignant neoplasm of breast: Secondary | ICD-10-CM

## 2022-08-16 ENCOUNTER — Other Ambulatory Visit: Payer: Self-pay | Admitting: Internal Medicine

## 2022-08-23 ENCOUNTER — Ambulatory Visit: Payer: 59 | Admitting: Internal Medicine

## 2022-08-23 ENCOUNTER — Encounter: Payer: Self-pay | Admitting: Internal Medicine

## 2022-08-23 VITALS — BP 128/70 | HR 98 | Temp 98.6°F | Ht 64.0 in | Wt 166.8 lb

## 2022-08-23 DIAGNOSIS — E1122 Type 2 diabetes mellitus with diabetic chronic kidney disease: Secondary | ICD-10-CM | POA: Diagnosis not present

## 2022-08-23 DIAGNOSIS — I129 Hypertensive chronic kidney disease with stage 1 through stage 4 chronic kidney disease, or unspecified chronic kidney disease: Secondary | ICD-10-CM | POA: Diagnosis not present

## 2022-08-23 DIAGNOSIS — N1831 Chronic kidney disease, stage 3a: Secondary | ICD-10-CM

## 2022-08-23 DIAGNOSIS — G8929 Other chronic pain: Secondary | ICD-10-CM

## 2022-08-23 DIAGNOSIS — Z6828 Body mass index (BMI) 28.0-28.9, adult: Secondary | ICD-10-CM

## 2022-08-23 DIAGNOSIS — Z794 Long term (current) use of insulin: Secondary | ICD-10-CM

## 2022-08-23 DIAGNOSIS — R011 Cardiac murmur, unspecified: Secondary | ICD-10-CM | POA: Diagnosis not present

## 2022-08-23 DIAGNOSIS — E663 Overweight: Secondary | ICD-10-CM

## 2022-08-23 DIAGNOSIS — M5441 Lumbago with sciatica, right side: Secondary | ICD-10-CM

## 2022-08-23 DIAGNOSIS — M5442 Lumbago with sciatica, left side: Secondary | ICD-10-CM

## 2022-08-23 DIAGNOSIS — R0982 Postnasal drip: Secondary | ICD-10-CM

## 2022-08-23 DIAGNOSIS — Z2821 Immunization not carried out because of patient refusal: Secondary | ICD-10-CM

## 2022-08-23 MED ORDER — CETIRIZINE HCL 10 MG PO TABS: 10.0000 mg | ORAL_TABLET | Freq: Every day | ORAL | 2 refills | Status: DC

## 2022-08-23 NOTE — Progress Notes (Signed)
Heather Tanner,acting as a Education administrator for [name], MD.,have documented all relevant documentation on the behalf of [name], MD,as directed by  [name], MD while in the presence of [name], MD.    Subjective:     Patient ID: Heather Tanner , female    DOB: [birth year] , 66 y.o.   MRN: [id number]   Chief Complaint  Patient presents with   Diabetes   Hypertension    HPI  Patient presents today for a DM/HTN check, patient reports compliance with medications. She denies having chest pain, palpitations and shortness of breath.   BP Readings from Last 3 Encounters: 08/23/22 : 128/70 04/21/22 : 110/64 12/24/21 : 112/70    Diabetes She presents for her follow-up diabetic visit. She has type 2 diabetes mellitus. There are no hypoglycemic associated symptoms. Pertinent negatives for diabetes include no blurred vision and no chest pain. There are no hypoglycemic complications. Diabetic complications include peripheral neuropathy and retinopathy. Risk factors for coronary artery disease include diabetes mellitus, dyslipidemia, hypertension, post-menopausal and sedentary lifestyle. She participates in exercise intermittently. An ACE inhibitor/angiotensin II receptor blocker is being taken.  Hypertension This is a chronic problem. The current episode started more than 1 year ago. The problem has been gradually improving since onset. The problem is controlled. Pertinent negatives include no blurred vision or chest pain. Past treatments include ACE inhibitors. Compliance problems include exercise.  Hypertensive end-organ damage includes retinopathy.     Past Medical History:  Diagnosis Date   Arthritis    Carpal tunnel syndrome 01/01/2016   right   Diabetes mellitus without complication (HCC)    High cholesterol    Hypertension      Family History  Problem Relation Age of Onset   Diabetes Mother    Heart Problems Mother    Hypertension Mother     Diabetes Father    Hypertension Father    Heart Problems Father    Hypertension Sister    Stroke Sister    Hyperlipidemia Sister    Cancer Sister        Adenocarcinoma   Heart Problems Brother    Healthy Son    Healthy Daughter    Healthy Daughter      Current Outpatient Medications:    atorvastatin (LIPITOR) 40 MG tablet, Take one tablet by mouth on Monday, Wednesday and friday, Disp: 30 tablet, Rfl: 2   BABY ASPIRIN PO, Take by mouth daily., Disp: , Rfl:    Blood Glucose Monitoring Suppl (ONE TOUCH ULTRA MINI) w/Device KIT, Use as instructed to check blood sugars 3 times per day 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 2 per day, Disp: , Rfl:    cetirizine (ZYRTEC ALLERGY) 10 MG tablet, Take 1 tablet (10 mg total) by mouth daily., Disp: 30 tablet, Rfl: 2   co-enzyme Q-10 30 MG capsule, Take 30 mg by mouth 3 (three) times daily., Disp: , Rfl:    Continuous Blood Gluc Sensor (DEXCOM G7 SENSOR) MISC, Use as directed, Disp: 3 each, Rfl: 2   DULoxetine (CYMBALTA) 60 MG capsule, Take 1 capsule (60 mg total) by mouth daily., Disp: 30 capsule, Rfl: 11   famotidine (PEPCID) 20 MG tablet, TAKE 1 TABLET BY MOUTH TWICE A DAY, Disp: 60 tablet, Rfl: 1   HYDROcodone-acetaminophen (NORCO) 10-325 MG tablet, Take 1 tablet by mouth 3 (three) times daily as needed., Disp: , Rfl:  Insulin Pen Needle (PEN NEEDLES) 31G X 5 MM MISC, Inject 1 each into the skin daily., Disp: 100 each, Rfl: 2   JANUMET XR [phone] MG TB24, TAKE 1 TABLET BY MOUTH EVERY EVENING, Disp: 30 tablet, Rfl: 5   Lancets (ONETOUCH DELICA PLUS [id number]) MISC, USE TO CHECK BLOOD SUGAR THREE TIMES A DAY, Disp: 100 each, Rfl: 3   lisinopril (ZESTRIL) 10 MG tablet, TAKE 1 TABLET BY MOUTH DAILY, Disp: 30 tablet, Rfl: 1   Misc Natural Products (MAGIC MUSHROOM MIX) CAPS, Take by mouth., Disp: , Rfl:    Multiple Vitamin (MULTIVITAMIN) tablet, Take 1 tablet by mouth daily., Disp: , Rfl:     ONETOUCH ULTRA test strip, USE AS INSTRUCTED TO CHECK BLOOD SUGAR THREE TIMES A DAY, Disp: 100 strip, Rfl: 1   OVER THE COUNTER MEDICATION, Total beet chew, Disp: , Rfl:    pravastatin (PRAVACHOL) 40 MG tablet, TAKE ONE TABLET BY MOUTH DAILY ON MONDAY, WEDNESDAY, AND FRIDAY, Disp: 90 tablet, Rfl: 1   pregabalin (LYRICA) 100 MG capsule, TAKE 1 CAPSULE(S) BY MOUTH THREE TIMES A DAY, Disp: 270 capsule, Rfl: 2   temazepam (RESTORIL) 15 MG capsule, temazepam 15 mg capsule, Disp: , Rfl:    TOUJEO SOLOSTAR 300 UNIT/ML Solostar Pen, INJECT 16 UNITS INTO THE SKIN AT BEDTIME, Disp: 1.5 mL, Rfl: 1   Turmeric-Ginger 150-25 MG CHEW, Chew by mouth., Disp: , Rfl:    Albuterol Sulfate (PROAIR RESPICLICK) 456 (90 Base) MCG/ACT AEPB, Inhale into the lungs. prn (Patient not taking: Reported on 08/23/2022), Disp: , Rfl:    Allergies  Allergen Reactions   Latex      Review of Systems  Constitutional: Negative.   HENT:  Positive for postnasal drip.        She c/o dry cough, no fever/chills. She has not been taking OTC meds. No ill contacts.   Eyes:  Negative for blurred vision.  Respiratory:  Positive for cough.   Cardiovascular: Negative.  Negative for chest pain.  Gastrointestinal: Negative.   Musculoskeletal:  Positive for back pain.       She has chronic back pain. Has h/o sciatica. Has issues after being seated or standing for long periods of time. No other triggering factors. Pain radiates down the back of both legs. She denies having LE weakness, fecal incontinence, urinary incontinence.   Neurological: Negative.   Psychiatric/Behavioral: Negative.       Today's Vitals   08/23/22 1131  BP: 128/70  Pulse: 98  Temp: 98.6 F (37 C)  TempSrc: Oral  Weight: 166 lb 12.8 oz (75.7 kg)  Height: _0  (1.626 m)  PainSc: 7   PainLoc: Leg   Body mass index is 28.63 kg/m.  Wt Readings from Last 3 Encounters:  08/23/22 166 lb 12.8 oz (75.7 kg)  04/21/22 161 lb (73 kg)  12/24/21 161 lb 3.2 oz (73.1  kg)    Objective:  Physical Exam Vitals and nursing note reviewed.  Constitutional:      Appearance: Normal appearance.  HENT:     Head: Normocephalic and atraumatic.     Nose:     Comments: Masked     Mouth/Throat:     Comments: Masked  Eyes:     Extraocular Movements: Extraocular movements intact.  Cardiovascular:     Rate and Rhythm: Normal rate and regular rhythm.     Heart sounds: Murmur heard.  Pulmonary:     Effort: Pulmonary effort is normal.     Breath sounds: Normal breath  sounds.  Musculoskeletal:     Cervical back: Normal range of motion.  Skin:    General: Skin is warm.  Neurological:     General: No focal deficit present.     Mental Status: She is alert.  Psychiatric:        Mood and Affect: Mood normal.        Behavior: Behavior normal.         Assessment And Plan:     1. Type 2 diabetes mellitus with stage 3a chronic kidney disease, with long-term current use of insulin (HCC) Comments: Chronic, I will check labs as below. I will request her most recent eye exam from [facility] exam. - Hemoglobin A1c - BMP8+eGFR  2. Hypertensive nephropathy Comments: Chronic, well controlled. She will c/w current meds. She is encouraged to follow low sodium diet.  3. Heart murmur Comments: I will refer her for 2d-echocardiogram. She agrees with treatment plan. - ECHOCARDIOGRAM COMPLETE; Future  4. Postnasal drip Comments: I will send rx cetirizine to use nightly prn. She will let me know if her sx persist.  5. Chronic bilateral low back pain with bilateral sciatica Comments: Chronic, she is interested in back brace. Paperwork will be completed.  6. Overweight with body mass index (BMI) of 28 to 28.9 in adult Comments: She is encouraged to aim for at least 150 minutes of exercise per week.  7. Herpes zoster vaccination declined Comments: She also declines flu vaccine.   Patient was given opportunity to ask questions. Patient verbalized understanding of the  plan and was able to repeat key elements of the plan. All questions were answered to their satisfaction.   I, [name], MD, have reviewed all documentation for this visit. The documentation on 08/23/22 for the exam, diagnosis, procedures, and orders are all accurate and complete.   IF YOU HAVE BEEN REFERRED TO A SPECIALIST, IT MAY TAKE 1-2 WEEKS TO SCHEDULE/PROCESS THE REFERRAL. IF YOU HAVE NOT HEARD FROM US/SPECIALIST IN TWO WEEKS, PLEASE GIVE [place] A CALL AT [phone].   THE PATIENT IS ENCOURAGED TO PRACTICE SOCIAL DISTANCING DUE TO THE COVID-19 PANDEMIC.

## 2022-08-23 NOTE — Patient Instructions (Signed)

## 2022-08-24 LAB — BMP8+EGFR
BUN/Creatinine Ratio: 13 (ref 12–28)
BUN: 15 mg/dL (ref 8–27)
CO2: 23 mmol/L (ref 20–29)
Calcium: 9.8 mg/dL (ref 8.7–10.3)
Chloride: 102 mmol/L (ref 96–106)
Creatinine, Ser: 1.12 mg/dL — ABNORMAL HIGH (ref 0.57–1.00)
Glucose: 104 mg/dL — ABNORMAL HIGH (ref 70–99)
Potassium: 4.7 mmol/L (ref 3.5–5.2)
Sodium: 140 mmol/L (ref 134–144)
eGFR: 54 mL/min/{1.73_m2} — ABNORMAL LOW (ref >59)

## 2022-08-24 LAB — HEMOGLOBIN A1C
Est. average glucose Bld gHb Est-mCnc: 189 mg/dL
Hgb A1c MFr Bld: 8.2 % — ABNORMAL HIGH (ref 4.8–5.6)

## 2022-08-25 ENCOUNTER — Encounter: Payer: Self-pay | Admitting: Internal Medicine

## 2022-09-01 ENCOUNTER — Other Ambulatory Visit: Payer: Self-pay | Admitting: Neurology

## 2022-09-01 ENCOUNTER — Other Ambulatory Visit: Payer: Self-pay | Admitting: Internal Medicine

## 2022-09-01 MED ORDER — ALBUTEROL SULFATE 108 (90 BASE) MCG/ACT IN AEPB: INHALATION_SPRAY | RESPIRATORY_TRACT | 3 refills | Status: DC

## 2022-09-08 ENCOUNTER — Other Ambulatory Visit: Payer: Self-pay

## 2022-09-08 ENCOUNTER — Ambulatory Visit (HOSPITAL_COMMUNITY)
Admission: RE | Admit: 2022-09-08 | Discharge: 2022-09-08 | Disposition: A | Discharge status: Home / Self Care | Payer: 59 | Source: Ambulatory Visit | Attending: Internal Medicine | Admitting: Internal Medicine

## 2022-09-08 DIAGNOSIS — R011 Cardiac murmur, unspecified: Secondary | ICD-10-CM | POA: Insufficient documentation

## 2022-09-08 DIAGNOSIS — I10 Essential (primary) hypertension: Secondary | ICD-10-CM | POA: Diagnosis not present

## 2022-09-08 DIAGNOSIS — E785 Hyperlipidemia, unspecified: Secondary | ICD-10-CM | POA: Insufficient documentation

## 2022-09-08 DIAGNOSIS — E119 Type 2 diabetes mellitus without complications: Secondary | ICD-10-CM | POA: Insufficient documentation

## 2022-09-08 LAB — ECHOCARDIOGRAM COMPLETE
AR max vel: 1.71 cm2
AV Area VTI: 1.66 cm2
AV Area mean vel: 2.15 cm2
AV Mean grad: 4 mmHg
AV Peak grad: 9.9 mmHg
Ao pk vel: 1.58 m/s
Area-P 1/2: 3.15 cm2
S' Lateral: 2.3 cm

## 2022-09-08 MED ORDER — ALBUTEROL SULFATE 108 (90 BASE) MCG/ACT IN AEPB: INHALATION_SPRAY | RESPIRATORY_TRACT | 3 refills | Status: DC

## 2022-09-18 ENCOUNTER — Other Ambulatory Visit: Payer: Self-pay | Admitting: Internal Medicine

## 2022-10-01 ENCOUNTER — Other Ambulatory Visit: Payer: Self-pay | Admitting: Internal Medicine

## 2022-10-14 ENCOUNTER — Other Ambulatory Visit: Payer: Self-pay | Admitting: Internal Medicine

## 2022-10-28 LAB — HM COLONOSCOPY

## 2022-11-04 ENCOUNTER — Other Ambulatory Visit: Payer: Self-pay | Admitting: Internal Medicine

## 2022-11-06 ENCOUNTER — Other Ambulatory Visit: Payer: Self-pay | Admitting: Internal Medicine

## 2022-11-18 ENCOUNTER — Other Ambulatory Visit: Payer: Self-pay | Admitting: Internal Medicine

## 2022-11-26 ENCOUNTER — Other Ambulatory Visit: Payer: Self-pay | Admitting: Internal Medicine

## 2022-12-11 ENCOUNTER — Other Ambulatory Visit: Payer: Self-pay | Admitting: Internal Medicine

## 2022-12-27 ENCOUNTER — Ambulatory Visit: Payer: 59 | Admitting: Internal Medicine

## 2022-12-27 ENCOUNTER — Encounter: Payer: Self-pay | Admitting: Internal Medicine

## 2022-12-27 VITALS — BP 128/80 | HR 99 | Temp 98.2°F | Ht 64.0 in | Wt 171.8 lb

## 2022-12-27 DIAGNOSIS — Z794 Long term (current) use of insulin: Secondary | ICD-10-CM

## 2022-12-27 DIAGNOSIS — I129 Hypertensive chronic kidney disease with stage 1 through stage 4 chronic kidney disease, or unspecified chronic kidney disease: Secondary | ICD-10-CM

## 2022-12-27 DIAGNOSIS — E1122 Type 2 diabetes mellitus with diabetic chronic kidney disease: Secondary | ICD-10-CM | POA: Diagnosis not present

## 2022-12-27 DIAGNOSIS — M5116 Intervertebral disc disorders with radiculopathy, lumbar region: Secondary | ICD-10-CM

## 2022-12-27 DIAGNOSIS — N1831 Chronic kidney disease, stage 3a: Secondary | ICD-10-CM

## 2022-12-27 DIAGNOSIS — E113552 Type 2 diabetes mellitus with stable proliferative diabetic retinopathy, left eye: Secondary | ICD-10-CM

## 2022-12-27 DIAGNOSIS — Z6829 Body mass index (BMI) 29.0-29.9, adult: Secondary | ICD-10-CM

## 2022-12-27 MED ORDER — CETIRIZINE HCL 10 MG PO TABS: 10.0000 mg | ORAL_TABLET | Freq: Every day | ORAL | 1 refills | Status: DC

## 2022-12-27 MED ORDER — CETIRIZINE HCL 10 MG PO TABS: 10.0000 mg | ORAL_TABLET | Freq: Every day | ORAL | 2 refills | Status: DC

## 2022-12-27 NOTE — Progress Notes (Signed)
I,Victoria T Hamilton,acting as a scribe for [name], MD.,have documented all relevant documentation on the behalf of [name], MD,as directed by  [name], MD while in the presence of [name], MD.    Subjective:     Patient ID: Heather Tanner , female    DOB: 1955/11/01 , 67 y.o.   MRN: [id number]   Chief Complaint  Patient presents with   Diabetes   Hypertension    HPI  Patient presents today for a DM/HTN check, patient reports compliance with medications. She denies having chest pain, palpitations and shortness of breath.   Unfortunately, today she is having a sciatica flare. She is followed by Orthopedics. She has yet to take anything for relief today. She is prescribed tramadol and hydrocodone by Ortho. She recently returned from a trip to [place] and thinks she triggered her sx by putting her luggage back in place.     Diabetes She presents for her follow-up diabetic visit. She has type 2 diabetes mellitus. There are no hypoglycemic associated symptoms. Pertinent negatives for diabetes include no blurred vision and no chest pain. There are no hypoglycemic complications. Diabetic complications include peripheral neuropathy and retinopathy. Risk factors for coronary artery disease include diabetes mellitus, dyslipidemia, hypertension, post-menopausal and sedentary lifestyle. She participates in exercise intermittently. An ACE inhibitor/angiotensin II receptor blocker is being taken.  Hypertension This is a chronic problem. The current episode started more than 1 year ago. The problem has been gradually improving since onset. The problem is controlled. Pertinent negatives include no blurred vision or chest pain. Past treatments include ACE inhibitors. Compliance problems include exercise.  Hypertensive end-organ damage includes retinopathy.     Past Medical History:  Diagnosis Date   Arthritis    Carpal tunnel syndrome 01/01/2016   right    Diabetes mellitus without complication (HCC)    High cholesterol    Hypertension      Family History  Problem Relation Age of Onset   Diabetes Mother    Heart Problems Mother    Hypertension Mother    Diabetes Father    Hypertension Father    Heart Problems Father    Hypertension Sister    Stroke Sister    Hyperlipidemia Sister    Cancer Sister        Adenocarcinoma   Heart Problems Brother    Healthy Son    Healthy Daughter    Healthy Daughter      Current Outpatient Medications:    Albuterol Sulfate (PROAIR RESPICLICK) [id number] (90 Base) MCG/ACT AEPB, 2 puffs q6h prn, Disp: 1 each, Rfl: 3   atorvastatin (LIPITOR) 40 MG tablet, TAKE 1 TABLET BY MOUTH ON MONDAY, WEDNESDAY, AND FRIDAY, Disp: 30 tablet, Rfl: 2   BABY ASPIRIN PO, Take by mouth daily., Disp: , Rfl:    Blood Glucose Monitoring Suppl (ONE TOUCH ULTRA MINI) w/Device KIT, Use as instructed to check blood sugars 3 times per day 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 2 per day, Disp: , Rfl:    co-enzyme Q-10 30 MG capsule, Take 30 mg by mouth 3 (three) times daily., Disp: , Rfl:    Continuous Blood Gluc Sensor (DEXCOM G7 SENSOR) MISC, USE AS DIRECTED, Disp: 3 each, Rfl: 2   DULoxetine (CYMBALTA) 60 MG capsule, TAKE ONE CAPSULE BY MOUTH DAILY, Disp: 30 capsule, Rfl: 11   famotidine (PEPCID) 20 MG tablet, TAKE 1 TABLET BY MOUTH  TWICE A DAY, Disp: 60 tablet, Rfl: 1   HYDROcodone-acetaminophen (NORCO) 10-325 MG tablet, Take 1 tablet by mouth 3 (three) times daily as needed., Disp: , Rfl:    Insulin Pen Needle (PEN NEEDLES) 31G X 5 MM MISC, Inject 1 each into the skin daily., Disp: 100 each, Rfl: 2   JANUMET XR [phone] MG TB24, TAKE 1 TABLET BY MOUTH EVERY EVENING, Disp: 30 tablet, Rfl: 5   Lancets (ONETOUCH DELICA PLUS [id number]) MISC, USE TO CHECK BLOOD SUGAR THREE TIMES A DAY, Disp: 100 each, Rfl: 3   lisinopril (ZESTRIL) 10 MG tablet, TAKE 1 TABLET BY MOUTH DAILY, Disp: 30  tablet, Rfl: 1   Misc Natural Products (MAGIC MUSHROOM MIX) CAPS, Take by mouth., Disp: , Rfl:    Multiple Vitamin (MULTIVITAMIN) tablet, Take 1 tablet by mouth daily., Disp: , Rfl:    ONETOUCH ULTRA test strip, USE AS INSTRUCTED TO CHECK BLOOD SUGAR THREE TIMES A DAY, Disp: 100 strip, Rfl: 1   OVER THE COUNTER MEDICATION, Total beet chew, Disp: , Rfl:    pravastatin (PRAVACHOL) 40 MG tablet, TAKE ONE TABLET BY MOUTH DAILY ON MONDAY, WEDNESDAY, AND FRIDAY, Disp: 90 tablet, Rfl: 1   pregabalin (LYRICA) 100 MG capsule, TAKE 1 CAPSULE(S) BY MOUTH THREE TIMES A DAY, Disp: 270 capsule, Rfl: 2   temazepam (RESTORIL) 15 MG capsule, temazepam 15 mg capsule, Disp: , Rfl:    TOUJEO SOLOSTAR 300 UNIT/ML Solostar Pen, INJECT 16 UNITS INTO THE SKIN AT BEDTIME, Disp: 1.5 mL, Rfl: 1   Turmeric-Ginger 150-25 MG CHEW, Chew by mouth., Disp: , Rfl:    cetirizine (ZYRTEC ALLERGY) 10 MG tablet, Take 1 tablet (10 mg total) by mouth daily., Disp: 90 tablet, Rfl: 1   Allergies  Allergen Reactions   Latex      Review of Systems  Constitutional: Negative.   HENT: Negative.    Eyes:  Negative for blurred vision.  Respiratory: Negative.    Cardiovascular: Negative.  Negative for chest pain.  Gastrointestinal: Negative.   Musculoskeletal:  Positive for back pain.  Neurological: Negative.   Psychiatric/Behavioral: Negative.       Today's Vitals   12/27/22 1507  BP: 128/80  Pulse: 99  Temp: 98.2 F (36.8 C)  SpO2: 98%  Weight: 171 lb 12.8 oz (77.9 kg)  Height: 5\' 4"  (1.626 m)   Body mass index is 29.49 kg/m.  Wt Readings from Last 3 Encounters:  12/27/22 171 lb 12.8 oz (77.9 kg)  08/23/22 166 lb 12.8 oz (75.7 kg)  04/21/22 161 lb (73 kg)    Objective:  Physical Exam Vitals and nursing note reviewed.  Constitutional:      Appearance: Normal appearance.  HENT:     Head: Normocephalic and atraumatic.     Nose:     Comments: Masked     Mouth/Throat:     Comments: Masked  Eyes:     Extraocular  Movements: Extraocular movements intact.  Cardiovascular:     Rate and Rhythm: Normal rate and regular rhythm.     Heart sounds: Normal heart sounds.  Pulmonary:     Effort: Pulmonary effort is normal.     Breath sounds: Normal breath sounds.  Musculoskeletal:        General: Tenderness present.     Cervical back: Normal range of motion.     Comments: Antalgic gait  Skin:    General: Skin is warm.  Neurological:     General: No focal deficit present.  Mental Status: She is alert.  Psychiatric:        Mood and Affect: Mood normal.        Behavior: Behavior normal.         Assessment And Plan:     1. Type 2 diabetes mellitus with stage 3a chronic kidney disease, with long-term current use of insulin (HCC) Comments: Chronic, home BS readings appear to be controlled. I will check labs as below. Regarding CKD, reminded to avoid NSAIDs. - CMP14+EGFR - Hemoglobin A1c  2. 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 She will c/w Janumet XR 100/1000 once daily and Toujeo 16 units nightly.  3. Hypertensive nephropathy Comments: Chronic, well controlled. She will c/w lisinopril 10mg  daily.  4. 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 She will c/w pain mgmt as per Ortho.  5. BMI 29.0-29.9,adult Comments: She is encouraged to aim for at least 150 minutes of exercise/week.   Patient was given opportunity to ask questions. Patient verbalized understanding of the plan and was able to repeat key elements of the plan. All questions were answered to their satisfaction.   I, [name], MD, have reviewed all documentation for this visit. The documentation on 12/27/22 for the exam, diagnosis, procedures, and orders are all accurate and complete.   IF YOU HAVE BEEN REFERRED TO A SPECIALIST, IT MAY TAKE 1-2  WEEKS TO SCHEDULE/PROCESS THE REFERRAL. IF YOU HAVE NOT HEARD FROM US/SPECIALIST IN TWO WEEKS, PLEASE GIVE [place] A CALL AT [phone].   THE PATIENT IS ENCOURAGED TO PRACTICE SOCIAL DISTANCING DUE TO THE COVID-19 PANDEMIC.

## 2022-12-27 NOTE — Patient Instructions (Signed)

## 2022-12-28 LAB — CMP14+EGFR
ALT: 27 IU/L (ref 0–32)
AST: 25 IU/L (ref 0–40)
Albumin/Globulin Ratio: 1.7 (ref 1.2–2.2)
Albumin: 4.3 g/dL (ref 3.9–4.9)
Alkaline Phosphatase: 61 IU/L (ref 44–121)
BUN/Creatinine Ratio: 15 (ref 12–28)
BUN: 17 mg/dL (ref 8–27)
Bilirubin Total: <0.2 mg/dL (ref 0.0–1.2)
CO2: 23 mmol/L (ref 20–29)
Calcium: 9.2 mg/dL (ref 8.7–10.3)
Chloride: 101 mmol/L (ref 96–106)
Creatinine, Ser: 1.1 mg/dL — ABNORMAL HIGH (ref 0.57–1.00)
Globulin, Total: 2.5 g/dL (ref 1.5–4.5)
Glucose: 210 mg/dL — ABNORMAL HIGH (ref 70–99)
Potassium: 4.2 mmol/L (ref 3.5–5.2)
Sodium: 138 mmol/L (ref 134–144)
Total Protein: 6.8 g/dL (ref 6.0–8.5)
eGFR: 54 mL/min/{1.73_m2} — ABNORMAL LOW (ref >59)

## 2022-12-28 LAB — HEMOGLOBIN A1C
Est. average glucose Bld gHb Est-mCnc: 220 mg/dL
Hgb A1c MFr Bld: 9.3 % — ABNORMAL HIGH (ref 4.8–5.6)

## 2022-12-28 LAB — HM DIABETES EYE EXAM

## 2023-01-30 ENCOUNTER — Other Ambulatory Visit: Payer: Self-pay | Admitting: Internal Medicine

## 2023-02-01 ENCOUNTER — Other Ambulatory Visit: Payer: Self-pay | Admitting: Internal Medicine

## 2023-02-04 ENCOUNTER — Telehealth: Payer: Self-pay

## 2023-02-04 NOTE — Telephone Encounter (Signed)
Patient's PA for troujeo has been submitted through covermymeds and we are waiting for the determination. YL,RMA

## 2023-02-09 ENCOUNTER — Other Ambulatory Visit: Payer: Self-pay | Admitting: Internal Medicine

## 2023-03-01 ENCOUNTER — Encounter: Payer: Self-pay | Admitting: Internal Medicine

## 2023-03-02 ENCOUNTER — Ambulatory Visit: Payer: 59 | Admitting: Family Medicine

## 2023-03-02 DIAGNOSIS — Z6828 Body mass index (BMI) 28.0-28.9, adult: Secondary | ICD-10-CM

## 2023-03-02 DIAGNOSIS — B351 Tinea unguium: Secondary | ICD-10-CM

## 2023-03-02 MED ORDER — ITRACONAZOLE 100 MG PO CAPS: 200.0000 mg | ORAL_CAPSULE | Freq: Two times a day (BID) | ORAL | 0 refills | Status: AC

## 2023-03-02 NOTE — Progress Notes (Unsigned)
I,Jameka J Llittleton,acting as a Neurosurgeon for [employer], NP.,have documented all relevant documentation on the behalf of Niana Martorana, NP,as directed by  Kaleeyah Cuffie Moshe Salisbury, NP while in the presence of Rabon Scholle, NP.    Subjective:     Patient ID: Heather Tanner , female    DOB: [birth year] , 67 y.o.   MRN: [id number]   Chief Complaint  Patient presents with   Nail Problem    HPI  Patient presents today for a nail fungus. Patient states that she noticed fungus on her left ring finger some moths ago but now it has spread to the pinky also.She reports that some years ago, she had such fungi infection and  she thought it was because she got her nails done at the nail salon but she has since stopped and was surprised to have a fungi infection.     Past Medical History:  Diagnosis Date   Arthritis    Carpal tunnel syndrome 01/01/2016   right   Diabetes mellitus without complication (HCC)    High cholesterol    Hypertension      Family History  Problem Relation Age of Onset   Diabetes Mother    Heart Problems Mother    Hypertension Mother    Diabetes Father    Hypertension Father    Heart Problems Father    Hypertension Sister    Stroke Sister    Hyperlipidemia Sister    Cancer Sister        Adenocarcinoma   Heart Problems Brother    Healthy Son    Healthy Daughter    Healthy Daughter      Current Outpatient Medications:    Albuterol Sulfate (PROAIR RESPICLICK) 108 (90 Base) MCG/ACT AEPB, 2 puffs q6h prn, Disp: 1 each, Rfl: 3   atorvastatin (LIPITOR) 40 MG tablet, TAKE 1 TABLET BY MOUTH ON MONDAY, WEDNESDAY, AND FRIDAY, Disp: 30 tablet, Rfl: 2   BABY ASPIRIN PO, Take by mouth daily., Disp: , Rfl:    Blood Glucose Monitoring Suppl (ONE TOUCH ULTRA MINI) w/Device KIT, Use as instructed to check blood sugars 3 times per day 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 2 per day, Disp: , Rfl:    cetirizine (ZYRTEC ALLERGY) 10  MG tablet, Take 1 tablet (10 mg total) by mouth daily., Disp: 90 tablet, Rfl: 1   co-enzyme Q-10 30 MG capsule, Take 30 mg by mouth 3 (three) times daily., Disp: , Rfl:    Continuous Blood Gluc Sensor (DEXCOM G7 SENSOR) MISC, USE AS DIRECTED, Disp: 3 each, Rfl: 2   DULoxetine (CYMBALTA) 60 MG capsule, TAKE ONE CAPSULE BY MOUTH DAILY, Disp: 30 capsule, Rfl: 11   famotidine (PEPCID) 20 MG tablet, TAKE 1 TABLET BY MOUTH 2 TIMES A DAY, Disp: 60 tablet, Rfl: 1   HYDROcodone-acetaminophen (NORCO) 10-325 MG tablet, Take 1 tablet by mouth 3 (three) times daily as needed., Disp: , Rfl:    Insulin Pen Needle (PEN NEEDLES) 31G X 5 MM MISC, Inject 1 each into the skin daily., Disp: 100 each, Rfl: 2   itraconazole (SPORANOX) 100 MG capsule, Take 2 capsules (200 mg total) by mouth 2 (two) times daily for 7 days., Disp: 30 capsule, Rfl: 0   JANUMET XR [phone] MG TB24, TAKE 1 TABLET BY MOUTH EVERY EVENING, Disp: 30 tablet, Rfl: 5   Lancets (ONETOUCH DELICA PLUS LANCET33G) MISC, USE TO CHECK BLOOD SUGAR THREE TIMES A DAY, Disp:  100 each, Rfl: 3   lisinopril (ZESTRIL) 10 MG tablet, TAKE 1 TABLET BY MOUTH DAILY, Disp: 30 tablet, Rfl: 1   Misc Natural Products (MAGIC MUSHROOM MIX) CAPS, Take by mouth., Disp: , Rfl:    Multiple Vitamin (MULTIVITAMIN) tablet, Take 1 tablet by mouth daily., Disp: , Rfl:    ONETOUCH ULTRA test strip, USE AS INSTRUCTED TO CHECK BLOOD SUGAR THREE TIMES A DAY, Disp: 100 strip, Rfl: 1   OVER THE COUNTER MEDICATION, Total beet chew, Disp: , Rfl:    pravastatin (PRAVACHOL) 40 MG tablet, TAKE ONE TABLET BY MOUTH DAILY ON MONDAY, WEDNESDAY, AND FRIDAY, Disp: 90 tablet, Rfl: 1   pregabalin (LYRICA) 100 MG capsule, TAKE 1 CAPSULE(S) BY MOUTH THREE TIMES A DAY, Disp: 270 capsule, Rfl: 2   temazepam (RESTORIL) 15 MG capsule, temazepam 15 mg capsule, Disp: , Rfl:    TOUJEO SOLOSTAR 300 UNIT/ML Solostar Pen, INJECT 16 UNITS UNDER THE SKIN NIGHTLY AT BEDTIME, Disp: 1.5 mL, Rfl: 1   Turmeric-Ginger  150-25 MG CHEW, Chew by mouth., Disp: , Rfl:    Allergies  Allergen Reactions   Latex      Review of Systems  Constitutional: Negative.   Eyes: Negative.   Gastrointestinal: Negative.   Musculoskeletal: Negative.   Skin:  Positive for color change.       Left Index  and pinky fingernails brittle   Psychiatric/Behavioral: Negative.       Today's Vitals   03/02/23 1500  Temp: 98.1 F (36.7 C)  Weight: 167 lb (75.8 kg)  Height: 5\' 4"  (1.626 m)  PainSc: 0-No pain    Objective:  Physical Exam Skin:    Comments: Some finger nails on left hand are colored, thickened and brittle  Neurological:     General: No focal deficit present.     Mental Status: She is alert and oriented to person, place, and time. Mental status is at baseline.  Psychiatric:        Mood and Affect: Mood normal.         Assessment And Plan:     1. Onychomycosis of nail of digit of hand Comments: Start Sporanox 200mg  BID x 7 days  2. BMI 28.0-28.9,adult    Return if symptoms worsen or fail to improve, for keep previous scheduled appointment.  Patient was given opportunity to ask questions. Patient verbalized understanding of the plan and was able to repeat key elements of the plan. All questions were answered to their satisfaction.  Alora Gorey Moshe Salisbury, NP   I, Lex Linhares Moshe Salisbury, NP, have reviewed all documentation for this visit. The documentation on 03/05/23 for the exam, diagnosis, procedures, and orders are all accurate and complete.   IF YOU HAVE BEEN REFERRED TO A SPECIALIST, IT MAY TAKE 1-2 WEEKS TO SCHEDULE/PROCESS THE REFERRAL. IF YOU HAVE NOT HEARD FROM US/SPECIALIST IN TWO WEEKS, PLEASE GIVE [place] A CALL AT [phone].   THE PATIENT IS ENCOURAGED TO PRACTICE SOCIAL DISTANCING DUE TO THE COVID-19 PANDEMIC.

## 2023-03-05 DIAGNOSIS — Z6828 Body mass index (BMI) 28.0-28.9, adult: Secondary | ICD-10-CM | POA: Insufficient documentation

## 2023-03-05 DIAGNOSIS — B351 Tinea unguium: Secondary | ICD-10-CM | POA: Insufficient documentation

## 2023-03-08 ENCOUNTER — Other Ambulatory Visit: Payer: Self-pay | Admitting: Internal Medicine

## 2023-03-09 ENCOUNTER — Encounter: Payer: Self-pay | Admitting: Internal Medicine

## 2023-03-29 ENCOUNTER — Other Ambulatory Visit: Payer: Self-pay | Admitting: Internal Medicine

## 2023-04-08 ENCOUNTER — Other Ambulatory Visit: Payer: Self-pay | Admitting: Internal Medicine

## 2023-04-22 ENCOUNTER — Encounter: Payer: Self-pay | Admitting: Internal Medicine

## 2023-04-22 ENCOUNTER — Other Ambulatory Visit: Payer: Self-pay | Admitting: Internal Medicine

## 2023-04-22 ENCOUNTER — Ambulatory Visit (INDEPENDENT_AMBULATORY_CARE_PROVIDER_SITE_OTHER): Payer: 59 | Admitting: Internal Medicine

## 2023-04-22 VITALS — BP 110/78 | HR 88 | Temp 98.1°F | Ht 64.0 in | Wt 165.8 lb

## 2023-04-22 DIAGNOSIS — Z794 Long term (current) use of insulin: Secondary | ICD-10-CM

## 2023-04-22 DIAGNOSIS — B351 Tinea unguium: Secondary | ICD-10-CM

## 2023-04-22 DIAGNOSIS — N1831 Chronic kidney disease, stage 3a: Secondary | ICD-10-CM

## 2023-04-22 DIAGNOSIS — Z0000 Encounter for general adult medical examination without abnormal findings: Secondary | ICD-10-CM

## 2023-04-22 DIAGNOSIS — E1122 Type 2 diabetes mellitus with diabetic chronic kidney disease: Secondary | ICD-10-CM | POA: Diagnosis not present

## 2023-04-22 DIAGNOSIS — I129 Hypertensive chronic kidney disease with stage 1 through stage 4 chronic kidney disease, or unspecified chronic kidney disease: Secondary | ICD-10-CM

## 2023-04-22 LAB — POCT URINALYSIS DIPSTICK
Bilirubin, UA: NEGATIVE
Blood, UA: NEGATIVE
Glucose, UA: NEGATIVE
Ketones, UA: NEGATIVE
Leukocytes, UA: NEGATIVE
Nitrite, UA: NEGATIVE
Protein, UA: NEGATIVE
Spec Grav, UA: 1.025 (ref 1.010–1.025)
Urobilinogen, UA: 0.2 E.U./dL
pH, UA: 5.5 (ref 5.0–8.0)

## 2023-04-22 MED ORDER — DEXCOM G7 SENSOR MISC
11 refills | Status: DC
Start: 2023-04-22 — End: 2024-05-21

## 2023-04-22 MED ORDER — TOUJEO SOLOSTAR 300 UNIT/ML ~~LOC~~ SOPN
PEN_INJECTOR | SUBCUTANEOUS | 3 refills | Status: DC
Start: 2023-04-22 — End: 2023-05-30

## 2023-04-22 NOTE — Patient Instructions (Signed)

## 2023-04-22 NOTE — Progress Notes (Signed)
I,[name], CMA,acting as a Neurosurgeon for [name], MD.,have documented all relevant documentation on the behalf of [name], MD,as directed by  [name], MD while in the presence of [name], MD.  Subjective:  Patient ID: Heather Tanner , female    DOB: [birth year] , 67 y.o.   MRN: [id number]  Chief Complaint  Patient presents with  . Annual Exam  . Diabetes  . Hypertension    HPI  She is here today for a full physical examination. She is no longer followed by GYN. Her last pap smear was in 2020. She reports compliance with meds. She denies headaches, chest pain and shortness of breath.   Letter sent to ophthalmologist for DM eye exam.   Diabetes She presents for her follow-up diabetic visit. She has type 2 diabetes mellitus. There are no hypoglycemic associated symptoms. Pertinent negatives for diabetes include no blurred vision and no chest pain. There are no hypoglycemic complications. Diabetic complications include peripheral neuropathy and retinopathy. Risk factors for coronary artery disease include diabetes mellitus, dyslipidemia, hypertension, post-menopausal and sedentary lifestyle. She participates in exercise intermittently. An ACE inhibitor/angiotensin II receptor blocker is being taken.  Hypertension This is a chronic problem. The current episode started more than 1 year ago. The problem has been gradually improving since onset. The problem is controlled. Pertinent negatives include no blurred vision or chest pain. Past treatments include ACE inhibitors. Compliance problems include exercise.  Hypertensive end-organ damage includes retinopathy.     Past Medical History:  Diagnosis Date  . Arthritis   . Carpal tunnel syndrome 01/01/2016   right  . Diabetes mellitus without complication (HCC)   . High cholesterol   . Hypertension      Family History  Problem Relation Age of Onset  . Diabetes Mother   . Heart Problems Mother   .  Hypertension Mother   . Diabetes Father   . Hypertension Father   . Heart Problems Father   . Hypertension Sister   . Stroke Sister   . Hyperlipidemia Sister   . Cancer Sister        Adenocarcinoma  . Heart Problems Brother   . Healthy Son   . Healthy Daughter   . Healthy Daughter      Current Outpatient Medications:  .  Albuterol Sulfate (PROAIR RESPICLICK) 108 (90 Base) MCG/ACT AEPB, 2 puffs q6h prn, Disp: 1 each, Rfl: 3 .  atorvastatin (LIPITOR) 40 MG tablet, TAKE 1 TABLET BY MOUTH ON MONDAY, WEDNESDAY, AND FRIDAY, Disp: 30 tablet, Rfl: 2 .  BABY ASPIRIN PO, Take by mouth daily., Disp: , Rfl:  .  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  Calcium-Vitamin D-Vitamin K (VIACTIV PO), Take by mouth. 2 per day, Disp: , Rfl:  .  cetirizine (ZYRTEC ALLERGY) 10 MG tablet, Take 1 tablet (10 mg total) by mouth daily., Disp: 90 tablet, Rfl: 1 .  co-enzyme Q-10 30 MG capsule, Take 30 mg by mouth 3 (three) times daily., Disp: , Rfl:  .  Continuous Glucose Sensor (DEXCOM G7 SENSOR) MISC, Use as directed, Disp: 3 each, Rfl: 11 .  DULoxetine (CYMBALTA) 60 MG capsule, TAKE ONE CAPSULE BY MOUTH DAILY, Disp: 30 capsule, Rfl: 11 .  famotidine (PEPCID) 20 MG tablet, TAKE 1 TABLET BY MOUTH 2 TIMES A DAY, Disp: 60 tablet, Rfl: 1 .  HYDROcodone-acetaminophen (NORCO) 10-325 MG tablet, Take 1 tablet by mouth 3 (three) times daily as needed., Disp: , Rfl:  .  insulin glargine, 1 Unit Dial, (TOUJEO SOLOSTAR) 300 UNIT/ML Solostar Pen, Inject 16 units sq nightly, max titration dose 60 units, Disp: 9 mL, Rfl: 3 .  Insulin Pen Needle (PEN NEEDLES) 31G X 5 MM MISC, Inject 1 each into the skin daily., Disp: 100 each, Rfl: 2 .  JANUMET XR [phone] MG TB24, TAKE 1 TABLET BY MOUTH EVERY EVENING, Disp: 30 tablet, Rfl: 5 .  Lancets (ONETOUCH DELICA PLUS LANCET33G) MISC, USE TO CHECK BLOOD SUGAR THREE TIMES A  DAY, Disp: 100 each, Rfl: 3 .  lisinopril (ZESTRIL) 10 MG tablet, TAKE 1 TABLET BY MOUTH DAILY, Disp: 30 tablet, Rfl: 1 .  Misc Natural Products (MAGIC MUSHROOM MIX) CAPS, Take by mouth., Disp: , Rfl:  .  Multiple Vitamin (MULTIVITAMIN) tablet, Take 1 tablet by mouth daily., Disp: , Rfl:  .  ONETOUCH ULTRA test strip, USE AS INSTRUCTED TO CHECK BLOOD SUGAR THREE TIMES A DAY, Disp: 100 strip, Rfl: 1 .  OVER THE COUNTER MEDICATION, Total beet chew, Disp: , Rfl:  .  pravastatin (PRAVACHOL) 40 MG tablet, TAKE ONE TABLET BY MOUTH DAILY ON MONDAY, WEDNESDAY, AND FRIDAY, Disp: 90 tablet, Rfl: 1 .  pregabalin (LYRICA) 100 MG capsule, TAKE 1 CAPSULE(S) BY MOUTH THREE TIMES A DAY, Disp: 270 capsule, Rfl: 2 .  temazepam (RESTORIL) 15 MG capsule, temazepam 15 mg capsule, Disp: , Rfl:  .  Turmeric-Ginger 150-25 MG CHEW, Chew by mouth., Disp: , Rfl:    Allergies  Allergen Reactions  . Latex      Review of Systems  Constitutional: Negative.   HENT: Negative.    Eyes: Negative.  Negative for blurred vision.  Respiratory: Negative.    Cardiovascular: Negative.  Negative for chest pain.  Gastrointestinal: Negative.   Endocrine: Negative.   Genitourinary: Negative.   Musculoskeletal: Negative.   Skin: Negative.   Allergic/Immunologic: Negative.   Neurological: Negative.   Hematological: Negative.   Psychiatric/Behavioral: Negative.       Today's Vitals   04/22/23 1352  BP: 110/78  Pulse: 88  Temp: 98.1 F (36.7 C)  SpO2: 98%  Weight: 165 lb 12.8 oz (75.2 kg)  Height: 5\' 4"  (1.626 m)   Body mass index is 28.46 kg/m.  Wt Readings from Last 3 Encounters:  04/22/23 165 lb 12.8 oz (75.2 kg)  03/02/23 167 lb (75.8 kg)  12/27/22 171 lb 12.8 oz (77.9 kg)     Objective:  Physical Exam Vitals and nursing note reviewed.  Constitutional:      Appearance: Normal appearance.  HENT:     Head: Normocephalic and atraumatic.     Right Ear: Tympanic membrane, ear canal and external ear normal.      Left Ear: Tympanic membrane, ear canal and external ear normal.     Nose: Nose normal.     Mouth/Throat:     Mouth: Mucous membranes are moist.     Pharynx: Oropharynx is clear.  Eyes:     Extraocular Movements: Extraocular movements intact.     Conjunctiva/sclera: Conjunctivae normal.     Pupils: Pupils are equal, round, and reactive to light.  Cardiovascular:     Rate and Rhythm: Normal rate and regular rhythm.     Pulses: Normal pulses.          Dorsalis pedis pulses are 2+ on the right side and 2+ on the left side.  Heart sounds: Normal heart sounds.  Pulmonary:     Effort: Pulmonary effort is normal.     Breath sounds: Normal breath sounds.  Chest:  Breasts:    Tanner Score is 5.     Right: Normal.     Left: Normal.  Abdominal:     General: Abdomen is flat. Bowel sounds are normal.     Palpations: Abdomen is soft.  Genitourinary:    Comments: deferred Musculoskeletal:        General: Normal range of motion.     Cervical back: Normal range of motion and neck supple.  Feet:     Right foot:     Protective Sensation: 5 sites tested.  5 sites sensed.     Skin integrity: Dry skin present.     Toenail Condition: Right toenails are normal.     Left foot:     Protective Sensation: 5 sites tested.  5 sites sensed.     Skin integrity: Dry skin present.     Toenail Condition: Left toenails are normal.  Skin:    General: Skin is warm and dry.  Neurological:     General: No focal deficit present.     Mental Status: She is alert and oriented to person, place, and time.  Psychiatric:        Mood and Affect: Mood normal.        Behavior: Behavior normal.        Assessment And Plan:  Routine general medical examination at health care facility -     CBC -     CMP14+EGFR -     Lipid panel -     Hemoglobin A1c -     TSH  Type 2 diabetes mellitus with stage 3a chronic kidney disease, with long-term current use of insulin (HCC) -     POCT urinalysis dipstick -      Microalbumin / creatinine urine ratio -     Dexcom G7 Sensor; Use as directed  Dispense: 3 each; Refill: 11 -     Toujeo SoloStar; Inject 16 units sq nightly, max titration dose 60 units  Dispense: 9 mL; Refill: 3  Hypertensive nephropathy -     POCT urinalysis dipstick -     Microalbumin / creatinine urine ratio  Onychomycosis of nail of digit of hand     Return for 1 year physical, 3 month dm check ( put in apt note complete EKG) .  Patient was given opportunity to ask questions. Patient verbalized understanding of the plan and was able to repeat key elements of the plan. All questions were answered to their satisfaction.   I, [name], MD, have reviewed all documentation for this visit. The documentation on 05/01/23 for the exam, diagnosis, procedures, and orders are all accurate and complete.   IF YOU HAVE BEEN REFERRED TO A SPECIALIST, IT MAY TAKE 1-2 WEEKS TO SCHEDULE/PROCESS THE REFERRAL. IF YOU HAVE NOT HEARD FROM US/SPECIALIST IN TWO WEEKS, PLEASE GIVE [place] A CALL AT [phone].   THE PATIENT IS ENCOURAGED TO PRACTICE SOCIAL DISTANCING DUE TO THE COVID-19 PANDEMIC.

## 2023-04-23 LAB — CBC
Hematocrit: 27.8 % — ABNORMAL LOW (ref 34.0–46.6)
Hemoglobin: 9 g/dL — ABNORMAL LOW (ref 11.1–15.9)
MCH: 24.3 pg — ABNORMAL LOW (ref 26.6–33.0)
MCHC: 32.4 g/dL (ref 31.5–35.7)
MCV: 75 fL — ABNORMAL LOW (ref 79–97)
Platelets: 270 10*3/uL (ref 150–450)
RBC: 3.71 x10E6/uL — ABNORMAL LOW (ref 3.77–5.28)
RDW: 15.2 % (ref 11.7–15.4)
WBC: 6.5 10*3/uL (ref 3.4–10.8)

## 2023-04-23 LAB — CMP14+EGFR
ALT: 26 IU/L (ref 0–32)
AST: 28 IU/L (ref 0–40)
Albumin: 4.4 g/dL (ref 3.9–4.9)
Alkaline Phosphatase: 69 IU/L (ref 44–121)
BUN/Creatinine Ratio: 12 (ref 12–28)
BUN: 14 mg/dL (ref 8–27)
Bilirubin Total: <0.2 mg/dL (ref 0.0–1.2)
CO2: 23 mmol/L (ref 20–29)
Calcium: 9.8 mg/dL (ref 8.7–10.3)
Chloride: 101 mmol/L (ref 96–106)
Creatinine, Ser: 1.16 mg/dL — ABNORMAL HIGH (ref 0.57–1.00)
Globulin, Total: 2.7 g/dL (ref 1.5–4.5)
Glucose: 124 mg/dL — ABNORMAL HIGH (ref 70–99)
Potassium: 5 mmol/L (ref 3.5–5.2)
Sodium: 139 mmol/L (ref 134–144)
Total Protein: 7.1 g/dL (ref 6.0–8.5)
eGFR: 51 mL/min/{1.73_m2} — ABNORMAL LOW (ref >59)

## 2023-04-23 LAB — LIPID PANEL
Chol/HDL Ratio: 3.5 ratio (ref 0.0–4.4)
Cholesterol, Total: 155 mg/dL (ref 100–199)
HDL: 44 mg/dL (ref >39)
LDL Chol Calc (NIH): 94 mg/dL (ref 0–99)
Triglycerides: 88 mg/dL (ref 0–149)
VLDL Cholesterol Cal: 17 mg/dL (ref 5–40)

## 2023-04-23 LAB — HEMOGLOBIN A1C
Est. average glucose Bld gHb Est-mCnc: 232 mg/dL
Hgb A1c MFr Bld: 9.7 % — ABNORMAL HIGH (ref 4.8–5.6)

## 2023-04-23 LAB — MICROALBUMIN / CREATININE URINE RATIO
Creatinine, Urine: 55.5 mg/dL
Microalb/Creat Ratio: 51 mg/g creat — ABNORMAL HIGH (ref 0–29)
Microalbumin, Urine: 28.5 ug/mL

## 2023-04-23 LAB — TSH: TSH: 1.68 u[IU]/mL (ref 0.450–4.500)

## 2023-04-28 ENCOUNTER — Encounter: Payer: 59 | Admitting: Internal Medicine

## 2023-05-01 ENCOUNTER — Encounter: Payer: Self-pay | Admitting: Internal Medicine

## 2023-05-01 DIAGNOSIS — Z0000 Encounter for general adult medical examination without abnormal findings: Secondary | ICD-10-CM | POA: Insufficient documentation

## 2023-05-02 NOTE — Assessment & Plan Note (Signed)

## 2023-05-02 NOTE — Assessment & Plan Note (Signed)
Chronic, I will check labs as below. Diabetic foot exam was performed. She will c/w Toujeo 16 units nightly, Janumet XR 100/1000mg  daily. Importance of dietary/medication compliance was discussed with the patient. Advised to send in fasting Bs every Monday so her insulin can be titrated to help her achieve improved BS readings.  I DISCUSSED WITH THE PATIENT AT LENGTH REGARDING THE GOALS OF GLYCEMIC CONTROL AND POSSIBLE LONG-TERM COMPLICATIONS.  I  ALSO STRESSED THE IMPORTANCE OF COMPLIANCE WITH HOME GLUCOSE MONITORING, DIETARY RESTRICTIONS INCLUDING AVOIDANCE OF SUGARY DRINKS/PROCESSED FOODS,  ALONG WITH REGULAR EXERCISE.  I  ALSO STRESSED THE IMPORTANCE OF ANNUAL EYE EXAMS, SELF FOOT CARE AND COMPLIANCE WITH OFFICE VISITS.

## 2023-05-02 NOTE — Assessment & Plan Note (Addendum)
Chronic, well controlled.  She will c/w lisinopril 10mg  daily.  She is encouraged to follow low sodium diet.

## 2023-05-11 ENCOUNTER — Other Ambulatory Visit: Payer: Self-pay | Admitting: Internal Medicine

## 2023-05-26 NOTE — Patient Instructions (Signed)

## 2023-05-27 ENCOUNTER — Other Ambulatory Visit: Payer: Self-pay | Admitting: Internal Medicine

## 2023-05-27 DIAGNOSIS — R195 Other fecal abnormalities: Secondary | ICD-10-CM

## 2023-05-27 DIAGNOSIS — D649 Anemia, unspecified: Secondary | ICD-10-CM

## 2023-05-30 ENCOUNTER — Ambulatory Visit: Payer: 59 | Admitting: Internal Medicine

## 2023-05-30 ENCOUNTER — Encounter: Payer: Self-pay | Admitting: Internal Medicine

## 2023-05-30 VITALS — BP 118/70 | HR 99 | Temp 98.6°F | Ht 64.0 in | Wt 158.8 lb

## 2023-05-30 DIAGNOSIS — I129 Hypertensive chronic kidney disease with stage 1 through stage 4 chronic kidney disease, or unspecified chronic kidney disease: Secondary | ICD-10-CM | POA: Diagnosis not present

## 2023-05-30 DIAGNOSIS — Z79899 Other long term (current) drug therapy: Secondary | ICD-10-CM

## 2023-05-30 DIAGNOSIS — Z794 Long term (current) use of insulin: Secondary | ICD-10-CM

## 2023-05-30 DIAGNOSIS — E113552 Type 2 diabetes mellitus with stable proliferative diabetic retinopathy, left eye: Secondary | ICD-10-CM

## 2023-05-30 DIAGNOSIS — N1831 Chronic kidney disease, stage 3a: Secondary | ICD-10-CM

## 2023-05-30 DIAGNOSIS — R195 Other fecal abnormalities: Secondary | ICD-10-CM

## 2023-05-30 DIAGNOSIS — D649 Anemia, unspecified: Secondary | ICD-10-CM | POA: Diagnosis not present

## 2023-05-30 DIAGNOSIS — E1122 Type 2 diabetes mellitus with diabetic chronic kidney disease: Secondary | ICD-10-CM | POA: Diagnosis not present

## 2023-05-30 DIAGNOSIS — R0602 Shortness of breath: Secondary | ICD-10-CM

## 2023-05-30 LAB — POC HEMOCCULT BLD/STL (OFFICE/1-CARD/DIAGNOSTIC): Fecal Occult Blood, POC: POSITIVE — AB

## 2023-05-30 MED ORDER — DAPAGLIFLOZIN PROPANEDIOL 10 MG PO TABS: 10.0000 mg | ORAL_TABLET | Freq: Every day | ORAL | 5 refills | Status: DC

## 2023-05-30 MED ORDER — TOUJEO SOLOSTAR 300 UNIT/ML ~~LOC~~ SOPN
PEN_INJECTOR | SUBCUTANEOUS | Status: DC
Start: 2023-05-30 — End: 2023-08-22

## 2023-05-30 NOTE — Assessment & Plan Note (Signed)
Chronic, she has tolerated [place] without any adverse effects. I will check a BMP today.

## 2023-05-30 NOTE — Progress Notes (Signed)
I,[name], CMA,acting as a Neurosurgeon for [name], MD.,have documented all relevant documentation on the behalf of [name], MD,as directed by  [name], MD while in the presence of [name], MD.  Subjective:  Patient ID: Heather Tanner , female    DOB: Oct 30, 1955 , 67 y.o.   MRN: [id number]  Chief Complaint  Patient presents with   Diabetes    HPI  Patient presents today for Farxiga follow up. She was started on Farxiga 10mg  daily after her last A1c came back greater than 9%.  She has not had any issues with the medication.   Diabetes She presents for her follow-up diabetic visit. She has type 2 diabetes mellitus. There are no hypoglycemic associated symptoms. Pertinent negatives for diabetes include no blurred vision and no chest pain. There are no hypoglycemic complications. Diabetic complications include peripheral neuropathy and retinopathy. Risk factors for coronary artery disease include diabetes mellitus, dyslipidemia, hypertension, post-menopausal and sedentary lifestyle. She participates in exercise intermittently. An ACE inhibitor/angiotensin II receptor blocker is being taken.  Hypertension This is a chronic problem. The current episode started more than 1 year ago. The problem has been gradually improving since onset. The problem is controlled. Associated symptoms include shortness of breath. Pertinent negatives include no blurred vision or chest pain. Past treatments include ACE inhibitors. Compliance problems include exercise.  Hypertensive end-organ damage includes retinopathy.     Past Medical History:  Diagnosis Date   Arthritis    Carpal tunnel syndrome 01/01/2016   right   Diabetes mellitus without complication (HCC)    High cholesterol    Hypertension      Family History  Problem Relation Age of Onset   Diabetes Mother    Heart Problems Mother    Hypertension Mother    Diabetes Father    Hypertension Father    Heart  Problems Father    Hypertension Sister    Stroke Sister    Hyperlipidemia Sister    Cancer Sister        Adenocarcinoma   Heart Problems Brother    Healthy Son    Healthy Daughter    Healthy Daughter      Current Outpatient Medications:    Albuterol Sulfate (PROAIR RESPICLICK) 108 (90 Base) MCG/ACT AEPB, 2 puffs q6h prn, Disp: 1 each, Rfl: 3   atorvastatin (LIPITOR) 40 MG tablet, TAKE 1 TABLET BY MOUTH ON MONDAY, WEDNESDAY, AND FRIDAY (Patient not taking: Reported on 06/07/2023), Disp: 30 tablet, Rfl: 2   Blood Glucose Monitoring Suppl (ONE TOUCH ULTRA MINI) w/Device KIT, Use as instructed to check blood sugars 3 times per day 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 2 per day, Disp: , Rfl:    cetirizine (ZYRTEC ALLERGY) 10 MG tablet, Take 1 tablet (10 mg total) by mouth daily., Disp: 90 tablet, Rfl: 1   co-enzyme Q-10 30 MG capsule, Take 30 mg by mouth 3 (three) times daily., Disp: , Rfl:    Continuous Glucose Sensor (DEXCOM G7 SENSOR) MISC, Use as directed, Disp: 3 each, Rfl: 11   dapagliflozin propanediol (FARXIGA) 10 MG TABS tablet, Take 1 tablet (10 mg total) by mouth daily before breakfast., Disp: 30 tablet, Rfl: 5   DULoxetine (CYMBALTA) 60 MG capsule, TAKE ONE CAPSULE BY MOUTH DAILY, Disp: 30 capsule, Rfl: 11   HYDROcodone-acetaminophen (NORCO) 10-325 MG tablet, Take 1 tablet by mouth 3 (three) times daily as needed. (Patient not  taking: Reported on 06/07/2023), Disp: , Rfl:    Insulin Pen Needle (PEN NEEDLES) 31G X 5 MM MISC, Inject 1 each into the skin daily., Disp: 100 each, Rfl: 2   JANUMET XR [phone] MG TB24, TAKE 1 TABLET BY MOUTH EVERY EVENING, Disp: 30 tablet, Rfl: 5   Lancets (ONETOUCH DELICA PLUS LANCET33G) MISC, USE TO CHECK BLOOD SUGAR THREE TIMES A DAY, Disp: 100 each, Rfl: 3   Misc Natural Products (MAGIC MUSHROOM MIX) CAPS, Take by mouth. (Patient not taking: Reported on 06/07/2023), Disp: , Rfl:    Multiple Vitamin  (MULTIVITAMIN) tablet, Take 1 tablet by mouth daily., Disp: , Rfl:    ONETOUCH ULTRA test strip, USE AS INSTRUCTED TO CHECK BLOOD SUGAR THREE TIMES A DAY, Disp: 100 strip, Rfl: 1   OVER THE COUNTER MEDICATION, Total beet chew, Disp: , Rfl:    Turmeric-Ginger 150-25 MG CHEW, Chew by mouth., Disp: , Rfl:    famotidine (PEPCID) 20 MG tablet, Take 20 mg by mouth 2 (two) times daily., Disp: , Rfl:    insulin glargine, 1 Unit Dial, (TOUJEO SOLOSTAR) 300 UNIT/ML Solostar Pen, Inject 20 units sq nightly, max titration dose 60 units, Disp: , Rfl:    Iron-FA-B Cmp-C-Biot-Probiotic (FUSION PLUS) CAPS, Take 1 capsule by mouth daily., Disp: , Rfl:    lisinopril (ZESTRIL) 10 MG tablet, Take 10 mg by mouth daily., Disp: , Rfl:    melatonin 10 MG TABS, Take 10 mg by mouth at bedtime as needed., Disp: 30 tablet, Rfl: 0   pantoprazole (PROTONIX) 40 MG tablet, Take 1 tablet (40 mg total) by mouth daily., Disp: 30 tablet, Rfl: 1   pregabalin (LYRICA) 100 MG capsule, Take 100 mg by mouth 3 (three) times daily., Disp: , Rfl:    Rimegepant Sulfate (NURTEC) 75 MG TBDP, One tab po every day prn, Disp: , Rfl:    Allergies  Allergen Reactions   Latex      Review of Systems  Constitutional: Negative.   Eyes:  Negative for blurred vision.  Respiratory:  Positive for shortness of breath.   Cardiovascular: Negative.  Negative for chest pain.  Gastrointestinal: Negative.   Psychiatric/Behavioral: Negative.       Today's Vitals   05/30/23 1452  BP: 118/70  Pulse: 99  Temp: 98.6 F (37 C)  SpO2: 98%  Weight: 158 lb 12.8 oz (72 kg)  Height: 5\' 4"  (1.626 m)  PainSc: 0-No pain   Body mass index is 27.26 kg/m.  Wt Readings from Last 3 Encounters:  06/07/23 158 lb 9.6 oz (71.9 kg)  05/31/23 158 lb (71.7 kg)  05/30/23 158 lb 12.8 oz (72 kg)     Objective:  Physical Exam Vitals and nursing note reviewed. Exam conducted with a chaperone present.  Constitutional:      Appearance: Normal appearance.  HENT:      Head: Normocephalic and atraumatic.  Eyes:     Extraocular Movements: Extraocular movements intact.  Cardiovascular:     Rate and Rhythm: Normal rate and regular rhythm.     Heart sounds: Normal heart sounds.  Pulmonary:     Effort: Pulmonary effort is normal.     Breath sounds: Normal breath sounds.  Genitourinary:    Rectum: Guaiac result positive. No mass or tenderness.  Musculoskeletal:     Cervical back: Normal range of motion.  Skin:    General: Skin is warm.  Neurological:     General: No focal deficit present.     Mental Status: She  is alert.  Psychiatric:        Mood and Affect: Mood normal.        Behavior: Behavior normal.         Assessment And Plan:  Type 2 diabetes mellitus with stage 3a chronic kidney disease, with long-term current use of insulin (HCC) Assessment & Plan: Chronic, she has tolerated [place] without any adverse effects. I will check a BMP today.   Orders: -     BMP8+EGFR -     Toujeo SoloStar; Inject 20 units sq nightly, max titration dose 60 units  Anemia, unspecified type Assessment & Plan: DRE performed, stool is guaiac positive, brown in color.  I will refer her to GI for further evaluation. She is in agreement with treatment plan.   Orders: -     Iron, TIBC and Ferritin Panel -     POC Hemoccult Bld/Stl (1-Cd Office Dx)  Hypertensive nephropathy Assessment & Plan: Chronic, well controlled. No med changes today.    Shortness of breath Assessment & Plan: Likely related to anemia. EKG performed, NSR w/o acute changes. I will check iron studies today as well.   Orders: -     EKG 12-Lead  Guaiac positive stools Assessment & Plan: Referral placed for GI evaluation.    Polypharmacy -     BMP8+EGFR  Other orders -     Dapagliflozin Propanediol; Take 1 tablet (10 mg total) by mouth daily before breakfast.  Dispense: 30 tablet; Refill: 5     Return if symptoms worsen or fail to improve.  Patient was given opportunity to  ask questions. Patient verbalized understanding of the plan and was able to repeat key elements of the plan. All questions were answered to their satisfaction.    I, [name], MD, have reviewed all documentation for this visit. The documentation on 05/30/23 for the exam, diagnosis, procedures, and orders are all accurate and complete.   IF YOU HAVE BEEN REFERRED TO A SPECIALIST, IT MAY TAKE 1-2 WEEKS TO SCHEDULE/PROCESS THE REFERRAL. IF YOU HAVE NOT HEARD FROM US/SPECIALIST IN TWO WEEKS, PLEASE GIVE [place] A CALL AT [phone].   THE PATIENT IS ENCOURAGED TO PRACTICE SOCIAL DISTANCING DUE TO THE COVID-19 PANDEMIC.

## 2023-05-31 ENCOUNTER — Ambulatory Visit: Payer: 59

## 2023-05-31 ENCOUNTER — Observation Stay (HOSPITAL_COMMUNITY)
Admission: EM | Admit: 2023-05-31 | Discharge: 2023-06-02 | Disposition: A | Discharge status: Home / Self Care | Payer: 59 | Attending: Internal Medicine | Admitting: Internal Medicine

## 2023-05-31 ENCOUNTER — Encounter (HOSPITAL_COMMUNITY): Payer: Self-pay | Admitting: Internal Medicine

## 2023-05-31 ENCOUNTER — Other Ambulatory Visit: Payer: Self-pay

## 2023-05-31 VITALS — Temp 98.1°F | Ht 64.0 in | Wt 158.0 lb

## 2023-05-31 DIAGNOSIS — E785 Hyperlipidemia, unspecified: Secondary | ICD-10-CM

## 2023-05-31 DIAGNOSIS — Z79899 Other long term (current) drug therapy: Secondary | ICD-10-CM | POA: Insufficient documentation

## 2023-05-31 DIAGNOSIS — Z791 Long term (current) use of non-steroidal anti-inflammatories (NSAID): Secondary | ICD-10-CM

## 2023-05-31 DIAGNOSIS — E1122 Type 2 diabetes mellitus with diabetic chronic kidney disease: Secondary | ICD-10-CM | POA: Diagnosis not present

## 2023-05-31 DIAGNOSIS — N1831 Chronic kidney disease, stage 3a: Secondary | ICD-10-CM | POA: Diagnosis not present

## 2023-05-31 DIAGNOSIS — Z91040 Latex allergy status: Secondary | ICD-10-CM | POA: Insufficient documentation

## 2023-05-31 DIAGNOSIS — N179 Acute kidney failure, unspecified: Secondary | ICD-10-CM

## 2023-05-31 DIAGNOSIS — R192 Visible peristalsis: Secondary | ICD-10-CM | POA: Diagnosis not present

## 2023-05-31 DIAGNOSIS — K922 Gastrointestinal hemorrhage, unspecified: Secondary | ICD-10-CM | POA: Diagnosis not present

## 2023-05-31 DIAGNOSIS — I129 Hypertensive chronic kidney disease with stage 1 through stage 4 chronic kidney disease, or unspecified chronic kidney disease: Secondary | ICD-10-CM | POA: Insufficient documentation

## 2023-05-31 DIAGNOSIS — G43E19 Chronic migraine with aura, intractable, without status migrainosus: Secondary | ICD-10-CM

## 2023-05-31 DIAGNOSIS — R1013 Epigastric pain: Secondary | ICD-10-CM | POA: Diagnosis present

## 2023-05-31 DIAGNOSIS — R519 Headache, unspecified: Secondary | ICD-10-CM

## 2023-05-31 DIAGNOSIS — K259 Gastric ulcer, unspecified as acute or chronic, without hemorrhage or perforation: Secondary | ICD-10-CM | POA: Diagnosis not present

## 2023-05-31 DIAGNOSIS — I10 Essential (primary) hypertension: Secondary | ICD-10-CM

## 2023-05-31 DIAGNOSIS — D500 Iron deficiency anemia secondary to blood loss (chronic): Secondary | ICD-10-CM | POA: Insufficient documentation

## 2023-05-31 DIAGNOSIS — Z794 Long term (current) use of insulin: Secondary | ICD-10-CM | POA: Insufficient documentation

## 2023-05-31 DIAGNOSIS — R42 Dizziness and giddiness: Secondary | ICD-10-CM

## 2023-05-31 DIAGNOSIS — G8929 Other chronic pain: Secondary | ICD-10-CM

## 2023-05-31 DIAGNOSIS — E860 Dehydration: Secondary | ICD-10-CM

## 2023-05-31 LAB — COMPREHENSIVE METABOLIC PANEL
ALT: 31 U/L (ref 0–44)
AST: 29 U/L (ref 15–41)
Albumin: 4.4 g/dL (ref 3.5–5.0)
Alkaline Phosphatase: 70 U/L (ref 38–126)
Anion gap: 14 (ref 5–15)
BUN: 32 mg/dL — ABNORMAL HIGH (ref 8–23)
CO2: 21 mmol/L — ABNORMAL LOW (ref 22–32)
Calcium: 10.3 mg/dL (ref 8.9–10.3)
Chloride: 98 mmol/L (ref 98–111)
Creatinine, Ser: 1.82 mg/dL — ABNORMAL HIGH (ref 0.44–1.00)
GFR, Estimated: 30 mL/min — ABNORMAL LOW (ref >60)
Glucose, Bld: 181 mg/dL — ABNORMAL HIGH (ref 70–99)
Potassium: 4.9 mmol/L (ref 3.5–5.1)
Sodium: 133 mmol/L — ABNORMAL LOW (ref 135–145)
Total Bilirubin: 0.2 mg/dL — ABNORMAL LOW (ref 0.3–1.2)
Total Protein: 8.6 g/dL — ABNORMAL HIGH (ref 6.5–8.1)

## 2023-05-31 LAB — IRON,TIBC AND FERRITIN PANEL
Ferritin: 13 ng/mL — ABNORMAL LOW (ref 15–150)
Iron Saturation: 7 % — CL (ref 15–55)
Iron: 33 ug/dL (ref 27–139)
Total Iron Binding Capacity: 469 ug/dL — ABNORMAL HIGH (ref 250–450)
UIBC: 436 ug/dL — ABNORMAL HIGH (ref 118–369)

## 2023-05-31 LAB — BMP8+EGFR
BUN/Creatinine Ratio: 19 (ref 12–28)
BUN: 30 mg/dL — ABNORMAL HIGH (ref 8–27)
CO2: 22 mmol/L (ref 20–29)
Calcium: 10.5 mg/dL — ABNORMAL HIGH (ref 8.7–10.3)
Chloride: 101 mmol/L (ref 96–106)
Creatinine, Ser: 1.54 mg/dL — ABNORMAL HIGH (ref 0.57–1.00)
Glucose: 242 mg/dL — ABNORMAL HIGH (ref 70–99)
Potassium: 6.4 mmol/L — ABNORMAL HIGH (ref 3.5–5.2)
Sodium: 139 mmol/L (ref 134–144)
eGFR: 36 mL/min/{1.73_m2} — ABNORMAL LOW (ref >59)

## 2023-05-31 LAB — CBC WITH DIFFERENTIAL/PLATELET
Abs Immature Granulocytes: 0.03 10*3/uL (ref 0.00–0.07)
Basophils Absolute: 0 10*3/uL (ref 0.0–0.1)
Basophils Relative: 0 %
Eosinophils Absolute: 0.1 10*3/uL (ref 0.0–0.5)
Eosinophils Relative: 1 %
HCT: 36.1 % (ref 36.0–46.0)
Hemoglobin: 11.1 g/dL — ABNORMAL LOW (ref 12.0–15.0)
Immature Granulocytes: 0 %
Lymphocytes Relative: 27 %
Lymphs Abs: 2.5 10*3/uL (ref 0.7–4.0)
MCH: 24.6 pg — ABNORMAL LOW (ref 26.0–34.0)
MCHC: 30.7 g/dL (ref 30.0–36.0)
MCV: 79.9 fL — ABNORMAL LOW (ref 80.0–100.0)
Monocytes Absolute: 0.7 10*3/uL (ref 0.1–1.0)
Monocytes Relative: 8 %
Neutro Abs: 5.8 10*3/uL (ref 1.7–7.7)
Neutrophils Relative %: 64 %
Platelets: 356 10*3/uL (ref 150–400)
RBC: 4.52 MIL/uL (ref 3.87–5.11)
RDW: 16.7 % — ABNORMAL HIGH (ref 11.5–15.5)
WBC: 9.2 10*3/uL (ref 4.0–10.5)
nRBC: 0 % (ref 0.0–0.2)

## 2023-05-31 LAB — PROTIME-INR
INR: 1 (ref 0.8–1.2)
Prothrombin Time: 13.7 seconds (ref 11.4–15.2)

## 2023-05-31 LAB — ABO/RH: ABO/RH(D): O POS

## 2023-05-31 LAB — TYPE AND SCREEN
ABO/RH(D): O POS
Antibody Screen: NEGATIVE

## 2023-05-31 LAB — GLUCOSE, CAPILLARY: Glucose-Capillary: 135 mg/dL — ABNORMAL HIGH (ref 70–99)

## 2023-05-31 LAB — SALICYLATE LEVEL: Salicylate Lvl: <7 mg/dL — ABNORMAL LOW (ref 7.0–30.0)

## 2023-05-31 MED ORDER — DULOXETINE HCL 60 MG PO CPEP
60.0000 mg | ORAL_CAPSULE | Freq: Every day | ORAL | Status: DC
  Administered 2023-05-31 – 2023-06-02 (×3): 60 mg via ORAL
  Filled 2023-05-31 (×3): qty 1

## 2023-05-31 MED ORDER — HYDROCODONE-ACETAMINOPHEN 5-325 MG PO TABS
1.0000 | ORAL_TABLET | ORAL | Status: DC | PRN
  Administered 2023-06-01: 1 via ORAL
  Filled 2023-05-31: qty 1

## 2023-05-31 MED ORDER — MELATONIN 5 MG PO TABS: 10.0000 mg | ORAL_TABLET | Freq: Every evening | ORAL | Status: DC | PRN

## 2023-05-31 MED ORDER — LACTATED RINGERS IV SOLN: INTRAVENOUS | Status: AC

## 2023-05-31 MED ORDER — ONDANSETRON HCL 4 MG PO TABS: 4.0000 mg | ORAL_TABLET | Freq: Four times a day (QID) | ORAL | Status: DC | PRN

## 2023-05-31 MED ORDER — ACETAMINOPHEN 650 MG RE SUPP: 650.0000 mg | Freq: Four times a day (QID) | RECTAL | Status: DC | PRN

## 2023-05-31 MED ORDER — PANTOPRAZOLE SODIUM 40 MG IV SOLR: 40.0000 mg | Freq: Two times a day (BID) | INTRAVENOUS | Status: DC

## 2023-05-31 MED ORDER — ONDANSETRON HCL 4 MG/2ML IJ SOLN: 4.0000 mg | Freq: Four times a day (QID) | INTRAMUSCULAR | Status: DC | PRN

## 2023-05-31 MED ORDER — INSULIN ASPART 100 UNIT/ML IJ SOLN
0.0000 [IU] | INTRAMUSCULAR | Status: DC
  Administered 2023-06-01: 2 [IU] via SUBCUTANEOUS
  Administered 2023-06-01: 3 [IU] via SUBCUTANEOUS
  Administered 2023-06-01: 2 [IU] via SUBCUTANEOUS
  Administered 2023-06-01: 1 [IU] via SUBCUTANEOUS
  Administered 2023-06-02 (×3): 2 [IU] via SUBCUTANEOUS

## 2023-05-31 MED ORDER — PANTOPRAZOLE INFUSION (NEW) - SIMPLE MED
8.0000 mg/h | INTRAVENOUS | Status: DC
  Administered 2023-05-31 – 2023-06-02 (×3): 8 mg/h via INTRAVENOUS
  Filled 2023-05-31 (×6): qty 100

## 2023-05-31 MED ORDER — PANTOPRAZOLE 80MG IVPB - SIMPLE MED
80.0000 mg | Freq: Once | INTRAVENOUS | Status: AC
  Administered 2023-05-31: 80 mg via INTRAVENOUS
  Filled 2023-05-31: qty 100

## 2023-05-31 MED ORDER — ACETAMINOPHEN 325 MG PO TABS: 650.0000 mg | ORAL_TABLET | Freq: Four times a day (QID) | ORAL | Status: DC | PRN

## 2023-05-31 MED ORDER — LACTATED RINGERS IV BOLUS
1000.0000 mL | Freq: Once | INTRAVENOUS | Status: AC
  Administered 2023-05-31: 1000 mL via INTRAVENOUS

## 2023-05-31 NOTE — H&P (Signed)
History and Physical    Heather Tanner GEX:[id number] DOB: [birth year] DOA: 05/31/2023  DOS: the patient was seen and examined on 05/31/2023  PCP: Dorothyann Peng, MD   Patient coming from: Home  I have personally briefly reviewed patient's old medical records in [place] [facility]  CC: heme + stool HPI: 67 year old African-American female history of type 2 diabetes on insulin, CKD stage IIIa baseline creatinine 1.1, hypertension, chronic headaches who presents to the ER from her PCP office.  Patient was looking pale at work.  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  She was seen by her PCP.  Noted to be orthostatic.  She had heme positive stools.  Patient sent to ER for evaluation.  Patient does note that she has chronic headaches and she has had these for several years.  She has been taking Goody powders 1 packet 3 times a day for the last several months.  She started having some abdominal pain today.  No fevers.  She is notably orthostatic in the office.  On arrival temp 90.3 heart rate 112 blood pressure 126/77 satting 99% on room air.  White count 9.2, hemoglobin 11.1, platelets of 356  Sodium 133, potassium 4.9, bicarb 21, BUN of 32, creatinine 1.8, glucose 181 Total protein 8.6, albumin 4.4, AST 29, ALT 31, alk phos of 70  EKG my interpretation shows normal sinus rhythm.  EDP is consulted Dr. Elnoria Howard with GI.  He plans on upper endoscopy tomorrow.  [facility] hospitalist consulted.   ED Course: orthostatic in the office. Heme + stool in office. Started on IV protonix gtts and IVF. GI consulted.  Review of Systems:  Review of Systems  Constitutional: Negative.   HENT: Negative.    Eyes: Negative.   Respiratory: Negative.    Cardiovascular: Negative.   Gastrointestinal:  Positive for abdominal pain and blood in stool.  Genitourinary: Negative.   Musculoskeletal: Negative.   Skin: Negative.   Neurological:  Positive for headaches.  Endo/Heme/Allergies:  Negative.   Psychiatric/Behavioral: Negative.    All other systems reviewed and are negative.   Past Medical History:  Diagnosis Date   Arthritis    Carpal tunnel syndrome 01/01/2016   right   Diabetes mellitus without complication (HCC)    High cholesterol    Hypertension     Past Surgical History:  Procedure Laterality Date   CHOLECYSTECTOMY     KNEE ARTHROSCOPY Right 1995   TONSILLECTOMY     TUBAL LIGATION  1986     reports that she has never smoked. She has never used smokeless tobacco. She reports current alcohol use. She reports that she does not use drugs.  Allergies  Allergen Reactions   Latex     Family History  Problem Relation Age of Onset   Diabetes Mother    Heart Problems Mother    Hypertension Mother    Diabetes Father    Hypertension Father    Heart Problems Father    Hypertension Sister    Stroke Sister    Hyperlipidemia Sister    Cancer Sister        Adenocarcinoma   Heart Problems Brother    Healthy Son    Healthy Daughter    Healthy Daughter     Prior to Admission medications   Medication Sig Start Date End Date Taking? Authorizing Provider  Albuterol Sulfate (PROAIR RESPICLICK) 108 (90 Base) MCG/ACT AEPB 2 puffs q6h prn 09/08/22   Dorothyann Peng, MD  atorvastatin (LIPITOR) 40 MG  tablet TAKE 1 TABLET BY MOUTH ON MONDAY, WEDNESDAY, AND FRIDAY 11/08/22   Dorothyann Peng, MD  BABY ASPIRIN PO Take by mouth daily.    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 2 per day    [provider]  cetirizine (ZYRTEC ALLERGY) 10 MG tablet Take 1 tablet (10 mg total) by mouth daily. 12/27/22 12/27/23  Dorothyann Peng, MD  co-enzyme Q-10 30 MG capsule Take 30 mg by mouth 3 (three) times daily.    [provider]  Continuous Glucose Sensor (DEXCOM  G7 SENSOR) MISC Use as directed 04/22/23   Dorothyann Peng, MD  dapagliflozin propanediol (FARXIGA) 10 MG TABS tablet Take 1 tablet (10 mg total) by mouth daily before breakfast. 05/30/23   Dorothyann Peng, MD  DULoxetine (CYMBALTA) 60 MG capsule TAKE ONE CAPSULE BY MOUTH DAILY 09/01/22   Levert Feinstein, MD  famotidine (PEPCID) 20 MG tablet TAKE 1 TABLET BY MOUTH 2 TIMES A DAY 04/08/23   Dorothyann Peng, MD  HYDROcodone-acetaminophen [facility]) 10-325 MG tablet Take 1 tablet by mouth 3 (three) times daily as needed. 07/30/21   [provider]  insulin glargine, 1 Unit Dial, (TOUJEO SOLOSTAR) 300 UNIT/ML Solostar Pen Inject 20 units sq nightly, max titration dose 60 units 05/30/23   Dorothyann Peng, MD  Insulin Pen Needle (PEN NEEDLES) 31G X 5 MM MISC Inject 1 each into the skin daily. 12/09/21   Dorothyann Peng, MD  JANUMET XR [phone] MG TB24 TAKE 1 TABLET BY MOUTH EVERY EVENING 05/11/23   Dorothyann Peng, MD  Lancets [facility] DELICA PLUS [place]) MISC USE TO CHECK BLOOD SUGAR THREE TIMES A DAY 12/27/21   Dorothyann Peng, MD  lisinopril (ZESTRIL) 10 MG tablet TAKE 1 TABLET BY MOUTH DAILY 03/30/23   Dorothyann Peng, MD  Misc Natural Products [facility] MUSHROOM MIX) CAPS Take by mouth.    [provider]  Multiple Vitamin (MULTIVITAMIN) tablet Take 1 tablet by mouth daily.    [provider]  [facility] ULTRA test strip USE AS INSTRUCTED TO CHECK BLOOD SUGAR THREE TIMES A DAY 04/20/21   Dorothyann Peng, MD  OVER THE COUNTER MEDICATION Total beet chew    [provider]  pravastatin (PRAVACHOL) 40 MG tablet TAKE ONE TABLET BY MOUTH DAILY ON MONDAY, WEDNESDAY, AND FRIDAY 06/10/21   Dorothyann Peng, MD  pregabalin (LYRICA) 100 MG capsule TAKE 1 CAPSULE(S) BY MOUTH THREE TIMES A DAY 07/08/22   Dorothyann Peng, MD  temazepam (RESTORIL) 15 MG capsule temazepam 15 mg capsule    [provider]  Turmeric-Ginger 150-25 MG CHEW Chew by mouth.    [provider]    Physical Exam: Vitals:    05/31/23 1500 05/31/23 1519 05/31/23 1530 05/31/23 1600  BP: 126/72 123/72 111/84 (!) 143/66  Pulse: (!) 101 88 93 98  Resp: 17 18 13 19   Temp:  98 F (36.7 C)  98 F (36.7 C)  TempSrc:  Oral  Oral  SpO2: 100% 98% 100% 100%    Physical Exam Vitals and nursing note reviewed.  Constitutional:      General: She is not in acute distress.    Appearance: She is normal weight. She is not ill-appearing, toxic-appearing or diaphoretic.  HENT:     Head: Normocephalic and atraumatic.     Nose: Nose normal.  Eyes:     General: No scleral icterus. Cardiovascular:     Rate and Rhythm: Normal rate and regular rhythm.     Pulses: Normal pulses.     Heart sounds: Normal heart sounds.  Pulmonary:     Effort: Pulmonary effort is normal. No respiratory distress.     Breath sounds: Normal breath sounds. No wheezing.  Abdominal:     General: Bowel sounds are normal. There is no distension.     Palpations: Abdomen is soft.     Tenderness: There is no abdominal tenderness. There is no guarding.  Musculoskeletal:     Right lower leg: No edema.     Left lower leg: No edema.  Skin:    General: Skin is warm and dry.     Capillary Refill: Capillary refill takes less than 2 seconds.  Neurological:     General: No focal deficit present.     Mental Status: She is alert and oriented to person, place, and time.      Labs on Admission: I have personally reviewed following labs and imaging studies  CBC: Recent Labs  Lab 05/31/23 1217  WBC 9.2  NEUTROABS 5.8  HGB 11.1*  HCT 36.1  MCV 79.9*  PLT 356   Basic Metabolic Panel: Recent Labs  Lab 05/30/23 1543 05/31/23 1217  NA 139 133*  K 6.4* 4.9  CL 101 98  CO2 22 21*  GLUCOSE 242* 181*  BUN 30* 32*  CREATININE 1.54* 1.82*  CALCIUM 10.5* 10.3   GFR: Estimated Creatinine Clearance: 28.3 mL/min (A) (by C-G formula based on SCr of 1.82 mg/dL (H)). Liver Function Tests: Recent Labs  Lab 05/31/23 1217  AST 29  ALT 31  ALKPHOS 70   BILITOT 0.2*  PROT 8.6*  ALBUMIN 4.4    Recent Labs  Lab 05/31/23 1349  INR 1.0   Anemia Panel: Recent Labs    05/30/23 1543  FERRITIN 13*  TIBC 469*  IRON 33   Urine analysis:    Component Value Date/Time   BILIRUBINUR Negative 04/22/2023 1606   PROTEINUR Negative 04/22/2023 1606   UROBILINOGEN 0.2 04/22/2023 1606   NITRITE Negative 04/22/2023 1606   LEUKOCYTESUR Negative 04/22/2023 1606    Radiological Exams on Admission: I have personally reviewed images No results found.  EKG: My personal interpretation of EKG shows: NSR    Assessment/Plan Principal Problem:   GI bleeding Active Problems:   NSAID long-term use   AKI (acute kidney injury) (HCC)   Type 2 diabetes mellitus with stage 3a chronic kidney disease, with long-term current use of insulin (HCC)   Essential hypertension   CKD stage 3a, GFR 45-59 ml/min (HCC) - baseline SCr 1.1   Hyperlipidemia   Chronic headaches   Assessment and Plan: * GI bleeding Observation med/surg bed. Continue with protonix gtts. Heme + stool. GI consulted. Planning on EGD tomorrow.  AKI (acute kidney injury) (HCC) Slightly worse than her baseline Scr of 1.1.  continue with IVF  NSAID long-term use Pt has been using BC Goody's powders TID for several months due to chronic headaches.  Chronic headaches Pt will need to stop BC goody's powder. She will need outpatient referral to neurology for chronic headaches.  Hyperlipidemia Stable. Continue statin.  CKD stage 3a, GFR 45-59 ml/min (HCC) - baseline SCr 1.1 Acutely worse due to GI bleeding and NSAID use/abuse.  Essential hypertension Hold BP meds due to AKI.  Type 2 diabetes mellitus with stage 3a chronic kidney disease, with long-term current  use of insulin (HCC) Add SSI. Hold toujeo for NPO status.    DVT prophylaxis: SCDs Code Status: Full Code Family Communication: no family at bedside  Disposition Plan: return home  Consults called: EDP has consulted  GI  Admission status: Observation, Med-Surg   Carollee Herter, DO [facility] 05/31/2023, 4:09 PM

## 2023-05-31 NOTE — Progress Notes (Signed)
Patient presents today for NV. She reports having a little bit of dizziness. She denies having any other symptoms. Patient currently takes lisinopril 10mg  daily in the mornings. After seeing provider she recommended pt go to ER and get IV fluids. Patient is dehydrated. Pt notified and stated she will go to ER.   Orthostatic Vitals for the past 48 hrs (Last 6 readings):  Patient Position Orthostatic BP Orthostatic Pulse BP Location Cuff Size  05/31/23 1109 Supine 130/70 115 Right Arm Normal  05/31/23 1110 Sitting 110/70 117 Right Arm Normal  05/31/23 1111 Standing 100/70 129 Right Arm Normal

## 2023-05-31 NOTE — Assessment & Plan Note (Signed)
Slightly worse than her baseline Scr of 1.1.  continue with IVF

## 2023-05-31 NOTE — Subjective & Objective (Addendum)
CC: heme + stool HPI: 67 year old African-American female history of type 2 diabetes on insulin, CKD stage IIIa baseline creatinine 1.1, hypertension, chronic headaches who presents to the ER from her PCP office.  Patient was looking pale at work.  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  She was seen by her PCP.  Noted to be orthostatic.  She had heme positive stools.  Patient sent to ER for evaluation.  Patient does note that she has chronic headaches and she has had these for several years.  She has been taking Goody powders 1 packet 3 times a day for the last several months.  She started having some abdominal pain today.  No fevers.  She is notably orthostatic in the office.  On arrival temp 90.3 heart rate 112 blood pressure 126/77 satting 99% on room air.  White count 9.2, hemoglobin 11.1, platelets of 356  Sodium 133, potassium 4.9, bicarb 21, BUN of 32, creatinine 1.8, glucose 181 Total protein 8.6, albumin 4.4, AST 29, ALT 31, alk phos of 70  EKG my interpretation shows normal sinus rhythm.  EDP is consulted Dr. Elnoria Howard with GI.  He plans on upper endoscopy tomorrow.  [facility] hospitalist consulted.

## 2023-05-31 NOTE — ED Triage Notes (Signed)
Patient arrives from a fair at her doctor's office at their advisement. She states while at work she was told by her coworkers told her she didn't look well. Patient endorses blurry vision and headache at that time. Blurry vision resolved but headache remains. The nurses at the doctor's office told her that her heart was racing, she has low iron, and must be dehydrated so should come to the ED to get fluid. Patient denies complaints other than headache. Took a BC powder with some relief.

## 2023-05-31 NOTE — Consult Note (Signed)
Reason for Consult: Abdominal pain, nausea/vomiting Referring Physician:  Family Medicine  Rolland Bimler Deese HPI: This is a 67 year old female with a PMH of two small colonic tubular adenomas (10/2022), HTN, DM, and hyperlipidemia admitted for complaints of feeling unwell.  Her symptoms started very mildly on Sunday.  As time progressed she felt worse.  Her coworkers noted that she did not appear well and she followed up with Dr. Allyne Gee.  In her office she was  was identified to have orthostatic hypotension and heme positive stool.  In the ER her HGB was at baseline in the 11 g/dL range.  She did report having minor hematochezia of late, but it was only streaks of blood on the toilet tissue.  There was no report of any melena.  For the better part of 50 years she used BC powder for her headaches, but she increased it to twice per day for the past two months.  In the past she used Aleve or Advil, but a nephrologist told her to stop as it was adversely affecting her kidneys.  She denies any sharp abdominal pain, but she reports a fullness sensation in her upper abdomen.  Past Medical History:  Diagnosis Date   Arthritis    Carpal tunnel syndrome 01/01/2016   right   Diabetes mellitus without complication (HCC)    High cholesterol    Hypertension     Past Surgical History:  Procedure Laterality Date   CHOLECYSTECTOMY     KNEE ARTHROSCOPY Right 1995   TONSILLECTOMY     TUBAL LIGATION  1986    Family History  Problem Relation Age of Onset   Diabetes Mother    Heart Problems Mother    Hypertension Mother    Diabetes Father    Hypertension Father    Heart Problems Father    Hypertension Sister    Stroke Sister    Hyperlipidemia Sister    Cancer Sister        Adenocarcinoma   Heart Problems Brother    Healthy Son    Healthy Daughter    Healthy Daughter     Social History:  reports that she has never smoked. She has never used smokeless tobacco. She reports current alcohol use.  She reports that she does not use drugs.  Allergies:  Allergies  Allergen Reactions   Latex     Medications: Scheduled: Continuous:  pantoprazole (PROTONIX) IV      Results for orders placed or performed during the hospital encounter of 05/31/23 (from the past 24 hour(s))  Comprehensive metabolic panel     Status: Abnormal   Collection Time: 05/31/23 12:17 PM  Result Value Ref Range   Sodium 133 (L) 135 - 145 mmol/L   Potassium 4.9 3.5 - 5.1 mmol/L   Chloride 98 98 - 111 mmol/L   CO2 21 (L) 22 - 32 mmol/L   Glucose, Bld 181 (H) 70 - 99 mg/dL   BUN 32 (H) 8 - 23 mg/dL   Creatinine, Ser 9.38 (H) 0.44 - 1.00 mg/dL   Calcium 10.1 8.9 - 75.1 mg/dL   Total Protein 8.6 (H) 6.5 - 8.1 g/dL   Albumin 4.4 3.5 - 5.0 g/dL   AST 29 15 - 41 U/L   ALT 31 0 - 44 U/L   Alkaline Phosphatase 70 38 - 126 U/L   Total Bilirubin 0.2 (L) 0.3 - 1.2 mg/dL   GFR, Estimated 30 (L) >60 mL/min   Anion gap 14 5 - 15  CBC with Differential     Status: Abnormal   Collection Time: 05/31/23 12:17 PM  Result Value Ref Range   WBC 9.2 4.0 - 10.5 K/uL   RBC 4.52 3.87 - 5.11 MIL/uL   Hemoglobin 11.1 (L) 12.0 - 15.0 g/dL   HCT 60.4 54.0 - 98.1 %   MCV 79.9 (L) 80.0 - 100.0 fL   MCH 24.6 (L) 26.0 - 34.0 pg   MCHC 30.7 30.0 - 36.0 g/dL   RDW 19.1 (H) 47.8 - 29.5 %   Platelets 356 150 - 400 K/uL   nRBC 0.0 0.0 - 0.2 %   Neutrophils Relative % 64 %   Neutro Abs 5.8 1.7 - 7.7 K/uL   Lymphocytes Relative 27 %   Lymphs Abs 2.5 0.7 - 4.0 K/uL   Monocytes Relative 8 %   Monocytes Absolute 0.7 0.1 - 1.0 K/uL   Eosinophils Relative 1 %   Eosinophils Absolute 0.1 0.0 - 0.5 K/uL   Basophils Relative 0 %   Basophils Absolute 0.0 0.0 - 0.1 K/uL   Immature Granulocytes 0 %   Abs Immature Granulocytes 0.03 0.00 - 0.07 K/uL  ABO/Rh     Status: None   Collection Time: 05/31/23 12:17 PM  Result Value Ref Range   ABO/RH(D)      O POS Performed at [facility], [address], [place], [place] [address]       No results found.  ROS:  As stated above in the HPI otherwise negative.  Blood pressure 126/77, pulse (!) 112, temperature 98.3 F (36.8 C), temperature source Oral, resp. rate 17, SpO2 99%.    PE: Gen: NAD, Alert and Oriented HEENT:  [place]/AT, EOMI Neck: Supple, no LAD Lungs: CTA Bilaterally CV: RRR without M/G/R ABD: Soft, NTND, +BS Ext: No C/C/E  Assessment/Plan: 1) Upper abdominal pain. 2) Anemia. 3) Current NSAID use. 4) Heme positive stool.   There is a suspicion of an upper GI source for her pain and anemia, although her anemia is at baseline.  With IV hydration, it is likely to drop as she presented with orthostatic hypotension.  Currently she is hemodynamically stable.  Plan: 1) Continue with pantoprazole. 2) Monitor HGB and transfuse as necessary. 3) EGD tomorrow at 12:30 PM. 4) Hold NSAIDs.  Jakobee Brackins D 05/31/2023, 2:13 PM

## 2023-05-31 NOTE — ED Provider Notes (Signed)
[place] EMERGENCY DEPARTMENT AT [facility] Provider Note   CSN: [id number] Arrival date & time: 05/31/23  1133     History  No chief complaint on file.   Heather Tanner is a 67 y.o. female w/ hx of T2DM, HTN, HLD that p/f tachycardia.   Felt "off" this morning. Went to work and was having sweating, generalized weakness, dizziness, blurry vision this morning (she has it at baseline from cataracts, but was worse this morning). Her coworkers were concerned so had her go to her PCP. At PCP, she was noted to be tachycardic and recommended to go to ED. Pt was seen at PCP yesterday as well, was noted to have anemia and positive stool occult.   Pt reports about 1 month ago, had episodes of bright red blood in stool that has since resolved. Denies melena. She has been having dull, intermittent lower abdominal pain for past month. Does not feel related to BM's or eating. She has also been having decreased appetite and dizziness with standing suddenly.   Takes a baby aspirin nightly. Takes BC powder daily for headaches. Denies heartburn or reflux. No hx of alcohol use, hepatitis, cirrhosis, or liver issues. No SOB, CP, vomiting, diarrhea, fever.   - Had colonoscopy November 2023. Was told she had polyps and noncancerous, were removed. Was told she needs f/u in 7 years        Home Medications Prior to Admission medications   Medication Sig Start Date End Date Taking? Authorizing Provider  Albuterol Sulfate (PROAIR RESPICLICK) 108 (90 Base) MCG/ACT AEPB 2 puffs q6h prn 09/08/22   Dorothyann Peng, MD  atorvastatin (LIPITOR) 40 MG tablet TAKE 1 TABLET BY MOUTH ON MONDAY, [facility], AND FRIDAY 11/08/22   Dorothyann Peng, MD  BABY ASPIRIN PO Take by mouth daily.    [provider]  Blood Glucose Monitoring Suppl (ONE TOUCH ULTRA MINI) w/Device KIT Use as instructed to check blood sugars 3 times per day dx: e11.22 11/20/20   Dorothyann Peng, MD  Calcium-Vitamin D-Vitamin K  (VIACTIV PO) Take by mouth. 2 per day    [provider]  cetirizine (ZYRTEC ALLERGY) 10 MG tablet Take 1 tablet (10 mg total) by mouth daily. 12/27/22 12/27/23  Dorothyann Peng, MD  co-enzyme Q-10 30 MG capsule Take 30 mg by mouth 3 (three) times daily.    [provider]  Continuous Glucose Sensor (DEXCOM G7 SENSOR) MISC Use as directed 04/22/23   Dorothyann Peng, MD  dapagliflozin propanediol (FARXIGA) 10 MG TABS tablet Take 1 tablet (10 mg total) by mouth daily before breakfast. 05/30/23   Dorothyann Peng, MD  DULoxetine (CYMBALTA) 60 MG capsule TAKE ONE CAPSULE BY MOUTH DAILY 09/01/22   Levert Feinstein, MD  famotidine (PEPCID) 20 MG tablet TAKE 1 TABLET BY MOUTH 2 TIMES A DAY 04/08/23   Dorothyann Peng, MD  HYDROcodone-acetaminophen [facility]) 10-325 MG tablet Take 1 tablet by mouth 3 (three) times daily as needed. 07/30/21   [provider]  insulin glargine, 1 Unit Dial, (TOUJEO SOLOSTAR) 300 UNIT/ML Solostar Pen Inject 20 units sq nightly, max titration dose 60 units 05/30/23   Dorothyann Peng, MD  Insulin Pen Needle (PEN NEEDLES) 31G X 5 MM MISC Inject 1 each into the skin daily. 12/09/21   Dorothyann Peng, MD  JANUMET XR [id number] MG TB24 TAKE 1 TABLET BY MOUTH EVERY EVENING 05/11/23   Dorothyann Peng, MD  Lancets [facility] DELICA PLUS [place]) MISC USE TO CHECK BLOOD SUGAR THREE TIMES A DAY 12/27/21  Dorothyann Peng, MD  lisinopril (ZESTRIL) 10 MG tablet TAKE 1 TABLET BY MOUTH DAILY 03/30/23   Dorothyann Peng, MD  Misc Natural Products [facility] MUSHROOM MIX) CAPS Take by mouth.    [provider]  Multiple Vitamin (MULTIVITAMIN) tablet Take 1 tablet by mouth daily.    [provider]  Kerlan [facility] ULTRA test strip USE AS INSTRUCTED TO CHECK BLOOD SUGAR THREE TIMES A DAY 04/20/21   Dorothyann Peng, MD  OVER THE COUNTER MEDICATION Total beet chew    [provider]  pravastatin (PRAVACHOL) 40 MG tablet TAKE ONE TABLET BY MOUTH DAILY ON MONDAY, WEDNESDAY, AND FRIDAY 06/10/21    Dorothyann Peng, MD  pregabalin (LYRICA) 100 MG capsule TAKE 1 CAPSULE(S) BY MOUTH THREE TIMES A DAY 07/08/22   Dorothyann Peng, MD  temazepam (RESTORIL) 15 MG capsule temazepam 15 mg capsule    [provider]  Turmeric-Ginger 150-25 MG CHEW Chew by mouth.    [provider]      Allergies    Latex    Review of Systems   Review of Systems  Physical Exam Updated Vital Signs BP 121/75   Pulse 92   Temp 98.3 F (36.8 C) (Oral)   Resp 20   SpO2 100%  Physical Exam Gen: Alert, nontoxic appearing woman sitting in chair. HEENT: NCAT. MMM. CV: rapid rate, regular rhythm. No murmurs. 2+ radial pulses. Cap refill <2.  Resp: CTAB, no crackles or wheezing. Normal WOB on RA Abm: RUQ and LUQ tenderness. Soft, nondistended. BS present. No rebound, guarding, or rigidity.  Skin: Warm, well perfused and dry  ED Results / Procedures / Treatments   Labs (all labs ordered are listed, but only abnormal results are displayed) Labs Reviewed  COMPREHENSIVE METABOLIC PANEL - Abnormal; Notable for the following components:      Result Value   Sodium 133 (*)    CO2 21 (*)    Glucose, Bld 181 (*)    BUN 32 (*)    Creatinine, Ser 1.82 (*)    Total Protein 8.6 (*)    Total Bilirubin 0.2 (*)    GFR, Estimated 30 (*)    All other components within normal limits  CBC WITH DIFFERENTIAL/PLATELET - Abnormal; Notable for the following components:   Hemoglobin 11.1 (*)    MCV 79.9 (*)    MCH 24.6 (*)    RDW 16.7 (*)    All other components within normal limits  PROTIME-INR  TYPE AND SCREEN  ABO/RH    EKG EKG Interpretation Date/Time:  Tuesday May 31 2023 12:23:51 EDT Ventricular Rate:  96 PR Interval:  120 QRS Duration:  87 QT Interval:  328 QTC Calculation: 415 R Axis:   45  Text Interpretation: Sinus rhythm Nonspecific repol abnormality, inferior leads Nonspecific ST changes No previous ECGs available Confirmed by Ernie Avena ([id number]) on 05/31/2023 1:21:22  PM  Radiology No results found.  Procedures Procedures    Medications Ordered in ED Medications  lactated ringers bolus 1,000 mL (0 mLs Intravenous Stopped 05/31/23 1430)  pantoprazole (PROTONIX) 80 mg /NS 100 mL IVPB (80 mg Intravenous New Bag/Given 05/31/23 1423)    ED Course/ Medical Decision Making/ A&P                               Medical Decision Making:   Heather Tanner is a 67 y.o. female who presented to the ED today with tachycardia detailed above.  Complete initial physical exam performed, notably the patient  was tender to paltion in upper quadrants.    Reviewed and confirmed nursing documentation for past medical history, family history, social history.    Initial Assessment:   With the patient's presentation of tachycardia, abm pain, and bloody stool, most likely diagnosis is symptomatic anemia 2/2 GI bleed. Differential for GI bleed includes PUD (2/2 chronic NSAID use), gastritis, hemorrhoids, IBD, appendicitis, diverticulitis, bowel ischemia, SBO, variceal bleed. Afebrile and no peritoneal signs on exam, thus imaging not indicated at this time.  This is most consistent with an acute complicated illness  Initial Plan:  1L LR bolus, IV protonix 80mg  for suspected GI bleed  PT/INR Type and screen in case transfusion is needed 2 large bore IV Screening labs including CBC and Metabolic panel to evaluate for infectious or metabolic etiology of disease.  EKG to evaluate for cardiac pathology Objective evaluation as below reviewed   Initial Study Results:   Laboratory  All laboratory results reviewed without evidence of clinically relevant pathology.   - Hgb 11, improved from 9 on 7/19 - AKI: Cr 1.82  EKG EKG was reviewed independently. Rate, rhythm, axis, intervals all examined and without medically relevant abnormality. ST segments without concerns for elevations.    Consults: Case discussed with Gastroenterology (Dr. Elnoria Howard). They plan to do EGD  tomorrow.   Reassessment and Plan:   - Pt has AKI. Cr uptrended to 1.82 (baseline ~ 1.1) - Anemia is stable/slightly improved compared to 1 month ago. Transfusion not indicated at this time.  - Given AKI and concern for GI bleed and worsening anemia, recommend admission for further management. Will get EGD w/ GI.    Final Clinical Impression(s) / ED Diagnoses Final diagnoses:  Iron deficiency anemia due to chronic blood loss  AKI (acute kidney injury) [facility])    Rx / DC Orders ED Discharge Orders     None         Lincoln Brigham, MD 05/31/23 1457    Ernie Avena, MD 05/31/23 1511

## 2023-05-31 NOTE — Assessment & Plan Note (Signed)
Stable.  Continue statin. 

## 2023-05-31 NOTE — ED Notes (Signed)
Call daughter with updates

## 2023-05-31 NOTE — Assessment & Plan Note (Signed)
Pt has been using BC [employer] powders TID for several months due to chronic headaches.

## 2023-05-31 NOTE — Assessment & Plan Note (Signed)
Add SSI. Hold toujeo for NPO status.

## 2023-05-31 NOTE — Assessment & Plan Note (Signed)
Hold BP meds due to AKI.

## 2023-05-31 NOTE — ED Notes (Signed)
ED TO INPATIENT HANDOFF REPORT  ED Nurse Name and Phone #:  Marisue Ivan [phone]  S Name/Age/Gender Rolland Bimler Deese 67 y.o. female Room/Bed: 027C/027C  Code Status   Code Status: Full Code  Home/SNF/Other Home Patient oriented to: self, place, time, and situation Is this baseline? Yes   Triage Complete: Triage complete  Chief Complaint GI bleeding [K92.2]  Triage Note Patient arrives from a fair at her doctor's office at their advisement. She states while at work she was told by her coworkers told her she didn't look well. Patient endorses blurry vision and headache at that time. Blurry vision resolved but headache remains. The nurses at the doctor's office told her that her heart was racing, she has low iron, and must be dehydrated so should come to the ED to get fluid. Patient denies complaints other than headache. Took a BC powder with some relief.    Allergies Allergies  Allergen Reactions   Latex     Level of Care/Admitting Diagnosis ED Disposition     ED Disposition  Admit   Condition  --   Comment  Hospital Area: [facility] [[id number]]  Level of Care: Med-Surg [16]  May place patient in observation at [facility] or [facility] if equivalent level of care is available:: No  Covid Evaluation: Asymptomatic - no recent exposure (last 10 days) testing not required  Diagnosis: GI bleeding [213086]  Admitting Physician: Imogene Burn, ERIC [[id number]]  Attending Physician: Imogene Burn, ERIC [[id number]]          B Medical/Surgery History Past Medical History:  Diagnosis Date   Arthritis    Carpal tunnel syndrome 01/01/2016   right   Diabetes mellitus without complication (HCC)    High cholesterol    Hypertension    Past Surgical History:  Procedure Laterality Date   CHOLECYSTECTOMY     KNEE ARTHROSCOPY Right 1995   TONSILLECTOMY     TUBAL LIGATION  1986     A IV Location/Drains/Wounds Patient Lines/Drains/Airways Status     Active Line/Drains/Airways      Name Placement date Placement time Site Days   Peripheral IV 05/31/23 20 G Left Antecubital 05/31/23  1233  Antecubital  less than 1            Intake/Output Last 24 hours No intake or output data in the 24 hours ending 05/31/23 1510  Labs/Imaging Results for orders placed or performed during the hospital encounter of 05/31/23 (from the past 48 hour(s))  Comprehensive metabolic panel     Status: Abnormal   Collection Time: 05/31/23 12:17 PM  Result Value Ref Range   Sodium 133 (L) 135 - 145 mmol/L   Potassium 4.9 3.5 - 5.1 mmol/L   Chloride 98 98 - 111 mmol/L   CO2 21 (L) 22 - 32 mmol/L   Glucose, Bld 181 (H) 70 - 99 mg/dL    Comment: Glucose reference range applies only to samples taken after fasting for at least 8 hours.   BUN 32 (H) 8 - 23 mg/dL   Creatinine, Ser 5.78 (H) 0.44 - 1.00 mg/dL   Calcium 46.9 8.9 - 62.9 mg/dL   Total Protein 8.6 (H) 6.5 - 8.1 g/dL   Albumin 4.4 3.5 - 5.0 g/dL   AST 29 15 - 41 U/L   ALT 31 0 - 44 U/L   Alkaline Phosphatase 70 38 - 126 U/L   Total Bilirubin 0.2 (L) 0.3 - 1.2 mg/dL   GFR, Estimated 30 (L) >60 mL/min  Comment: (NOTE) Calculated using the CKD-EPI Creatinine Equation (2021)    Anion gap 14 5 - 15    Comment: Performed at [facility], [address], [place], [place] [address]  CBC with Differential     Status: Abnormal   Collection Time: 05/31/23 12:17 PM  Result Value Ref Range   WBC 9.2 4.0 - 10.5 K/uL   RBC 4.52 3.87 - 5.11 MIL/uL   Hemoglobin 11.1 (L) 12.0 - 15.0 g/dL   HCT 60.4 54.0 - 98.1 %   MCV 79.9 (L) 80.0 - 100.0 fL   MCH 24.6 (L) 26.0 - 34.0 pg   MCHC 30.7 30.0 - 36.0 g/dL   RDW 19.1 (H) 47.8 - 29.5 %   Platelets 356 150 - 400 K/uL   nRBC 0.0 0.0 - 0.2 %   Neutrophils Relative % 64 %   Neutro Abs 5.8 1.7 - 7.7 K/uL   Lymphocytes Relative 27 %   Lymphs Abs 2.5 0.7 - 4.0 K/uL   Monocytes Relative 8 %   Monocytes Absolute 0.7 0.1 - 1.0 K/uL   Eosinophils Relative 1 %   Eosinophils Absolute 0.1 0.0 -  0.5 K/uL   Basophils Relative 0 %   Basophils Absolute 0.0 0.0 - 0.1 K/uL   Immature Granulocytes 0 %   Abs Immature Granulocytes 0.03 0.00 - 0.07 K/uL    Comment: Performed at [facility], [address], [place], [place] [address]  ABO/Rh     Status: None   Collection Time: 05/31/23 12:17 PM  Result Value Ref Range   ABO/RH(D)      O POS Performed at [facility], [address] [place], [place] [address]   Protime-INR     Status: None   Collection Time: 05/31/23  1:49 PM  Result Value Ref Range   Prothrombin Time 13.7 11.4 - 15.2 seconds   INR 1.0 0.8 - 1.2    Comment: (NOTE) INR goal varies based on device and disease states. Performed at [facility], [address], [place], [place] [address]   Type and screen [facility]     Status: None   Collection Time: 05/31/23  1:49 PM  Result Value Ref Range   ABO/RH(D) O POS    Antibody Screen NEG    Sample Expiration      06/03/2023,2359 Performed at [facility], [address], [place], [place] [address]    No results found.  Pending Labs Unresulted Labs (From admission, onward)     Start     Ordered   05/31/23 1500  Salicylate level  Add-on,   AD        05/31/23 1500            Vitals/Pain Today's Vitals   05/31/23 1146 05/31/23 1148 05/31/23 1415 05/31/23 1445  BP:   130/69 121/75  Pulse:   89 92  Resp:   18 20  Temp:  98.3 F (36.8 C)    TempSrc:  Oral    SpO2:   97% 100%  PainSc: 8        Isolation Precautions No active isolations  Medications Medications  lactated ringers bolus 1,000 mL (0 mLs Intravenous Stopped 05/31/23 1430)  pantoprazole (PROTONIX) 80 mg /NS 100 mL IVPB (0 mg Intravenous Stopped 05/31/23 1457)    Mobility walks     Focused Assessments Cardiac Assessment Handoff:  Cardiac Rhythm: Sinus tachycardia Lab Results  Component Value Date  CKTOTAL 173 09/09/2021   No results found for: "DDIMER" Does the Patient currently have  chest pain? No    R Recommendations: See Admitting Provider Note  Report given to:   Additional Notes:

## 2023-05-31 NOTE — H&P (View-Only) (Signed)
Reason for Consult: Abdominal pain, nausea/vomiting Referring Physician:  Family Medicine  Rolland Bimler Deese HPI: This is a 67 year old female with a PMH of two small colonic tubular adenomas (10/2022), HTN, DM, and hyperlipidemia admitted for complaints of feeling unwell.  Her symptoms started very mildly on Sunday.  As time progressed she felt worse.  Her coworkers noted that she did not appear well and she followed up with Dr. Allyne Gee.  In her office she was  was identified to have orthostatic hypotension and heme positive stool.  In the ER her HGB was at baseline in the 11 g/dL range.  She did report having minor hematochezia of late, but it was only streaks of blood on the toilet tissue.  There was no report of any melena.  For the better part of 50 years she used BC powder for her headaches, but she increased it to twice per day for the past two months.  In the past she used Aleve or Advil, but a nephrologist told her to stop as it was adversely affecting her kidneys.  She denies any sharp abdominal pain, but she reports a fullness sensation in her upper abdomen.  Past Medical History:  Diagnosis Date   Arthritis    Carpal tunnel syndrome 01/01/2016   right   Diabetes mellitus without complication (HCC)    High cholesterol    Hypertension     Past Surgical History:  Procedure Laterality Date   CHOLECYSTECTOMY     KNEE ARTHROSCOPY Right 1995   TONSILLECTOMY     TUBAL LIGATION  1986    Family History  Problem Relation Age of Onset   Diabetes Mother    Heart Problems Mother    Hypertension Mother    Diabetes Father    Hypertension Father    Heart Problems Father    Hypertension Sister    Stroke Sister    Hyperlipidemia Sister    Cancer Sister        Adenocarcinoma   Heart Problems Brother    Healthy Son    Healthy Daughter    Healthy Daughter     Social History:  reports that she has never smoked. She has never used smokeless tobacco. She reports current alcohol use.  She reports that she does not use drugs.  Allergies:  Allergies  Allergen Reactions   Latex     Medications: Scheduled: Continuous:  pantoprazole (PROTONIX) IV      Results for orders placed or performed during the hospital encounter of 05/31/23 (from the past 24 hour(s))  Comprehensive metabolic panel     Status: Abnormal   Collection Time: 05/31/23 12:17 PM  Result Value Ref Range   Sodium 133 (L) 135 - 145 mmol/L   Potassium 4.9 3.5 - 5.1 mmol/L   Chloride 98 98 - 111 mmol/L   CO2 21 (L) 22 - 32 mmol/L   Glucose, Bld 181 (H) 70 - 99 mg/dL   BUN 32 (H) 8 - 23 mg/dL   Creatinine, Ser 9.38 (H) 0.44 - 1.00 mg/dL   Calcium 10.1 8.9 - 75.1 mg/dL   Total Protein 8.6 (H) 6.5 - 8.1 g/dL   Albumin 4.4 3.5 - 5.0 g/dL   AST 29 15 - 41 U/L   ALT 31 0 - 44 U/L   Alkaline Phosphatase 70 38 - 126 U/L   Total Bilirubin 0.2 (L) 0.3 - 1.2 mg/dL   GFR, Estimated 30 (L) >60 mL/min   Anion gap 14 5 - 15  CBC with Differential     Status: Abnormal   Collection Time: 05/31/23 12:17 PM  Result Value Ref Range   WBC 9.2 4.0 - 10.5 K/uL   RBC 4.52 3.87 - 5.11 MIL/uL   Hemoglobin 11.1 (L) 12.0 - 15.0 g/dL   HCT 60.4 54.0 - 98.1 %   MCV 79.9 (L) 80.0 - 100.0 fL   MCH 24.6 (L) 26.0 - 34.0 pg   MCHC 30.7 30.0 - 36.0 g/dL   RDW 19.1 (H) 47.8 - 29.5 %   Platelets 356 150 - 400 K/uL   nRBC 0.0 0.0 - 0.2 %   Neutrophils Relative % 64 %   Neutro Abs 5.8 1.7 - 7.7 K/uL   Lymphocytes Relative 27 %   Lymphs Abs 2.5 0.7 - 4.0 K/uL   Monocytes Relative 8 %   Monocytes Absolute 0.7 0.1 - 1.0 K/uL   Eosinophils Relative 1 %   Eosinophils Absolute 0.1 0.0 - 0.5 K/uL   Basophils Relative 0 %   Basophils Absolute 0.0 0.0 - 0.1 K/uL   Immature Granulocytes 0 %   Abs Immature Granulocytes 0.03 0.00 - 0.07 K/uL  ABO/Rh     Status: None   Collection Time: 05/31/23 12:17 PM  Result Value Ref Range   ABO/RH(D)      O POS Performed at [facility], [address], [place], [place] [address]       No results found.  ROS:  As stated above in the HPI otherwise negative.  Blood pressure 126/77, pulse (!) 112, temperature 98.3 F (36.8 C), temperature source Oral, resp. rate 17, SpO2 99%.    PE: Gen: NAD, Alert and Oriented HEENT:  [place]/AT, EOMI Neck: Supple, no LAD Lungs: CTA Bilaterally CV: RRR without M/G/R ABD: Soft, NTND, +BS Ext: No C/C/E  Assessment/Plan: 1) Upper abdominal pain. 2) Anemia. 3) Current NSAID use. 4) Heme positive stool.   There is a suspicion of an upper GI source for her pain and anemia, although her anemia is at baseline.  With IV hydration, it is likely to drop as she presented with orthostatic hypotension.  Currently she is hemodynamically stable.  Plan: 1) Continue with pantoprazole. 2) Monitor HGB and transfuse as necessary. 3) EGD tomorrow at 12:30 PM. 4) Hold NSAIDs.  Jakobee Brackins D 05/31/2023, 2:13 PM

## 2023-05-31 NOTE — Assessment & Plan Note (Signed)
Acutely worse due to GI bleeding and NSAID use/abuse.

## 2023-05-31 NOTE — Assessment & Plan Note (Signed)
Pt will need to stop BC goody's powder. She will need outpatient referral to neurology for chronic headaches.

## 2023-05-31 NOTE — Assessment & Plan Note (Signed)
Observation med/surg bed. Continue with protonix gtts. Heme + stool. GI consulted. Planning on EGD tomorrow.

## 2023-06-01 ENCOUNTER — Encounter (HOSPITAL_COMMUNITY): Payer: Self-pay | Admitting: Internal Medicine

## 2023-06-01 ENCOUNTER — Encounter (HOSPITAL_COMMUNITY)
Admission: EM | Disposition: A | Discharge status: Home / Self Care | Payer: Self-pay | Source: Home / Self Care | Attending: Emergency Medicine

## 2023-06-01 ENCOUNTER — Other Ambulatory Visit: Payer: Self-pay | Admitting: Internal Medicine

## 2023-06-01 ENCOUNTER — Observation Stay (HOSPITAL_COMMUNITY): Payer: 59

## 2023-06-01 ENCOUNTER — Observation Stay (HOSPITAL_BASED_OUTPATIENT_CLINIC_OR_DEPARTMENT_OTHER): Payer: 59

## 2023-06-01 DIAGNOSIS — D500 Iron deficiency anemia secondary to blood loss (chronic): Secondary | ICD-10-CM | POA: Diagnosis not present

## 2023-06-01 DIAGNOSIS — I129 Hypertensive chronic kidney disease with stage 1 through stage 4 chronic kidney disease, or unspecified chronic kidney disease: Secondary | ICD-10-CM | POA: Diagnosis not present

## 2023-06-01 DIAGNOSIS — N179 Acute kidney failure, unspecified: Secondary | ICD-10-CM | POA: Diagnosis not present

## 2023-06-01 DIAGNOSIS — N1831 Chronic kidney disease, stage 3a: Secondary | ICD-10-CM

## 2023-06-01 DIAGNOSIS — E860 Dehydration: Secondary | ICD-10-CM | POA: Diagnosis not present

## 2023-06-01 DIAGNOSIS — K259 Gastric ulcer, unspecified as acute or chronic, without hemorrhage or perforation: Secondary | ICD-10-CM

## 2023-06-01 DIAGNOSIS — E1122 Type 2 diabetes mellitus with diabetic chronic kidney disease: Secondary | ICD-10-CM

## 2023-06-01 DIAGNOSIS — K922 Gastrointestinal hemorrhage, unspecified: Secondary | ICD-10-CM | POA: Diagnosis not present

## 2023-06-01 HISTORY — PX: BIOPSY: SHX5522

## 2023-06-01 HISTORY — PX: ESOPHAGOGASTRODUODENOSCOPY (EGD) WITH PROPOFOL: SHX5813

## 2023-06-01 LAB — GLUCOSE, CAPILLARY
Glucose-Capillary: 125 mg/dL — ABNORMAL HIGH (ref 70–99)
Glucose-Capillary: 153 mg/dL — ABNORMAL HIGH (ref 70–99)
Glucose-Capillary: 176 mg/dL — ABNORMAL HIGH (ref 70–99)
Glucose-Capillary: 141 mg/dL — ABNORMAL HIGH (ref 70–99)
Glucose-Capillary: 135 mg/dL — ABNORMAL HIGH (ref 70–99)
Glucose-Capillary: 176 mg/dL — ABNORMAL HIGH (ref 70–99)
Glucose-Capillary: 240 mg/dL — ABNORMAL HIGH (ref 70–99)

## 2023-06-01 LAB — CBC WITH DIFFERENTIAL/PLATELET
Abs Immature Granulocytes: 0.03 10*3/uL (ref 0.00–0.07)
Basophils Absolute: 0 10*3/uL (ref 0.0–0.1)
Basophils Relative: 0 %
Eosinophils Absolute: 0.1 10*3/uL (ref 0.0–0.5)
Eosinophils Relative: 2 %
HCT: 29.6 % — ABNORMAL LOW (ref 36.0–46.0)
Hemoglobin: 9.2 g/dL — ABNORMAL LOW (ref 12.0–15.0)
Immature Granulocytes: 1 %
Lymphocytes Relative: 37 %
Lymphs Abs: 2.3 10*3/uL (ref 0.7–4.0)
MCH: 23.9 pg — ABNORMAL LOW (ref 26.0–34.0)
MCHC: 31.1 g/dL (ref 30.0–36.0)
MCV: 76.9 fL — ABNORMAL LOW (ref 80.0–100.0)
Monocytes Absolute: 0.5 10*3/uL (ref 0.1–1.0)
Monocytes Relative: 8 %
Neutro Abs: 3.3 10*3/uL (ref 1.7–7.7)
Neutrophils Relative %: 52 %
Platelets: 311 10*3/uL (ref 150–400)
RBC: 3.85 MIL/uL — ABNORMAL LOW (ref 3.87–5.11)
RDW: 16.6 % — ABNORMAL HIGH (ref 11.5–15.5)
WBC: 6.2 10*3/uL (ref 4.0–10.5)
nRBC: 0 % (ref 0.0–0.2)

## 2023-06-01 LAB — COMPREHENSIVE METABOLIC PANEL
ALT: 25 U/L (ref 0–44)
AST: 25 U/L (ref 15–41)
Albumin: 3.5 g/dL (ref 3.5–5.0)
Alkaline Phosphatase: 56 U/L (ref 38–126)
Anion gap: 9 (ref 5–15)
BUN: 21 mg/dL (ref 8–23)
CO2: 22 mmol/L (ref 22–32)
Calcium: 9.2 mg/dL (ref 8.9–10.3)
Chloride: 105 mmol/L (ref 98–111)
Creatinine, Ser: 1.57 mg/dL — ABNORMAL HIGH (ref 0.44–1.00)
GFR, Estimated: 35 mL/min — ABNORMAL LOW (ref >60)
Glucose, Bld: 181 mg/dL — ABNORMAL HIGH (ref 70–99)
Potassium: 4.8 mmol/L (ref 3.5–5.1)
Sodium: 136 mmol/L (ref 135–145)
Total Bilirubin: 0.6 mg/dL (ref 0.3–1.2)
Total Protein: 7 g/dL (ref 6.5–8.1)

## 2023-06-01 LAB — HIV ANTIBODY (ROUTINE TESTING W REFLEX): HIV Screen 4th Generation wRfx: NONREACTIVE

## 2023-06-01 LAB — MAGNESIUM: Magnesium: 2 mg/dL (ref 1.7–2.4)

## 2023-06-01 SURGERY — ESOPHAGOGASTRODUODENOSCOPY (EGD) WITH PROPOFOL
Anesthesia: Monitor Anesthesia Care

## 2023-06-01 MED ORDER — LIDOCAINE 2% (20 MG/ML) 5 ML SYRINGE
INTRAMUSCULAR | Status: DC | PRN
  Administered 2023-06-01: 80 mg via INTRAVENOUS

## 2023-06-01 MED ORDER — PROPOFOL 10 MG/ML IV BOLUS
INTRAVENOUS | Status: DC | PRN
Start: 2023-06-01 — End: 2023-06-01
  Administered 2023-06-01: 230 mg via INTRAVENOUS

## 2023-06-01 MED ORDER — SODIUM CHLORIDE 0.9 % IV SOLN: INTRAVENOUS | Status: DC

## 2023-06-01 MED ORDER — ESMOLOL HCL 100 MG/10ML IV SOLN
INTRAVENOUS | Status: DC | PRN
  Administered 2023-06-01 (×2): 10 mg via INTRAVENOUS

## 2023-06-01 MED ORDER — LACTATED RINGERS IV SOLN: INTRAVENOUS | Status: DC | PRN

## 2023-06-01 SURGICAL SUPPLY — 15 items
BLOCK BITE 60FR ADLT L/F BLUE (MISCELLANEOUS) ×3 IMPLANT
ELECT REM PT RETURN 9FT ADLT (ELECTROSURGICAL)
ELECTRODE REM PT RTRN 9FT ADLT (ELECTROSURGICAL) IMPLANT
FORCEP RJ3 GP 1.8X160 W-NEEDLE (CUTTING FORCEPS) IMPLANT
FORCEPS BIOP RAD 4 LRG CAP 4 (CUTTING FORCEPS) IMPLANT
NDL SCLEROTHERAPY 25GX240 (NEEDLE) IMPLANT
NEEDLE SCLEROTHERAPY 25GX240 (NEEDLE) IMPLANT
PROBE APC STR FIRE (PROBE) IMPLANT
PROBE INJECTION GOLD (MISCELLANEOUS)
PROBE INJECTION GOLD 7FR (MISCELLANEOUS) IMPLANT
SNARE SHORT THROW 13M SML OVAL (MISCELLANEOUS) IMPLANT
SYR 50ML LL SCALE MARK (SYRINGE) IMPLANT
TUBING ENDO SMARTCAP PENTAX (MISCELLANEOUS) ×4 IMPLANT
TUBING IRRIGATION ENDOGATOR (MISCELLANEOUS) ×2 IMPLANT
WATER STERILE IRR 1000ML POUR (IV SOLUTION) IMPLANT

## 2023-06-01 NOTE — Anesthesia Preprocedure Evaluation (Addendum)
Anesthesia Evaluation  Patient identified by MRN, date of birth, ID band Patient awake    Reviewed: Allergy & Precautions, NPO status , Patient's Chart, lab work & pertinent test results  History of Anesthesia Complications Negative for: history of anesthetic complications  Airway Mallampati: II  TM Distance: >3 FB Neck ROM: Full    Dental  (+) Missing,    Pulmonary neg pulmonary ROS   Pulmonary exam normal        Cardiovascular hypertension, Pt. on medications Normal cardiovascular exam     Neuro/Psych  Headaches    GI/Hepatic Neg liver ROS,GERD  Medicated,,Abdominal pain and possible GI bleed   Endo/Other  diabetes, Type 2, Insulin Dependent    Renal/GU negative Renal ROS     Musculoskeletal  (+) Arthritis ,    Abdominal   Peds  Hematology  (+) Blood dyscrasia (Hgb 9.2), anemia   Anesthesia Other Findings Day of surgery medications reviewed with patient.  Reproductive/Obstetrics                              Anesthesia Physical Anesthesia Plan  ASA: 3  Anesthesia Plan: MAC   Post-op Pain Management: Minimal or no pain anticipated   Induction:   PONV Risk Score and Plan: 2 and Treatment may vary due to age or medical condition and Propofol infusion  Airway Management Planned: Natural Airway and Nasal Cannula  Additional Equipment: None  Intra-op Plan:   Post-operative Plan:   Informed Consent: I have reviewed the patients History and Physical, chart, labs and discussed the procedure including the risks, benefits and alternatives for the proposed anesthesia with the patient or authorized representative who has indicated his/her understanding and acceptance.       Plan Discussed with: CRNA  Anesthesia Plan Comments:          Anesthesia Quick Evaluation

## 2023-06-01 NOTE — Anesthesia Postprocedure Evaluation (Signed)
Anesthesia Post Note  Patient: Heather Tanner  Procedure(s) Performed: ESOPHAGOGASTRODUODENOSCOPY (EGD) WITH PROPOFOL BIOPSY     Patient location during evaluation: PACU Anesthesia Type: MAC Level of consciousness: awake and alert Pain management: pain level controlled Vital Signs Assessment: post-procedure vital signs reviewed and stable Respiratory status: spontaneous breathing, nonlabored ventilation and respiratory function stable Cardiovascular status: blood pressure returned to baseline Postop Assessment: no apparent nausea or vomiting Anesthetic complications: no   No notable events documented.  Last Vitals:  Vitals:   06/01/23 1330 06/01/23 1344  BP: 106/62 125/71  Pulse: 97 (!) 107  Resp: 16 19  Temp:  36.7 C  SpO2: 96% 99%    Last Pain:  Vitals:   06/01/23 1344  TempSrc: Oral  PainSc:                  [name]

## 2023-06-01 NOTE — Interval H&P Note (Signed)
History and Physical Interval Note:  06/01/2023 12:29 PM  Heather Tanner  has presented today for surgery, with the diagnosis of Abdominal pain and possible GI bleed.  The various methods of treatment have been discussed with the patient and family. After consideration of risks, benefits and other options for treatment, the patient has consented to  Procedure(s): ESOPHAGOGASTRODUODENOSCOPY (EGD) WITH PROPOFOL (N/A) as a surgical intervention.  The patient's history has been reviewed, patient examined, no change in status, stable for surgery.  I have reviewed the patient's chart and labs.  Questions were answered to the patient's satisfaction.     [name]

## 2023-06-01 NOTE — Op Note (Signed)
Heather Tanner Patient Name: Heather Tanner [facility] Procedure Date : 06/01/2023 MRN: [id number] Attending MD: Jeani Hawking , MD, [id number] Date of Birth: [birth year] CSN: [id number] Age: 67 Admit Type: Inpatient Procedure:                Upper GI endoscopy Indications:              Epigastric abdominal pain, Iron deficiency anemia,                            Heme positive stool Providers:                Jeani Hawking, MD, Martha Clan, RN, Sunday Corn                            [place], Technician Referring MD:              Medicines:                Propofol per Anesthesia Complications:            No immediate complications. Estimated Blood Loss:     Estimated blood loss was minimal. Procedure:                Pre-Anesthesia Assessment:                           - Prior to the procedure, a History and Physical                            was performed, and patient medications and                            allergies were reviewed. The patient's tolerance of                            previous anesthesia was also reviewed. The risks                            and benefits of the procedure and the sedation                            options and risks were discussed with the patient.                            All questions were answered, and informed consent                            was obtained. Prior Anticoagulants: The patient has                            taken no anticoagulant or antiplatelet agents. ASA                            Grade Assessment: II - A patient with mild systemic  disease. After reviewing the risks and benefits,                            the patient was deemed in satisfactory condition to                            undergo the procedure.                           - Sedation was administered by an anesthesia                            professional. Deep sedation was attained.                           After obtaining  informed consent, the endoscope was                            passed under direct vision. Throughout the                            procedure, the patient's blood pressure, pulse, and                            oxygen saturations were monitored continuously. The                            [id number]-H190 ([id number] Olympus endoscope was introduced                            through the mouth, and advanced to the second part                            of duodenum. The upper GI endoscopy was                            accomplished without difficulty. The patient                            tolerated the procedure well. Scope In: Scope Out: Findings:      The esophagus was normal.      Two non-bleeding superficial gastric ulcers with a clean ulcer base       (Forrest Class III) were found in the gastric fundus. The largest lesion       was 5 mm in largest dimension. Biopsies were taken with a cold forceps       for Helicobacter pylori testing.      The examined duodenum was normal.      Two ulcerations were noted in the fundus, but they appeared more to be       erosions. It may be that they were in the process of healing with       pantoprazole. The ulcerations were no suspicious in appearance and       appeared benign. In this region the gastric folds were atrophic. There       was a  diffuse erythema in the gastric mucosa, but it was most evidence       in the antrum and proximal portion of the gastric body. Multiple cold       biopsies were obtained. Impression:               - Normal esophagus.                           - Non-bleeding gastric ulcers with a clean ulcer                            base (Forrest Class III). Biopsied.                           - Normal examined duodenum. Recommendation:           - Return patient to [facility] ward for ongoing care.                           - Resume regular diet.                           - Continue present medications.                           -  Await pathology results.                           - - Avoid NSAIDs, if possible.                           - If she needs treatment for her headaches a COX-2                            inhibitor is an option.                           - PPI QD.                           - Follow up in the office in 2 weeks.                           - Okay to D/C home.                           - Signing off. Procedure Code(s):        --- Professional ---                           [phone], Esophagogastroduodenoscopy, flexible,                            transoral; with biopsy, single or multiple Diagnosis Code(s):        --- Professional ---                           K25.9, Gastric  ulcer, unspecified as acute or                            chronic, without hemorrhage or perforation                           R10.13, Epigastric pain                           D50.9, Iron deficiency anemia, unspecified                           R19.5, Other fecal abnormalities CPT copyright 2022 American Medical Association. All rights reserved. The codes documented in this report are preliminary and upon coder review may  be revised to meet current compliance requirements. Jeani Hawking, MD Jeani Hawking, MD 06/01/2023 1:07:41 PM This report has been signed electronically. Number of Addenda: 0

## 2023-06-01 NOTE — Plan of Care (Signed)
  Problem: Coping: Goal: Ability to adjust to condition or change in health will improve Outcome: Progressing   Problem: Health Behavior/Discharge Planning: Goal: Ability to identify and utilize available resources and services will improve Outcome: Progressing   Problem: Metabolic: Goal: Ability to maintain appropriate glucose levels will improve Outcome: Progressing   Problem: Nutritional: Goal: Maintenance of adequate nutrition will improve Outcome: Progressing   Problem: Education: Goal: Knowledge of General Education information will improve Description: Including pain rating scale, medication(s)/side effects and non-pharmacologic comfort measures Outcome: Progressing   Problem: Coping: Goal: Level of anxiety will decrease Outcome: Progressing   Problem: Pain Managment: Goal: General experience of comfort will improve Outcome: Progressing

## 2023-06-01 NOTE — Progress Notes (Signed)
Progress Note   Patient: Heather Tanner ZOX:[id number] DOB: Feb 05, 1956 DOA: 05/31/2023     0 DOS: the patient was seen and examined on 06/01/2023   Subjective:  Patient seen and examined at bedside this morning Denies nausea vomiting abdominal pain Patient has not had any hematemesis or bloody stool today Patient has been planned for EGD by gastroenterologist today She continues to require Protonix drip    Brief hospital course: From HPI "67 year old African-American female history of type 2 diabetes on insulin, CKD stage IIIa baseline creatinine 1.1, hypertension, chronic headaches who presents to the ER from her PCP office.  Patient was looking pale at work.  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  She was seen by her PCP.  Noted to be orthostatic.  She had heme positive stools.  Patient sent to ER for evaluation. Patient does note that she has chronic headaches and she has had these for several years.  She has been taking Goody powders 1 packet 3 times a day for the last several months.  She started having some abdominal pain today. Sodium 133, potassium 4.9, bicarb 21, BUN of 32, creatinine 1.8, glucose 181 Total protein 8.6, albumin 4.4, AST 29, ALT 31, alk phos of 70" .    Assessment and Plan: GI bleeding Patient planned for EGD by GI today  Continue with protonix gtts. Heme + stool.  Follow-up on EGD findings Avoid anticoagulants Plan of care discussed with gastroenterologist Avoid NSAIDs   AKI (acute kidney injury) (HCC) Slightly worse than her baseline Scr of 1.1.   Acutely worse due to GI bleeding and NSAID use/abuse. Continue current IV fluid Monitor renal function closely   NSAID long-term use Pt has been using BC Goody's powders TID for several months due to chronic headaches.   Chronic headaches Pt will need to stop BC goody's powder.  Patient will need to follow-Neurologist as an outpatient concerning chronic headaches    hyperlipidemia Continue statin therapy    Essential hypertension Hold BP meds due to AKI. Monitor blood pressure closely   Type 2 diabetes mellitus with stage 3a chronic kidney disease, with long-term current use of insulin (HCC) Continue monitoring glucose level Continue current insulin therapy       DVT prophylaxis: Continue SCDs  Code Status: Full Code  Family Communication: no family at bedside   Disposition Plan: return home   Consults called: GI   Physical Exam: Vitals and nursing note reviewed.  General: Elderly female laying in bed in no distress HENT:     Head: Normocephalic and atraumatic.     Nose: Nose normal.  Eyes:     General: No scleral icterus. Cardiovascular: S1-S2 normal    Heart sounds: Normal heart sounds.  Pulmonary:     Effort: Pulmonary effort is normal. No respiratory distress.     Breath sounds: Normal breath sounds. No wheezing.  Abdominal:     General: Bowel sounds are normal. There is no distension.     Palpations: Abdomen is soft.     Tenderness: There is no abdominal tenderness. There is no guarding.  Musculoskeletal: No edema moves all extremities equally Neurological: Alert and oriented x 3 nonfocal  Vitals:   06/01/23 1310 06/01/23 1320 06/01/23 1330 06/01/23 1344  BP: 92/61 92/67 106/62 125/71  Pulse:  100 97 (!) 107  Resp:  16 16 19   Temp:    98 F (36.7 C)  TempSrc:    Oral  SpO2:  96% 96% 99%  Data Reviewed: I have reviewed gastroenterology documentation, admitting provider documentation ED physician documentation nursing documentation, previous charts, below mentioned CBC BMP as well as above vitals EKG reviewed showed normal sinus rhythm  Family Communication: No family present at bedside    Author: Loyce Dys, MD 06/01/2023 3:39 PM  For on call review [email or web address].

## 2023-06-01 NOTE — Transfer of Care (Signed)
Immediate Anesthesia Transfer of Care Note  Patient: Heather Tanner  Procedure(s) Performed: ESOPHAGOGASTRODUODENOSCOPY (EGD) WITH PROPOFOL BIOPSY  Patient Location: Endoscopy Unit  Anesthesia Type:MAC  Level of Consciousness: awake, drowsy, and patient cooperative  Airway & Oxygen Therapy: Patient Spontanous Breathing  Post-op Assessment: Report given to RN, Post -op Vital signs reviewed and stable, and Patient moving all extremities X 4  Post vital signs: Reviewed and stable  Last Vitals:  Vitals Value Taken Time  BP 93/52 06/01/23 1300  Temp    Pulse 104 06/01/23 1302  Resp 24 06/01/23 1302  SpO2 98 % 06/01/23 1302  Vitals shown include unfiled device data.  Last Pain:  Vitals:   06/01/23 1153  TempSrc: Temporal  PainSc: 9       Patients Stated Pain Goal: 3 (06/01/23 1153)  Complications: No notable events documented.

## 2023-06-01 NOTE — Progress Notes (Signed)
Patient is back on the floor after procedure via bed at 1340 PM. Alert and oriented.

## 2023-06-02 DIAGNOSIS — B9681 Helicobacter pylori [H. pylori] as the cause of diseases classified elsewhere: Secondary | ICD-10-CM

## 2023-06-02 DIAGNOSIS — N179 Acute kidney failure, unspecified: Secondary | ICD-10-CM | POA: Diagnosis not present

## 2023-06-02 DIAGNOSIS — T39395A Adverse effect of other nonsteroidal anti-inflammatory drugs [NSAID], initial encounter: Secondary | ICD-10-CM

## 2023-06-02 DIAGNOSIS — E860 Dehydration: Secondary | ICD-10-CM | POA: Diagnosis not present

## 2023-06-02 DIAGNOSIS — D500 Iron deficiency anemia secondary to blood loss (chronic): Secondary | ICD-10-CM | POA: Diagnosis not present

## 2023-06-02 DIAGNOSIS — K259 Gastric ulcer, unspecified as acute or chronic, without hemorrhage or perforation: Secondary | ICD-10-CM | POA: Diagnosis not present

## 2023-06-02 LAB — BASIC METABOLIC PANEL
Anion gap: 10 (ref 5–15)
BUN: 22 mg/dL (ref 8–23)
CO2: 23 mmol/L (ref 22–32)
Calcium: 8.9 mg/dL (ref 8.9–10.3)
Chloride: 102 mmol/L (ref 98–111)
Creatinine, Ser: 1.65 mg/dL — ABNORMAL HIGH (ref 0.44–1.00)
GFR, Estimated: 33 mL/min — ABNORMAL LOW (ref >60)
Glucose, Bld: 187 mg/dL — ABNORMAL HIGH (ref 70–99)
Potassium: 4.5 mmol/L (ref 3.5–5.1)
Sodium: 135 mmol/L (ref 135–145)

## 2023-06-02 LAB — GLUCOSE, CAPILLARY
Glucose-Capillary: 165 mg/dL — ABNORMAL HIGH (ref 70–99)
Glucose-Capillary: 177 mg/dL — ABNORMAL HIGH (ref 70–99)
Glucose-Capillary: 177 mg/dL — ABNORMAL HIGH (ref 70–99)
Glucose-Capillary: 225 mg/dL — ABNORMAL HIGH (ref 70–99)

## 2023-06-02 LAB — CBC
HCT: 27 % — ABNORMAL LOW (ref 36.0–46.0)
Hemoglobin: 8.3 g/dL — ABNORMAL LOW (ref 12.0–15.0)
MCH: 23.9 pg — ABNORMAL LOW (ref 26.0–34.0)
MCHC: 30.7 g/dL (ref 30.0–36.0)
MCV: 77.8 fL — ABNORMAL LOW (ref 80.0–100.0)
Platelets: 281 10*3/uL (ref 150–400)
RBC: 3.47 MIL/uL — ABNORMAL LOW (ref 3.87–5.11)
RDW: 16.6 % — ABNORMAL HIGH (ref 11.5–15.5)
WBC: 7.9 10*3/uL (ref 4.0–10.5)
nRBC: 0 % (ref 0.0–0.2)

## 2023-06-02 MED ORDER — PANTOPRAZOLE SODIUM 40 MG PO TBEC: 40.0000 mg | DELAYED_RELEASE_TABLET | Freq: Every day | ORAL | 1 refills | Status: DC

## 2023-06-02 MED ORDER — MELATONIN 10 MG PO TABS: 10.0000 mg | ORAL_TABLET | Freq: Every evening | ORAL | 0 refills | Status: DC | PRN

## 2023-06-02 NOTE — Progress Notes (Signed)
Transition of Care [facility]) - Inpatient Brief Assessment   Patient Details  Name: Heather Tanner MRN: [id number] Date of Birth: Sep 02, 1956  [facility] CM/SW Contact:    Janae Bridgeman, RN Phone Number: 06/02/2023, 9:43 AM   Clinical Narrative: Patient a admitted from home with GI bleeding.  No TOC needs at this time.   Transition of Care Asessment: Insurance and Status: (P) Insurance coverage has been reviewed Patient has primary care physician: (P) Yes Home environment has been reviewed: (P) Yes - from home Prior level of function:: (P) Independent Prior/Current Home Services: (P) No current home services Social Determinants of Health Reivew: (P) SDOH reviewed no interventions necessary Readmission risk has been reviewed: (P) Yes Transition of care needs: (P) no transition of care needs at this time

## 2023-06-02 NOTE — Inpatient Diabetes Management (Signed)
Inpatient Diabetes Program Recommendations  AACE/ADA: New Consensus Statement on Inpatient Glycemic Control (2015)  Target Ranges:  Prepandial:   less than 140 mg/dL      Peak postprandial:   less than 180 mg/dL (1-2 hours)      Critically ill patients:  140 - 180 mg/dL   Lab Results  Component Value Date   GLUCAP 165 (H) 06/02/2023   HGBA1C 9.7 (H) 04/22/2023    Diabetes history: DM2 Outpatient Diabetes medications: Toujeo 20 units every day, Janumet [phone] mg every day, Farxiga 10 mg QD Current orders for Inpatient glycemic control: Novolog 0-9 units Q4H  Reviewed patient's current A1c of 9.7% (average BG of 232 mg/dL) on 8/46/96.   Explained what a A1c is and what it measures. Also reviewed goal A1c with patient, importance of good glucose control @ home, and blood sugar goals.  She is current with her PCP.  Her PCP increased her Toujeo to 20 units from 16 units this past week.  Ms. Hollice Espy admits to drinking regular soda's.  Encouraged her to avoid caloric beverages.  She wears the [facility] G7 which is currently on the back of her left arm.  Current reading is 193 mg/dL.    She has occasional episodes of hypoglycemia. She states she does not feel it when she is low but her Dexcom alerts her.  So treats appropriately.    Encouraged her to work on getting her glucose trends down to help avoid long and short term complications of diabetes.  She verbalizes understanding.    Will continue to follow while inpatient.  Thank you, Dulce Sellar, MSN, CDCES Diabetes Coordinator Inpatient Diabetes Program [phone] (team pager from 8a-5p)

## 2023-06-02 NOTE — Discharge Summary (Signed)
Physician Discharge Summary   Patient: Heather Tanner MRN: [id number] DOB: 1956-04-30  Admit date:     05/31/2023  Discharge date: 06/02/23  Discharge Physician: Loyce Dys   PCP: Dorothyann Peng, MD   Recommendations at discharge:  Follow-up with gastroenterologist  Discharge Diagnoses: GI bleeding AKI (acute kidney injury) (HCC)  NSAID long-term use Chronic headaches hyperlipidemia Essential hypertension Type 2 diabetes mellitus with stage 3a chronic kidney disease, with long-term current use of insulin [facility])    Hospital Course: 67 year old African-American female history of type 2 diabetes on insulin, CKD stage IIIa baseline creatinine 1.1, hypertension, chronic headaches who presents to the ER from her PCP office.  Patient was looking pale at work.  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  She was seen by her PCP.  Noted to be orthostatic.  She had heme positive stools.  Patient sent to ER for evaluation. Patient does note that she has chronic headaches and she has had these for several years.  She has been taking Goody powders 1 packet 3 times a day for the last several months.  She started having some abdominal pain.  And therefore came in for evaluation.  Patient was seen by gastroenterologist and underwent upper GI endoscopy that showed nonbleeding gastric ulcers.  Patient has been cleared by GI for discharge and to continue on PPI therapy as well as outpatient follow-up.  Patient has been counseled to stop using any NSAIDs or Goody powders.   Consultants: Gastroenterologist Procedures performed: EGD Disposition: Home Diet recommendation:  Discharge Diet Orders (From admission, onward)     Start     Ordered   06/02/23 0000  Diet - low sodium heart healthy        06/02/23 0846           Cardiac diet DISCHARGE MEDICATION: Allergies as of 06/02/2023       Reactions   Latex         Medication List     STOP taking these medications     BABY ASPIRIN PO   famotidine 20 MG tablet Commonly known as: PEPCID   lisinopril 10 MG tablet Commonly known as: ZESTRIL   pravastatin 40 MG tablet Commonly known as: PRAVACHOL       TAKE these medications    Albuterol Sulfate 108 (90 Base) MCG/ACT Aepb Commonly known as: PROAIR RESPICLICK 2 puffs q6h prn   atorvastatin 40 MG tablet Commonly known as: LIPITOR TAKE 1 TABLET BY MOUTH ON MONDAY, WEDNESDAY, AND FRIDAY   cetirizine 10 MG tablet Commonly known as: ZyrTEC Allergy Take 1 tablet (10 mg total) by mouth daily.   co-enzyme Q-10 30 MG capsule Take 30 mg by mouth 3 (three) times daily.   dapagliflozin propanediol 10 MG Tabs tablet Commonly known as: Farxiga Take 1 tablet (10 mg total) by mouth daily before breakfast.   Dexcom G7 Sensor Misc Use as directed   DULoxetine 60 MG capsule Commonly known as: CYMBALTA TAKE ONE CAPSULE BY MOUTH DAILY   HYDROcodone-acetaminophen 10-325 MG tablet Commonly known as: NORCO Take 1 tablet by mouth 3 (three) times daily as needed.   Janumet XR [phone] MG Tb24 Generic drug: SitaGLIPtin-MetFORMIN HCl TAKE 1 TABLET BY MOUTH EVERY EVENING   Magic Mushroom Mix Caps Take by mouth.   Melatonin 10 MG Tabs Take 10 mg by mouth at bedtime as needed.   multivitamin tablet Take 1 tablet by mouth daily.   ONE TOUCH ULTRA MINI w/Device Kit Use  as instructed to check blood sugars 3 times per day dx: e11.22   OneTouch Delica Plus Lancet33G Misc USE TO CHECK BLOOD SUGAR THREE TIMES A DAY   OneTouch Ultra test strip Generic drug: glucose blood USE AS INSTRUCTED TO CHECK BLOOD SUGAR THREE TIMES A DAY   OVER THE COUNTER MEDICATION Total beet chew   pantoprazole 40 MG tablet Commonly known as: Protonix Take 1 tablet (40 mg total) by mouth daily.   Pen Needles 31G X 5 MM Misc Inject 1 each into the skin daily.   Toujeo SoloStar 300 UNIT/ML Solostar Pen Generic drug: insulin glargine (1 Unit Dial) Inject 20 units sq  nightly, max titration dose 60 units   Turmeric-Ginger 150-25 MG Chew Chew by mouth.   VIACTIV PO Take by mouth. 2 per day        Discharge Exam:  Vitals and nursing note reviewed.  General: Elderly female laying in bed in no distress HENT:     Head: Normocephalic and atraumatic.     Nose: Nose normal.  Eyes:     General: No scleral icterus. Cardiovascular: S1-S2 normal    Heart sounds: Normal heart sounds.  Pulmonary:     Effort: Pulmonary effort is normal. No respiratory distress.     Breath sounds: Normal breath sounds. No wheezing.  Abdominal:     General: Bowel sounds are normal. There is no distension.     Palpations: Abdomen is soft.     Tenderness: There is no abdominal tenderness. There is no guarding.  Musculoskeletal: No edema moves all extremities equally Neurological: Alert and oriented x 3 nonfocal  Condition at discharge: good   Discharge time spent:  34 minutes.  Signed: Loyce Dys, MD [facility] 06/02/2023

## 2023-06-03 ENCOUNTER — Telehealth: Payer: Self-pay

## 2023-06-03 NOTE — Transitions of Care (Post Inpatient/ED Visit) (Signed)
06/03/2023  Name: Heather Tanner MRN: [id number] DOB: 1956-01-16  Today's TOC FU Call Status: Today's TOC FU Call Status:: Successful TOC FU Call Completed TOC FU Call Complete Date: 06/03/23 Patient's Name and Date of Birth confirmed.  Transition Care Management Follow-up Telephone Call Date of Discharge: 06/02/23 Discharge Facility: [facility]) Type of Discharge: Inpatient Admission Primary Inpatient Discharge Diagnosis:: GI bleed How have you been since you were released from the hospital?: Better Any questions or concerns?: Yes Patient Questions/Concerns:: patient wants to know what to take for her headache. Patient Questions/Concerns Addressed: Notified Provider of Patient Questions/Concerns  Items Reviewed: Did you receive and understand the discharge instructions provided?: Yes Medications obtained,verified, and reconciled?: Yes (Medications Reviewed) Any new allergies since your discharge?: No Dietary orders reviewed?: No Do you have support at home?: Yes People in Home: child(ren), adult  Medications Reviewed Today: Medications Reviewed Today     Reviewed by Marlyn Corporal, CMA (Certified Medical Assistant) on 06/03/23 at 1230  Med List Status: <None>   Medication Order Taking? 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 2 per day [provider] Taking Active   cetirizine (ZYRTEC ALLERGY) 10 MG tablet [id number] Yes Take 1  tablet (10 mg total) by mouth daily. Dorothyann Peng, MD Taking Active   co-enzyme Q-10 30 MG capsule [id number] Yes Take 30 mg by mouth 3 (three) times daily. [provider] Taking Active   Continuous Glucose Sensor (DEXCOM G7 SENSOR) MISC [id number] Yes Use as directed Dorothyann Peng, MD Taking Active   dapagliflozin propanediol (FARXIGA) 10 MG TABS tablet [id number] Yes Take 1 tablet (10 mg total) by mouth daily before breakfast. Dorothyann Peng, MD Taking Active   DULoxetine (CYMBALTA) 60 MG capsule [id number] Yes TAKE ONE CAPSULE BY MOUTH DAILY Levert Feinstein, MD Taking Active   HYDROcodone-acetaminophen [facility]) 10-325 MG tablet [id number] Yes Take 1 tablet by mouth 3 (three) times daily as needed. [provider] Taking Active   insulin glargine, 1 Unit Dial, (TOUJEO SOLOSTAR) 300 UNIT/ML Solostar Pen [id number] Yes Inject 20 units sq nightly, max titration dose 60 units Dorothyann Peng, MD Taking Active   Insulin Pen Needle (PEN NEEDLES) 31G X 5 MM MISC [id number] Yes Inject 1 each into the skin daily. Dorothyann Peng, MD Taking Active   JANUMET XR [phone] MG TB24 [id number] Yes TAKE 1 TABLET BY MOUTH EVERY Josephine Igo, MD Taking Active   Lancets [facility] [place] [facility] [place]) [place] [id number] Yes USE TO CHECK BLOOD SUGAR THREE TIMES A Sharyon Medicus, MD Taking Active   melatonin 10 MG TABS [id number] Yes Take 10 mg by mouth at bedtime as needed. Loyce Dys, MD Taking Active   Misc Natural Products [facility] MUSHROOM MIX) CAPS [id number] Yes Take by mouth. [provider] Taking Active   Multiple Vitamin (MULTIVITAMIN) tablet [id number] Yes Take 1 tablet by mouth daily. [provider] Taking Active   [facility] ULTRA test strip [id number] Yes USE AS INSTRUCTED TO CHECK BLOOD SUGAR  THREE TIMES A Sharyon Medicus, MD Taking Active   OVER THE COUNTER MEDICATION [id number] Yes Total beet chew [provider] Taking Active   pantoprazole (PROTONIX) 40 MG  tablet [id number] Yes Take 1 tablet (40 mg total) by mouth daily. Loyce Dys, MD Taking Active   Turmeric-Ginger 150-25 MG CHEW [id number] Yes Chew by mouth. [provider] Taking Active             Home Care and Equipment/Supplies: Were Home Health Services Ordered?: No Any new equipment or medical supplies ordered?: No  Functional Questionnaire: Do you need assistance with bathing/showering or dressing?: No Do you need assistance with meal preparation?: No Do you need assistance with eating?: No Do you have difficulty maintaining continence: No Do you need assistance with getting out of bed/getting out of a chair/moving?: No Do you have difficulty managing or taking your medications?: No  Follow up appointments reviewed: PCP Follow-up appointment confirmed?: Yes Date of PCP follow-up appointment?: 06/07/23 Follow-up Provider: Melina Schools sanders Specialist [facility] Follow-up appointment confirmed?: Yes Date of Specialist follow-up appointment?: 06/15/23 Follow-Up Specialty Provider:: hung Do you need transportation to your follow-up appointment?: No Do you understand care options if your condition(s) worsen?: Yes-patient verbalized understanding    SIGNATURE Lisabeth Devoid, CMA

## 2023-06-06 ENCOUNTER — Encounter (HOSPITAL_COMMUNITY): Payer: Self-pay | Admitting: Gastroenterology

## 2023-06-07 ENCOUNTER — Encounter: Payer: Self-pay | Admitting: Internal Medicine

## 2023-06-07 ENCOUNTER — Ambulatory Visit: Payer: 59 | Admitting: Internal Medicine

## 2023-06-07 ENCOUNTER — Telehealth: Payer: Self-pay

## 2023-06-07 VITALS — BP 118/68 | Temp 98.2°F | Ht 64.0 in | Wt 158.6 lb

## 2023-06-07 DIAGNOSIS — I129 Hypertensive chronic kidney disease with stage 1 through stage 4 chronic kidney disease, or unspecified chronic kidney disease: Secondary | ICD-10-CM | POA: Diagnosis not present

## 2023-06-07 DIAGNOSIS — E785 Hyperlipidemia, unspecified: Secondary | ICD-10-CM

## 2023-06-07 DIAGNOSIS — Z794 Long term (current) use of insulin: Secondary | ICD-10-CM

## 2023-06-07 DIAGNOSIS — R519 Headache, unspecified: Secondary | ICD-10-CM

## 2023-06-07 DIAGNOSIS — K922 Gastrointestinal hemorrhage, unspecified: Secondary | ICD-10-CM

## 2023-06-07 DIAGNOSIS — N1831 Chronic kidney disease, stage 3a: Secondary | ICD-10-CM

## 2023-06-07 DIAGNOSIS — Z791 Long term (current) use of non-steroidal anti-inflammatories (NSAID): Secondary | ICD-10-CM

## 2023-06-07 DIAGNOSIS — N179 Acute kidney failure, unspecified: Secondary | ICD-10-CM | POA: Diagnosis not present

## 2023-06-07 DIAGNOSIS — F5101 Primary insomnia: Secondary | ICD-10-CM | POA: Insufficient documentation

## 2023-06-07 DIAGNOSIS — G8929 Other chronic pain: Secondary | ICD-10-CM

## 2023-06-07 DIAGNOSIS — I10 Essential (primary) hypertension: Secondary | ICD-10-CM

## 2023-06-07 MED ORDER — NURTEC 75 MG PO TBDP: ORAL_TABLET | ORAL | Status: DC

## 2023-06-07 NOTE — Progress Notes (Signed)
I,Heather Tanner, CMA,acting as a Neurosurgeon for [name], MD.,have documented all relevant documentation on the behalf of [name], MD,as directed by  [name], MD while in the presence of [name], MD.  Subjective:  Patient ID: Heather Tanner , female    DOB: 03-Jan-1956 , 67 y.o.   MRN: [id number]  Chief Complaint  Patient presents with   Hospitalization Follow-up    HPI  Patient presents today for hospital follow up. She was admitted to [facility] on 05/31/2023 for further evaluation of pale skin and fatigue. She was discharged in stable condition on 06/02/2023.   She was sent to ER from our office on 05/31/23 after her coworkers brought her to office for evaluation. They stated "she doesn't look right".  She was evaluated and found to be orthostatic. In the ER she admitted to using Goody powders 1-3 times per day for a long period of time for recurrent headaches.  She started having some abdominal pain on admission. On arrival, temp 90.3 heart rate 112 blood pressure 126/77 satting 99% on room air. Labs were significant for: white count 9.2, hemoglobin 11.1, platelets of 356.  Sodium 133, potassium 4.9, bicarb 21, BUN of 32, creatinine 1.8, glucose 181, total protein 8.6, albumin 4.4, AST 29, ALT 31, alk phos of 70. EKG was WNL. Upper endoscopy was performed and was significant for non-bleeding gastric ulcers.  Patient has been cleared by GI for discharge and to continue on PPI therapy as well as outpatient follow-up.  Patient was counseled to avoid use of any NSAIDs or Goody powders.  Today, she reports she is feeling much better. She has brought in all medications today.        Past Medical History:  Diagnosis Date   Arthritis    Carpal tunnel syndrome 01/01/2016   right   Diabetes mellitus without complication (HCC)    High cholesterol    Hypertension      Family History  Problem Relation Age of Onset   Diabetes Mother    Heart  Problems Mother    Hypertension Mother    Diabetes Father    Hypertension Father    Heart Problems Father    Hypertension Sister    Stroke Sister    Hyperlipidemia Sister    Cancer Sister        Adenocarcinoma   Heart Problems Brother    Healthy Son    Healthy Daughter    Healthy Daughter      Current Outpatient Medications:    Albuterol Sulfate (PROAIR RESPICLICK) 108 (90 Base) MCG/ACT AEPB, 2 puffs q6h prn, Disp: 1 each, Rfl: 3   Calcium-Vitamin D-Vitamin K (VIACTIV PO), Take by mouth. 2 per day, Disp: , Rfl:    cetirizine (ZYRTEC ALLERGY) 10 MG tablet, Take 1 tablet (10 mg total) by mouth daily., Disp: 90 tablet, Rfl: 1   co-enzyme Q-10 30 MG capsule, Take 30 mg by mouth 3 (three) times daily., Disp: , Rfl:    Continuous Glucose Sensor (DEXCOM G7 SENSOR) MISC, Use as directed, Disp: 3 each, Rfl: 11   dapagliflozin propanediol (FARXIGA) 10 MG TABS tablet, Take 1 tablet (10 mg total) by mouth daily before breakfast., Disp: 30 tablet, Rfl: 5   DULoxetine (CYMBALTA) 60 MG capsule, TAKE ONE CAPSULE BY MOUTH DAILY, Disp: 30 capsule, Rfl: 11   famotidine (PEPCID) 20 MG tablet, Take 20 mg by mouth 2 (two) times daily., Disp: , Rfl:    insulin glargine,  1 Unit Dial, (TOUJEO SOLOSTAR) 300 UNIT/ML Solostar Pen, Inject 20 units sq nightly, max titration dose 60 units, Disp: , Rfl:    Insulin Pen Needle (PEN NEEDLES) 31G X 5 MM MISC, Inject 1 each into the skin daily., Disp: 100 each, Rfl: 2   Iron-FA-B Cmp-C-Biot-Probiotic (FUSION PLUS) CAPS, Take 1 capsule by mouth daily., Disp: , Rfl:    JANUMET XR [phone] MG TB24, TAKE 1 TABLET BY MOUTH EVERY EVENING, Disp: 30 tablet, Rfl: 5   Lancets (ONETOUCH DELICA PLUS LANCET33G) MISC, USE TO CHECK BLOOD SUGAR THREE TIMES A DAY, Disp: 100 each, Rfl: 3   lisinopril (ZESTRIL) 10 MG tablet, Take 10 mg by mouth daily., Disp: , Rfl:    melatonin 10 MG TABS, Take 10 mg by mouth at bedtime as needed., Disp: 30 tablet, Rfl: 0   Multiple Vitamin (MULTIVITAMIN)  tablet, Take 1 tablet by mouth daily., Disp: , Rfl:    ONETOUCH ULTRA test strip, USE AS INSTRUCTED TO CHECK BLOOD SUGAR THREE TIMES A DAY, Disp: 100 strip, Rfl: 1   OVER THE COUNTER MEDICATION, Total beet chew, Disp: , Rfl:    pantoprazole (PROTONIX) 40 MG tablet, Take 1 tablet (40 mg total) by mouth daily., Disp: 30 tablet, Rfl: 1   pregabalin (LYRICA) 100 MG capsule, Take 100 mg by mouth 3 (three) times daily., Disp: , Rfl:    Rimegepant Sulfate (NURTEC) 75 MG TBDP, One tab po every day prn, Disp: , Rfl:    Turmeric-Ginger 150-25 MG CHEW, Chew by mouth., Disp: , Rfl:    atorvastatin (LIPITOR) 40 MG tablet, TAKE 1 TABLET BY MOUTH ON MONDAY, WEDNESDAY, AND FRIDAY (Patient not taking: Reported on 06/07/2023), Disp: 30 tablet, Rfl: 2   Blood Glucose Monitoring Suppl (ONE TOUCH ULTRA MINI) w/Device KIT, Use as instructed to check blood sugars 3 times per day 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 (Patient not taking: Reported on 06/07/2023), Disp: , Rfl:    Misc Natural Products (MAGIC MUSHROOM MIX) CAPS, Take by mouth. (Patient not taking: Reported on 06/07/2023), Disp: , Rfl:    Allergies  Allergen Reactions   Latex      Review of Systems  Constitutional: Negative.   Respiratory: Negative.    Cardiovascular: Negative.   Gastrointestinal: Negative.   Neurological: Negative.   Psychiatric/Behavioral: Negative.       Today's Vitals   06/07/23 1640  BP: 118/68  Temp: 98.2 F (36.8 C)  SpO2: 96%  Weight: 158 lb 9.6 oz (71.9 kg)  Height: 5\' 4"  (1.626 m)  PainSc: 0-No pain   Body mass index is 27.22 kg/m.  Wt Readings from Last 3 Encounters:  06/07/23 158 lb 9.6 oz (71.9 kg)  05/31/23 158 lb (71.7 kg)  05/30/23 158 lb 12.8 oz (72 kg)     Objective:  Physical Exam Vitals and nursing note reviewed.  Constitutional:      Appearance: Normal appearance.  HENT:     Head: Normocephalic  and atraumatic.  Eyes:     Extraocular Movements: Extraocular movements intact.  Cardiovascular:     Rate and Rhythm: Normal rate and regular rhythm.     Heart sounds: Normal heart sounds.  Pulmonary:     Effort: Pulmonary effort is normal.     Breath sounds: Normal breath sounds.  Musculoskeletal:     Cervical back: Normal range of motion.  Skin:    General:  Skin is warm.  Neurological:     General: No focal deficit present.     Mental Status: She is alert.  Psychiatric:        Mood and Affect: Mood normal.        Behavior: Behavior normal.         Assessment And Plan:  Gastrointestinal hemorrhage, unspecified gastrointestinal hemorrhage type Assessment & Plan: TCM PERFORMED. A MEMBER OF THE CLINICAL TEAM SPOKE WITH THE PATIENT UPON DISCHARGE. DISCHARGE SUMMARY WAS REVIEWED IN FULL DETAIL DURING THE VISIT. MEDS RECONCILED AND COMPARED TO DISCHARGE MEDS. MEDICATION LIST WAS UPDATED AND REVIEWED WITH THE PATIENT. GREATER THAN 50% FACE TO FACE TIME WAS SPENT IN COUNSELING AND COORDINATION OF CARE. ALL QUESTIONS WERE ANSWERED TO THE SATISFACTION OF THE PATIENT. She will continue with PPI therapy. Reminded to avoid use of Goody/BC powders. EGD results reviewed in detail. Encouraged to keep GI f/u appt.    Orders: -     CBC  AKI (acute kidney injury) (HCC) Assessment & Plan: She was given IV fluids during her hospitalization with improvement in her creatinine. I will recheck BMP today. She is encouraged to stay well hydrated.   Orders: -     BMP8+EGFR  Hypertensive nephropathy Assessment & Plan: Chronic,well controlled. No med changes today.    Chronic nonintractable headache, unspecified headache type Assessment & Plan: Chronic, she was given sample of Nurtec to try prn. If ineffective, will also try Ubrelvy. She was likely having rebound headaches due to persistent use of Goody powders. May need Neurology evaluation if persistent.    NSAID long-term use  Other  orders -     Nurtec; One tab po every day prn     Return if symptoms worsen or fail to improve.  Patient was given opportunity to ask questions. Patient verbalized understanding of the plan and was able to repeat key elements of the plan. All questions were answered to their satisfaction.    I, [name], MD, have reviewed all documentation for this visit. The documentation on 06/07/23 for the exam, diagnosis, procedures, and orders are all accurate and complete.   IF YOU HAVE BEEN REFERRED TO A SPECIALIST, IT MAY TAKE 1-2 WEEKS TO SCHEDULE/PROCESS THE REFERRAL. IF YOU HAVE NOT HEARD FROM US/SPECIALIST IN TWO WEEKS, PLEASE GIVE [place] A CALL AT [phone] X [phone].   THE PATIENT IS ENCOURAGED TO PRACTICE SOCIAL DISTANCING DUE TO THE COVID-19 PANDEMIC.

## 2023-06-07 NOTE — Telephone Encounter (Signed)
Transition Care Management Follow-up Telephone Call Date of discharge and from where: 06/02/23 [facility] How have you been since you were released from the hospital? ok Any questions or concerns? No  Items Reviewed: Did the pt receive and understand the discharge instructions provided? Yes  Medications obtained and verified? Yes  Other? No  Any new allergies since your discharge? No  Dietary orders reviewed? No Do you have support at home? Yes   Home Care and Equipment/Supplies: Were home health services ordered? no  Functional Questionnaire: (I = Independent and D = Dependent) ADLs: I  Bathing/Dressing- I  Meal Prep- I  Eating- I  Maintaining continence- I  Transferring/Ambulation- I  Managing Meds- I  Follow up appointments reviewed:  PCP Hospital f/u appt confirmed? Yes  Scheduled to see had appointment today Specialist Hospital f/u appt confirmed? No   Are transportation arrangements needed? No  If their condition worsens, is the pt aware to call PCP or go to the Emergency Dept.? Yes Was the patient provided with contact information for the PCP's office or ED? Yes Was to pt encouraged to call back with questions or concerns? Yes

## 2023-06-08 LAB — CBC
Hematocrit: 30.8 % — ABNORMAL LOW (ref 34.0–46.6)
Hemoglobin: 9.4 g/dL — ABNORMAL LOW (ref 11.1–15.9)
MCH: 24.5 pg — ABNORMAL LOW (ref 26.6–33.0)
MCHC: 30.5 g/dL — ABNORMAL LOW (ref 31.5–35.7)
MCV: 80 fL (ref 79–97)
Platelets: 370 10*3/uL (ref 150–450)
RBC: 3.83 x10E6/uL (ref 3.77–5.28)
RDW: 15.6 % — ABNORMAL HIGH (ref 11.7–15.4)
WBC: 7 10*3/uL (ref 3.4–10.8)

## 2023-06-08 LAB — BMP8+EGFR
BUN/Creatinine Ratio: 13 (ref 12–28)
BUN: 18 mg/dL (ref 8–27)
CO2: 25 mmol/L (ref 20–29)
Calcium: 10.1 mg/dL (ref 8.7–10.3)
Chloride: 99 mmol/L (ref 96–106)
Creatinine, Ser: 1.39 mg/dL — ABNORMAL HIGH (ref 0.57–1.00)
Glucose: 247 mg/dL — ABNORMAL HIGH (ref 70–99)
Potassium: 5 mmol/L (ref 3.5–5.2)
Sodium: 142 mmol/L (ref 134–144)
eGFR: 41 mL/min/{1.73_m2} — ABNORMAL LOW (ref >59)

## 2023-06-09 ENCOUNTER — Encounter: Payer: Self-pay | Admitting: Internal Medicine

## 2023-06-09 LAB — SURGICAL PATHOLOGY

## 2023-06-10 NOTE — Telephone Encounter (Signed)
Patient was called- patient reported her stool has not been black but it is now black. Patient was encouraged to go to the hospital for further evaluation.

## 2023-06-12 NOTE — Assessment & Plan Note (Signed)
Chronic, well controlled. No med changes today.

## 2023-06-12 NOTE — Assessment & Plan Note (Signed)
DRE performed, stool is guaiac positive, brown in color.  I will refer her to GI for further evaluation. She is in agreement with treatment plan.

## 2023-06-12 NOTE — Assessment & Plan Note (Signed)
Referral placed for GI evaluation.

## 2023-06-12 NOTE — Assessment & Plan Note (Signed)
Likely related to anemia. EKG performed, NSR w/o acute changes. I will check iron studies today as well.

## 2023-06-17 ENCOUNTER — Encounter: Payer: Self-pay | Admitting: Internal Medicine

## 2023-06-24 ENCOUNTER — Other Ambulatory Visit: Payer: Self-pay

## 2023-06-26 NOTE — Assessment & Plan Note (Signed)
She was given IV fluids during her hospitalization with improvement in her creatinine. I will recheck BMP today. She is encouraged to stay well hydrated.

## 2023-06-26 NOTE — Assessment & Plan Note (Signed)
Chronic, well controlled. No med changes today.

## 2023-06-26 NOTE — Assessment & Plan Note (Signed)
TCM PERFORMED. A MEMBER OF THE CLINICAL TEAM SPOKE WITH THE PATIENT UPON DISCHARGE. DISCHARGE SUMMARY WAS REVIEWED IN FULL DETAIL DURING THE VISIT. MEDS RECONCILED AND COMPARED TO DISCHARGE MEDS. MEDICATION LIST WAS UPDATED AND REVIEWED WITH THE PATIENT. GREATER THAN 50% FACE TO FACE TIME WAS SPENT IN COUNSELING AND COORDINATION OF CARE. ALL QUESTIONS WERE ANSWERED TO THE SATISFACTION OF THE PATIENT. She will continue with PPI therapy. Reminded to avoid use of Goody/BC powders. EGD results reviewed in detail. Encouraged to keep GI f/u appt.

## 2023-06-26 NOTE — Assessment & Plan Note (Signed)
Chronic, she was given sample of Nurtec to try prn. If ineffective, will also try Ubrelvy. She was likely having rebound headaches due to persistent use of Goody powders. May need Neurology evaluation if persistent.

## 2023-07-04 ENCOUNTER — Other Ambulatory Visit: Payer: Self-pay

## 2023-07-04 MED ORDER — FUSION PLUS PO CAPS: 1.0000 | ORAL_CAPSULE | Freq: Every day | ORAL | 3 refills | Status: DC

## 2023-07-04 MED ORDER — NURTEC 75 MG PO TBDP: ORAL_TABLET | ORAL | 2 refills | Status: DC

## 2023-07-11 ENCOUNTER — Other Ambulatory Visit: Payer: Self-pay | Admitting: Internal Medicine

## 2023-07-18 ENCOUNTER — Other Ambulatory Visit: Payer: Self-pay | Admitting: Internal Medicine

## 2023-07-25 ENCOUNTER — Telehealth: Payer: Self-pay

## 2023-07-25 ENCOUNTER — Encounter: Payer: Self-pay | Admitting: Internal Medicine

## 2023-07-25 ENCOUNTER — Ambulatory Visit: Payer: 59 | Admitting: Internal Medicine

## 2023-07-25 VITALS — BP 134/82 | HR 84 | Temp 98.4°F | Ht 64.0 in | Wt 164.2 lb

## 2023-07-25 DIAGNOSIS — G8929 Other chronic pain: Secondary | ICD-10-CM

## 2023-07-25 DIAGNOSIS — E2839 Other primary ovarian failure: Secondary | ICD-10-CM

## 2023-07-25 DIAGNOSIS — D500 Iron deficiency anemia secondary to blood loss (chronic): Secondary | ICD-10-CM

## 2023-07-25 DIAGNOSIS — E1122 Type 2 diabetes mellitus with diabetic chronic kidney disease: Secondary | ICD-10-CM | POA: Diagnosis not present

## 2023-07-25 DIAGNOSIS — N1831 Chronic kidney disease, stage 3a: Secondary | ICD-10-CM | POA: Diagnosis not present

## 2023-07-25 DIAGNOSIS — R519 Headache, unspecified: Secondary | ICD-10-CM

## 2023-07-25 DIAGNOSIS — Z794 Long term (current) use of insulin: Secondary | ICD-10-CM

## 2023-07-25 MED ORDER — EMPAGLIFLOZIN 25 MG PO TABS: 25.0000 mg | ORAL_TABLET | Freq: Every day | ORAL | 5 refills | Status: DC

## 2023-07-25 MED ORDER — UBRELVY 50 MG PO TABS: ORAL_TABLET | ORAL | Status: DC

## 2023-07-25 NOTE — Progress Notes (Signed)
I,[name], CMA,acting as a Neurosurgeon for [name], MD.,have documented all relevant documentation on the behalf of [name], MD,as directed by  [name], MD while in the presence of [name], MD.  Subjective:  Patient ID: Heather Tanner , female    DOB: [birth year] , 67 y.o.   MRN: [id number]  Chief Complaint  Patient presents with   Diabetes    HPI  Patient presents today for Farxiga follow up. She was started on Farxiga 10mg  daily after her last A1c came back greater than 9%. She reports taking her last sample of Farxiga 2 weeks ago. She states her insurance did not approve the medication. She states her sugars are still in the 200s. She admits she has not contacted the office to notify us of these readings. She reports she has made several dietary changes and has cut out sodas.   Diabetes She presents for her follow-up diabetic visit. She has type 2 diabetes mellitus. Hypoglycemia symptoms include headaches. Pertinent negatives for diabetes include no blurred vision and no chest pain. There are no hypoglycemic complications. Diabetic complications include peripheral neuropathy and retinopathy. Risk factors for coronary artery disease include diabetes mellitus, dyslipidemia, hypertension, post-menopausal and sedentary lifestyle. She participates in exercise intermittently. An ACE inhibitor/angiotensin II receptor blocker is being taken.  Hypertension This is a chronic problem. The current episode started more than 1 year ago. The problem has been gradually improving since onset. The problem is controlled. Associated symptoms include headaches. Pertinent negatives include no blurred vision or chest pain. Past treatments include ACE inhibitors. Compliance problems include exercise.  Hypertensive end-organ damage includes retinopathy.     Past Medical History:  Diagnosis Date   Arthritis    Carpal tunnel syndrome 01/01/2016   right   Diabetes mellitus  without complication (HCC)    High cholesterol    Hypertension      Family History  Problem Relation Age of Onset   Diabetes Mother    Heart Problems Mother    Hypertension Mother    Diabetes Father    Hypertension Father    Heart Problems Father    Hypertension Sister    Stroke Sister    Hyperlipidemia Sister    Cancer Sister        Adenocarcinoma   Heart Problems Brother    Healthy Son    Healthy Daughter    Healthy Daughter      Current Outpatient Medications:    Albuterol Sulfate (PROAIR RESPICLICK) 108 (90 Base) MCG/ACT AEPB, 2 puffs q6h prn, Disp: 1 each, Rfl: 3   Calcium-Vitamin D-Vitamin K (VIACTIV PO), Take by mouth. 2 per day, Disp: , Rfl:    Continuous Glucose Sensor (DEXCOM G7 SENSOR) MISC, Use as directed, Disp: 3 each, Rfl: 11   DULoxetine (CYMBALTA) 60 MG capsule, TAKE ONE CAPSULE BY MOUTH DAILY, Disp: 30 capsule, Rfl: 11   empagliflozin (JARDIANCE) 25 MG TABS tablet, Take 1 tablet (25 mg total) by mouth daily before breakfast., Disp: 30 tablet, Rfl: 5   insulin glargine, 1 Unit Dial, (TOUJEO SOLOSTAR) 300 UNIT/ML Solostar Pen, Inject 20 units sq nightly, max titration dose 60 units, Disp: , Rfl:    Insulin Pen Needle (PEN NEEDLES) 31G X 5 MM MISC, Inject 1 each into the skin daily., Disp: 100 each, Rfl: 2   Iron-FA-B Cmp-C-Biot-Probiotic (FUSION PLUS) CAPS, Take 1 capsule by mouth daily., Disp: 30 capsule, Rfl: 3   lisinopril (ZESTRIL) 10 MG tablet, Take 10 mg  by mouth daily., Disp: , Rfl:    Multiple Vitamin (MULTIVITAMIN) tablet, Take 1 tablet by mouth daily., Disp: , Rfl:    OVER THE COUNTER MEDICATION, Total beet chew, Disp: , Rfl:    pantoprazole (PROTONIX) 40 MG tablet, Take 1 tablet (40 mg total) by mouth daily., Disp: 30 tablet, Rfl: 1   Turmeric-Ginger 150-25 MG CHEW, Chew by mouth., Disp: , Rfl:    Ubrogepant (UBRELVY) 50 MG TABS, One tab po today, repeat in 2 hours if needed, Disp: , Rfl:    HYDROcodone-acetaminophen (NORCO) 10-325 MG tablet, Take 1  tablet by mouth 3 (three) times daily as needed. (Patient not taking: Reported on 06/07/2023), Disp: , Rfl:    Misc Natural Products (MAGIC MUSHROOM MIX) CAPS, Take by mouth. (Patient not taking: Reported on 06/07/2023), Disp: , Rfl:    Allergies  Allergen Reactions   Latex      Review of Systems  Constitutional: Negative.   Eyes:  Negative for blurred vision.  Respiratory: Negative.    Cardiovascular: Negative.  Negative for chest pain.  Gastrointestinal: Negative.   Neurological:  Positive for headaches.  Psychiatric/Behavioral: Negative.    c   Today's Vitals   07/25/23 1449  BP: 134/82  Pulse: 84  Temp: 98.4 F (36.9 C)  SpO2: 98%  Weight: 164 lb 3.2 oz (74.5 kg)  Height: 5\' 4"  (1.626 m)   Body mass index is 28.18 kg/m.  Wt Readings from Last 3 Encounters:  07/25/23 164 lb 3.2 oz (74.5 kg)  06/07/23 158 lb 9.6 oz (71.9 kg)  05/31/23 158 lb (71.7 kg)     Objective:  Physical Exam Vitals and nursing note reviewed.  Constitutional:      Appearance: Normal appearance.  HENT:     Head: Normocephalic and atraumatic.  Eyes:     Extraocular Movements: Extraocular movements intact.  Cardiovascular:     Rate and Rhythm: Normal rate and regular rhythm.     Heart sounds: Murmur heard.  Pulmonary:     Effort: Pulmonary effort is normal.     Breath sounds: Normal breath sounds.  Musculoskeletal:     Cervical back: Normal range of motion.  Skin:    General: Skin is warm.  Neurological:     General: No focal deficit present.     Mental Status: She is alert.  Psychiatric:        Mood and Affect: Mood normal.        Behavior: Behavior normal.         Assessment And Plan:  Type 2 diabetes mellitus with stage 3a chronic kidney disease, with long-term current use of insulin (HCC) Assessment & Plan: Chronic, importance of improved glycemic control was stressed to the patient. I advised her that she already has a complication, CKD.  I will add Ozempic, 0.25mg  weekly. She  denies personal/family history of thyroid cancer. Advised to stop eating when full. Also, since Marcelline Deist is not covered, will send Jardiance 25mg  daily to the pharmacy. She will f/u in four weeks. Reminded to stay well hydrated while on this medication.   Orders: -     Hemoglobin A1c -     BMP8+EGFR  Estrogen deficiency -     DG Bone Density; Future  Iron deficiency anemia due to chronic blood loss Assessment & Plan: I will check f/u CBC today. She reports compliance with [facility]. She has GI f/u tomorrow.    Chronic nonintractable headache, unspecified headache type Assessment & Plan: Sx are suggestive  of migraines. She was given sample of Ubrelvy, 50mg  to take daily prn. Advised that she may take repeat dose in 2 hours if needed.    Other orders -     Empagliflozin; Take 1 tablet (25 mg total) by mouth daily before breakfast.  Dispense: 30 tablet; Refill: 5 -     Ubrelvy; One tab po today, repeat in 2 hours if needed     Return in 4 weeks (on 08/22/2023), or ozempic f/u and Jardiance f/u, for 3 month dm check.  Patient was given opportunity to ask questions. Patient verbalized understanding of the plan and was able to repeat key elements of the plan. All questions were answered to their satisfaction.    I, [name], MD, have reviewed all documentation for this visit. The documentation on 07/25/23 for the exam, diagnosis, procedures, and orders are all accurate and complete.   IF YOU HAVE BEEN REFERRED TO A SPECIALIST, IT MAY TAKE 1-2 WEEKS TO SCHEDULE/PROCESS THE REFERRAL. IF YOU HAVE NOT HEARD FROM US/SPECIALIST IN TWO WEEKS, PLEASE GIVE [place] A CALL AT [phone].   THE PATIENT IS ENCOURAGED TO PRACTICE SOCIAL DISTANCING DUE TO THE COVID-19 PANDEMIC.

## 2023-07-25 NOTE — Telephone Encounter (Signed)
Prior Auth for Jardiance 25mg  has been submitted through Covermymeds. We are waiting for the determination. YL,RMA

## 2023-07-25 NOTE — Assessment & Plan Note (Signed)
Sx are suggestive of migraines. She was given sample of Ubrelvy, 50mg  to take daily prn. Advised that she may take repeat dose in 2 hours if needed.

## 2023-07-25 NOTE — Patient Instructions (Signed)

## 2023-07-25 NOTE — Assessment & Plan Note (Signed)
Chronic, importance of improved glycemic control was stressed to the patient. I advised her that she already has a complication, CKD.  I will add Ozempic, 0.25mg  weekly. She denies personal/family history of thyroid cancer. Advised to stop eating when full. Also, since Marcelline Deist is not covered, will send Jardiance 25mg  daily to the pharmacy. She will f/u in four weeks. Reminded to stay well hydrated while on this medication.

## 2023-07-25 NOTE — Assessment & Plan Note (Signed)
I will check f/u CBC today. She reports compliance with Fusion Plus. She has GI f/u tomorrow.

## 2023-07-26 LAB — BMP8+EGFR
BUN/Creatinine Ratio: 12 (ref 12–28)
BUN: 12 mg/dL (ref 8–27)
CO2: 23 mmol/L (ref 20–29)
Calcium: 9.1 mg/dL (ref 8.7–10.3)
Chloride: 100 mmol/L (ref 96–106)
Creatinine, Ser: 0.98 mg/dL (ref 0.57–1.00)
Glucose: 268 mg/dL — ABNORMAL HIGH (ref 70–99)
Potassium: 4.2 mmol/L (ref 3.5–5.2)
Sodium: 139 mmol/L (ref 134–144)
eGFR: 62 mL/min/{1.73_m2} (ref >59)

## 2023-07-26 LAB — HEMOGLOBIN A1C
Est. average glucose Bld gHb Est-mCnc: 246 mg/dL
Hgb A1c MFr Bld: 10.2 % — ABNORMAL HIGH (ref 4.8–5.6)

## 2023-07-30 ENCOUNTER — Other Ambulatory Visit: Payer: Self-pay | Admitting: Internal Medicine

## 2023-08-02 ENCOUNTER — Other Ambulatory Visit: Payer: Self-pay | Admitting: Internal Medicine

## 2023-08-02 DIAGNOSIS — Z1231 Encounter for screening mammogram for malignant neoplasm of breast: Secondary | ICD-10-CM

## 2023-08-05 ENCOUNTER — Other Ambulatory Visit: Payer: Self-pay

## 2023-08-05 MED ORDER — LISINOPRIL 10 MG PO TABS: 10.0000 mg | ORAL_TABLET | Freq: Every day | ORAL | 2 refills | Status: DC

## 2023-08-22 ENCOUNTER — Ambulatory Visit: Payer: 59 | Admitting: Internal Medicine

## 2023-08-22 ENCOUNTER — Encounter: Payer: Self-pay | Admitting: Internal Medicine

## 2023-08-22 VITALS — BP 110/76 | HR 96 | Temp 98.0°F | Ht 64.0 in | Wt 149.8 lb

## 2023-08-22 DIAGNOSIS — K219 Gastro-esophageal reflux disease without esophagitis: Secondary | ICD-10-CM

## 2023-08-22 DIAGNOSIS — E1122 Type 2 diabetes mellitus with diabetic chronic kidney disease: Secondary | ICD-10-CM

## 2023-08-22 DIAGNOSIS — N1831 Chronic kidney disease, stage 3a: Secondary | ICD-10-CM | POA: Diagnosis not present

## 2023-08-22 DIAGNOSIS — G43709 Chronic migraine without aura, not intractable, without status migrainosus: Secondary | ICD-10-CM | POA: Diagnosis not present

## 2023-08-22 DIAGNOSIS — Z794 Long term (current) use of insulin: Secondary | ICD-10-CM

## 2023-08-22 MED ORDER — PANTOPRAZOLE SODIUM 40 MG PO TBEC: 40.0000 mg | DELAYED_RELEASE_TABLET | Freq: Every day | ORAL | 1 refills | Status: DC

## 2023-08-22 MED ORDER — UBRELVY 100 MG PO TABS: ORAL_TABLET | ORAL | 0 refills | Status: DC

## 2023-08-22 MED ORDER — TOUJEO SOLOSTAR 300 UNIT/ML ~~LOC~~ SOPN: PEN_INJECTOR | SUBCUTANEOUS | Status: DC

## 2023-08-22 NOTE — Patient Instructions (Signed)

## 2023-08-22 NOTE — Progress Notes (Unsigned)
I,[name], CMA,acting as a Neurosurgeon for [name], MD.,have documented all relevant documentation on the behalf of [name], MD,as directed by  [name], MD while in the presence of [name], MD.  Subjective:  Patient ID: Heather Tanner , female    DOB: [birth year] , 67 y.o.   MRN: [id number]  Chief Complaint  Patient presents with   Diabetes    HPI  Patient presents today for Ozempic & Jardiance follow up. She reports compliance with medications. Denies headache, chest pain & sob. She reports no specific questions or concerns. She states taking Ozempic on Tuesdays.   Diabetes She presents for her follow-up diabetic visit. She has type 2 diabetes mellitus. Pertinent negatives for diabetes include no blurred vision and no chest pain. There are no hypoglycemic complications. Diabetic complications include peripheral neuropathy and retinopathy. Risk factors for coronary artery disease include diabetes mellitus, dyslipidemia, hypertension, post-menopausal and sedentary lifestyle. She participates in exercise intermittently. An ACE inhibitor/angiotensin II receptor blocker is being taken.  Hypertension This is a chronic problem. The current episode started more than 1 year ago. The problem has been gradually improving since onset. The problem is controlled. Pertinent negatives include no blurred vision or chest pain. Past treatments include ACE inhibitors. Compliance problems include exercise.  Hypertensive end-organ damage includes retinopathy.     Past Medical History:  Diagnosis Date   Arthritis    Carpal tunnel syndrome 01/01/2016   right   Diabetes mellitus without complication (HCC)    High cholesterol    Hypertension      Family History  Problem Relation Age of Onset   Diabetes Mother    Heart Problems Mother    Hypertension Mother    Diabetes Father    Hypertension Father    Heart Problems Father    Hypertension Sister    Stroke Sister     Hyperlipidemia Sister    Cancer Sister        Adenocarcinoma   Heart Problems Brother    Healthy Son    Healthy Daughter    Healthy Daughter      Current Outpatient Medications:    Albuterol Sulfate (PROAIR RESPICLICK) 108 (90 Base) MCG/ACT AEPB, 2 puffs q6h prn, Disp: 1 each, Rfl: 3   Calcium-Vitamin D-Vitamin K (VIACTIV PO), Take by mouth. 2 per day, Disp: , Rfl:    Continuous Glucose Sensor (DEXCOM G7 SENSOR) MISC, Use as directed, Disp: 3 each, Rfl: 11   DULoxetine (CYMBALTA) 60 MG capsule, TAKE ONE CAPSULE BY MOUTH DAILY, Disp: 30 capsule, Rfl: 11   empagliflozin (JARDIANCE) 25 MG TABS tablet, Take 1 tablet (25 mg total) by mouth daily before breakfast., Disp: 30 tablet, Rfl: 5   Insulin Pen Needle (PEN NEEDLES) 31G X 5 MM MISC, Inject 1 each into the skin daily., Disp: 100 each, Rfl: 2   Iron-FA-B Cmp-C-Biot-Probiotic (FUSION PLUS) CAPS, Take 1 capsule by mouth daily., Disp: 30 capsule, Rfl: 3   lisinopril (ZESTRIL) 10 MG tablet, Take 1 tablet (10 mg total) by mouth daily., Disp: 90 tablet, Rfl: 2   Multiple Vitamin (MULTIVITAMIN) tablet, Take 1 tablet by mouth daily., Disp: , Rfl:    OVER THE COUNTER MEDICATION, Total beet chew, Disp: , Rfl:    Turmeric-Ginger 150-25 MG CHEW, Chew by mouth., Disp: , Rfl:    Ubrogepant (UBRELVY) 100 MG TABS, One tab po every day prn, Disp: 12 tablet, Rfl: 0   HYDROcodone-acetaminophen (NORCO) 10-325 MG tablet, Take 1 tablet  by mouth 3 (three) times daily as needed. (Patient not taking: Reported on 06/07/2023), Disp: , Rfl:    insulin glargine, 1 Unit Dial, (TOUJEO SOLOSTAR) 300 UNIT/ML Solostar Pen, Inject 24 units sq nightly, max titration dose 60 units, Disp: , Rfl:    pantoprazole (PROTONIX) 40 MG tablet, Take 1 tablet (40 mg total) by mouth daily., Disp: 90 tablet, Rfl: 1   Allergies  Allergen Reactions   Latex      Review of Systems  Constitutional: Negative.   Eyes:  Negative for blurred vision.  Respiratory: Negative.     Cardiovascular: Negative.  Negative for chest pain.  Gastrointestinal:        Pos indigestion  Psychiatric/Behavioral: Negative.       Today's Vitals   08/22/23 1130  BP: 110/76  Pulse: 96  Temp: 98 F (36.7 C)  SpO2: 98%  Weight: 149 lb 12.8 oz (67.9 kg)  Height: 5\' 4"  (1.626 m)   Body mass index is 25.71 kg/m.  Wt Readings from Last 3 Encounters:  08/22/23 149 lb 12.8 oz (67.9 kg)  07/25/23 164 lb 3.2 oz (74.5 kg)  06/07/23 158 lb 9.6 oz (71.9 kg)     Objective:  Physical Exam      Assessment And Plan:  Type 2 diabetes mellitus with stage 3a chronic kidney disease, with long-term current use of insulin (HCC) -     Toujeo SoloStar; Inject 24 units sq nightly, max titration dose 60 units -     BMP8+eGFR  Chronic migraine without aura without status migrainosus, not intractable -     Ambulatory referral to Neurology  Gastroesophageal reflux disease without esophagitis  Type 2 diabetes mellitus with stage 3a chronic kidney disease, with long-term current use of insulin (HCC) -     Toujeo SoloStar; Inject 24 units sq nightly, max titration dose 60 units -     BMP8+eGFR  Other orders -     Bernita Raisin; One tab po every day prn  Dispense: 12 tablet; Refill: 0 -     Pantoprazole Sodium; Take 1 tablet (40 mg total) by mouth daily.  Dispense: 90 tablet; Refill: 1     Return in 5 weeks (on 09/26/2023), or lab - a1c ONLY, for 3 month dm check.  Patient was given opportunity to ask questions. Patient verbalized understanding of the plan and was able to repeat key elements of the plan. All questions were answered to their satisfaction.    I, [name], MD, have reviewed all documentation for this visit. The documentation on 08/22/23 for the exam, diagnosis, procedures, and orders are all accurate and complete.   IF YOU HAVE BEEN REFERRED TO A SPECIALIST, IT MAY TAKE 1-2 WEEKS TO SCHEDULE/PROCESS THE REFERRAL. IF YOU HAVE NOT HEARD FROM US/SPECIALIST IN TWO WEEKS, PLEASE  GIVE [place] A CALL AT [phone] X [phone].   THE PATIENT IS ENCOURAGED TO PRACTICE SOCIAL DISTANCING DUE TO THE COVID-19 PANDEMIC.

## 2023-08-23 ENCOUNTER — Other Ambulatory Visit: Payer: Self-pay | Admitting: Internal Medicine

## 2023-08-23 DIAGNOSIS — Z794 Long term (current) use of insulin: Secondary | ICD-10-CM

## 2023-08-23 DIAGNOSIS — K219 Gastro-esophageal reflux disease without esophagitis: Secondary | ICD-10-CM | POA: Insufficient documentation

## 2023-08-23 LAB — BMP8+EGFR
BUN/Creatinine Ratio: 16 (ref 12–28)
BUN: 19 mg/dL (ref 8–27)
CO2: 25 mmol/L (ref 20–29)
Calcium: 10.2 mg/dL (ref 8.7–10.3)
Chloride: 102 mmol/L (ref 96–106)
Creatinine, Ser: 1.21 mg/dL — ABNORMAL HIGH (ref 0.57–1.00)
Glucose: 108 mg/dL — ABNORMAL HIGH (ref 70–99)
Potassium: 4.6 mmol/L (ref 3.5–5.2)
Sodium: 145 mmol/L — ABNORMAL HIGH (ref 134–144)
eGFR: 49 mL/min/{1.73_m2} — ABNORMAL LOW (ref >59)

## 2023-08-23 NOTE — Assessment & Plan Note (Signed)
I will start pantoprazole. Her sx of indigestion likely exacerbated by Ozempic. Will see if we can stop this after 90 days. Reminded to stop eating 3 hours prior to lying down.

## 2023-08-23 NOTE — Assessment & Plan Note (Signed)
Chronic, she was given samples of Ubrelvy 100mg  to use daily as needed. Due to persistence of symptoms, I will refer her to Neurology for further evaluation. Since was taking OTC meds for so long, it is likely there is a component of rebound headaches as well. Due to persistence of symptoms, she may benefit from [place] - a daily medication.

## 2023-08-23 NOTE — Assessment & Plan Note (Signed)
Chronic, I will check labs as below. I will send refill of Toujeo 24 units nightly. She will continue with both Jardiance and Ozempic as well. She will f/u in 3-4 months.

## 2023-08-25 ENCOUNTER — Ambulatory Visit: Payer: 59 | Admitting: Neurology

## 2023-08-25 ENCOUNTER — Encounter: Payer: Self-pay | Admitting: Neurology

## 2023-08-25 VITALS — BP 119/81 | HR 91 | Ht 64.0 in | Wt 149.0 lb

## 2023-08-25 DIAGNOSIS — G43E19 Chronic migraine with aura, intractable, without status migrainosus: Secondary | ICD-10-CM | POA: Diagnosis not present

## 2023-08-25 DIAGNOSIS — R202 Paresthesia of skin: Secondary | ICD-10-CM

## 2023-08-25 MED ORDER — UBRELVY 100 MG PO TABS: ORAL_TABLET | ORAL | 11 refills | Status: DC

## 2023-08-25 MED ORDER — TIZANIDINE HCL 4 MG PO TABS: 4.0000 mg | ORAL_TABLET | Freq: Four times a day (QID) | ORAL | 6 refills | Status: DC | PRN

## 2023-08-25 MED ORDER — DIVALPROEX SODIUM ER 500 MG PO TB24: 500.0000 mg | ORAL_TABLET | Freq: Every evening | ORAL | 11 refills | Status: AC

## 2023-08-25 MED ORDER — ONDANSETRON HCL 4 MG PO TABS: 4.0000 mg | ORAL_TABLET | Freq: Three times a day (TID) | ORAL | 6 refills | Status: DC | PRN

## 2023-08-25 NOTE — Progress Notes (Signed)
ASSESSMENT AND PLAN  Ronnae Osmond is a 67 y.o. female    Diffuse body achy pain  Normal EMG nerve conduction study in December 2022, no evidence of large fiber peripheral neuropathy,  CPK, C-reactive protein, ESR showed no significant abnormality, no evidence of polymyalgia rheumatica  Symptoms much improved with Cymbalta 60 mg daily Persistent daily headaches  MRI brain to rule out structural abnormality  History of migraine, develop medication overuse headache but more than 40 years history of daily multiple dose BC powder use, gastric ulcer,  Add on Depakote ER 500 mg every night as migraine prevention  Ubrelvy as needed, may combine with tizanidine Zofran for severe prolonged headache  Return To Clinic With NP In 4 Months  DIAGNOSTIC DATA (LABS, IMAGING, TESTING) - I reviewed patient records, labs, notes, testing and imaging myself where available.   MEDICAL HISTORY:  Lyah Dewalt is a 67 year old female,, seen in request by her primary care physician Dr. Allyne Gee, [facility], for evaluation of intermittent hands and feet paresthesia, diffuse body achy pain, was seen by Dr. Anne Hahn, retired neurologist in September 2022, who ordered EMG nerve conduction study.  This is the first time I see her at the clinic.  I reviewed and summarized the referring note.PMHx: HLD HTN DM  Patient complains of more than 5 years history of intermittent bilateral hands and feet paresthesia, had nerve conduction study in 2017 that was normal, patient reported throughout the day, sometimes she has fingers, toes numbness, lasting 10 to 15 minutes, she has to work her hands to get the sensation comes back  In addition she complains of diffuse body achy pain, but denies functional limitation, works full-time job, denies gait abnormality.  She is also taking Lyrica 100 mg 3 times a day for low back pain, followed by pain management Dr. Ethelene Hal.  Extensive laboratory evaluation reviewed,  elevated A1c 6.9, creatinine 1.06, negative hepatitis C antibody, Treponema pallidum antibody was negative, Lyme titer, protein electrophoresis, copper, ESR B12, lipid panel, rheumatoid factor, ANA, was within normal limit.  Today's EMG nerve conduction study was normal,  UPDATE Oct 26 2021: Cymbalta 60 mg daily since last visit in December 2022, she tolerated well, reported significant improvement, no longer has diffuse body achy pain,  She can ambulate without much difficulty, but still has intermittent bilateral fingertips and toes paresthesia, no significant neck pain, occasionally low back pain, no bowel bladder incontinence  Laboratory evaluations in December 2022, normal negative CPK, ANA, ESR, B12, C-reactive protein, A1c was 6.9, negative RPR, hepatitis C, Lyme titer, protein electrophoresis, copper,  UPDATE Aug 25 2023: Meriam Sprague had long history of migraine headaches, she began to self treated with [facility] powder, daily multiple dose of BC powders since 1970s, hospital admission in August 2024 for looking pale, abdominal pain, endoscopy showed nonbleeding gastric ulcer, discharged with PPI, stopped daily Goody powder use  Since then, she began to have worsening daily headaches, lateralized retro-orbital headache, sometimes with light noise sensitivity, nauseous, lasting for days, she has tried Nurtec with suboptimal response, now on Ubrelvy 100 mg at as needed it seems to help her better, but not eliminate headaches, often her headache is proceeded by blurry vision, sometimes flashing light  Laboratory evaluation in November 2024 A1c 10.2, she likes to drink Pepsi, creatinine was normal at 0.98, hemoglobin in September was 9.4  PHYSICAL EXAM:   Vitals:   08/25/23 0914  BP: 119/81  Pulse: 91  Weight: 149 lb (67.6 kg)  Height: 5\' 4"  (1.626 m)  Not recorded     Body mass index is 25.58 kg/m.  PHYSICAL EXAMNIATION:  Gen: NAD, conversant, well nourised, well groomed         MENTAL STATUS: Speech/cognition: Awake, alert, oriented to history taking and casual conversation.   CRANIAL NERVES: CN II: Visual fields are full to confrontation. Pupils are round equal and briskly reactive to light.  Funduscopy examination showed sharp disc, thinning anterior CN III, IV, VI: extraocular movement are normal. No ptosis. CN V: Facial sensation is intact to light touch CN VII: Face is symmetric with normal eye closure  CN VIII: Hearing is normal to causal conversation. CN IX, X: Phonation is normal. CN XI: Head turning and shoulder shrug are intact  MOTOR: There is no pronator drift of out-stretched arms. Muscle bulk and tone are normal. Muscle strength is normal.  REFLEXES: Reflexes are 1 and symmetric at the biceps, triceps, knees, and ankles. Plantar responses are flexor.  SENSORY: Intact to light touch, pinprick and vibratory sensation are intact in fingers and toes.  COORDINATION: There is no trunk or limb dysmetria noted.  GAIT/STANCE: Gait is steady with normal   REVIEW OF SYSTEMS:  Full 14 system review of systems performed and notable only for as above All other review of systems were negative.   ALLERGIES: Allergies  Allergen Reactions   Latex     HOME MEDICATIONS: Current Outpatient Medications  Medication Sig Dispense Refill   Albuterol Sulfate (PROAIR RESPICLICK) 108 (90 Base) MCG/ACT AEPB 2 puffs q6h prn 1 each 3   atorvastatin (LIPITOR) 40 MG tablet      Calcium-Vitamin D-Vitamin K (VIACTIV PO) Take by mouth. 2 per day     Continuous Glucose Sensor (DEXCOM G7 SENSOR) MISC Use as directed 3 each 11   DULoxetine (CYMBALTA) 60 MG capsule TAKE ONE CAPSULE BY MOUTH DAILY 30 capsule 11   empagliflozin (JARDIANCE) 25 MG TABS tablet Take 1 tablet (25 mg total) by mouth daily before breakfast. 30 tablet 5   HYDROcodone-acetaminophen (NORCO) 10-325 MG tablet Take 1 tablet by mouth 3 (three) times daily as needed.     insulin glargine, 1 Unit  Dial, (TOUJEO SOLOSTAR) 300 UNIT/ML Solostar Pen Inject 24 units sq nightly, max titration dose 60 units     Insulin Pen Needle (PEN NEEDLES) 31G X 5 MM MISC Inject 1 each into the skin daily. 100 each 2   Iron-FA-B Cmp-C-Biot-Probiotic (FUSION PLUS) CAPS Take 1 capsule by mouth daily. 30 capsule 3   lisinopril (ZESTRIL) 10 MG tablet Take 1 tablet (10 mg total) by mouth daily. 90 tablet 2   Multiple Vitamin (MULTIVITAMIN) tablet Take 1 tablet by mouth daily.     omeprazole (PRILOSEC) 20 MG capsule 1 cap(s) orally twice a day for 14 days     OVER THE COUNTER MEDICATION Total beet chew     pantoprazole (PROTONIX) 40 MG tablet Take 1 tablet (40 mg total) by mouth daily. 90 tablet 1   Turmeric-Ginger 150-25 MG CHEW Chew by mouth.     Ubrogepant (UBRELVY) 100 MG TABS One tab po every day prn 12 tablet 0   No current facility-administered medications for this visit.    PAST MEDICAL HISTORY: Past Medical History:  Diagnosis Date   Arthritis    Carpal tunnel syndrome 01/01/2016   right   Diabetes mellitus without complication (HCC)    High cholesterol    Hypertension     PAST SURGICAL HISTORY: Past Surgical History:  Procedure Laterality Date   BIOPSY  06/01/2023   Procedure: BIOPSY;  Surgeon: Jeani Hawking, MD;  Location: [facility] ENDOSCOPY;  Service: Gastroenterology;;   CHOLECYSTECTOMY     ESOPHAGOGASTRODUODENOSCOPY (EGD) WITH PROPOFOL N/A 06/01/2023   Procedure: ESOPHAGOGASTRODUODENOSCOPY (EGD) WITH PROPOFOL;  Surgeon: Jeani Hawking, MD;  Location: [facility] ENDOSCOPY;  Service: Gastroenterology;  Laterality: N/A;   KNEE ARTHROSCOPY Right 1995   TONSILLECTOMY     TUBAL LIGATION  1986    FAMILY HISTORY: Family History  Problem Relation Age of Onset   Diabetes Mother    Heart Problems Mother    Hypertension Mother    Diabetes Father    Hypertension Father    Heart Problems Father    Hypertension Sister    Stroke Sister    Hyperlipidemia Sister    Cancer Sister        Adenocarcinoma    Heart Problems Brother    Healthy Son    Healthy Daughter    Healthy Daughter     SOCIAL HISTORY: Social History   Socioeconomic History   Marital status: Divorced    Spouse name: Not on file   Number of children: Not on file   Years of education: Not on file   Highest education level: Associate degree: occupational, Scientist, product/process development, or vocational program  Occupational History   Not on file  Tobacco Use   Smoking status: Never   Smokeless tobacco: Never  Vaping Use   Vaping status: Never Used  Substance and Sexual Activity   Alcohol use: Yes    Comment: 3 drinks monthly   Drug use: Never   Sexual activity: Not on file  Other Topics Concern   Not on file  Social History Narrative   Not on file   Social Determinants of Health   Financial Resource Strain: Low Risk  (07/21/2023)   Overall Financial Resource Strain (CARDIA)    Difficulty of Paying Living Expenses: Not hard at all  Food Insecurity: No Food Insecurity (07/21/2023)   Hunger Vital Sign    Worried About Running Out of Food in the Last Year: Never true    Ran Out of Food in the Last Year: Never true  Transportation Needs: No Transportation Needs (07/21/2023)   PRAPARE - Administrator, Civil Service (Medical): No    Lack of Transportation (Non-Medical): No  Physical Activity: Unknown (07/21/2023)   Exercise Vital Sign    Days of Exercise per Week: Patient declined    Minutes of Exercise per Session: 10 min  Stress: No Stress Concern Present (07/21/2023)   [employer] of Occupational Health - Occupational Stress Questionnaire    Feeling of Stress : Only a little  Social Connections: Moderately Integrated (07/21/2023)   Social Connection and Isolation Panel [NHANES]    Frequency of Communication with Friends and Family: More than three times a week    Frequency of Social Gatherings with Friends and Family: More than three times a week    Attends Religious Services: 1 to 4 times per year     Active Member of [employer] or Organizations: Yes    Attends Banker Meetings: More than 4 times per year    Marital Status: Divorced  Intimate Partner Violence: Not At Risk (05/31/2023)   Humiliation, Afraid, Rape, and Kick questionnaire    Fear of Current or Ex-Partner: No    Emotionally Abused: No    Physically Abused: No    Sexually Abused: No      Levert Feinstein, M.D. Ph.D.  [facility] [phone] [address]  [address], [address] [place], [place] [address] Ph: [phone] Fax: [phone]  CC:  Dorothyann Peng, MD [address] [place],  [place] [address]  Dorothyann Peng, MD

## 2023-08-25 NOTE — Patient Instructions (Signed)
Meds ordered this encounter  Medications   divalproex (DEPAKOTE ER) 500 MG 24 hr tablet--prevent migraine    Sig: Take 1 tablet (500 mg total) by mouth at bedtime.    Dispense:  30 tablet    Refill:  11  1 tiZANidine (ZANAFLEX) 4 MG tablet    Sig: Take 1 tablet (4 mg total) by mouth every 6 (six) hours as needed for muscle spasms.    Dispense:  30 tablet    Refill:  6  2 ondansetron (ZOFRAN) 4 MG tablet    Sig: Take 1 tablet (4 mg total) by mouth every 8 (eight) hours as needed for nausea or vomiting.    Dispense:  20 tablet    Refill:  6     Ubrevyl/Nurtec+2+3.

## 2023-08-29 ENCOUNTER — Other Ambulatory Visit: Payer: Self-pay

## 2023-08-29 ENCOUNTER — Emergency Department (HOSPITAL_BASED_OUTPATIENT_CLINIC_OR_DEPARTMENT_OTHER)
Admission: EM | Admit: 2023-08-29 | Discharge: 2023-08-29 | Disposition: A | Discharge status: Home / Self Care | Payer: 59 | Attending: Emergency Medicine | Admitting: Emergency Medicine

## 2023-08-29 ENCOUNTER — Other Ambulatory Visit: Payer: Self-pay | Admitting: Neurology

## 2023-08-29 ENCOUNTER — Emergency Department (HOSPITAL_BASED_OUTPATIENT_CLINIC_OR_DEPARTMENT_OTHER): Payer: 59 | Admitting: Radiology

## 2023-08-29 ENCOUNTER — Encounter (HOSPITAL_BASED_OUTPATIENT_CLINIC_OR_DEPARTMENT_OTHER): Payer: Self-pay | Admitting: Emergency Medicine

## 2023-08-29 DIAGNOSIS — E1122 Type 2 diabetes mellitus with diabetic chronic kidney disease: Secondary | ICD-10-CM | POA: Diagnosis not present

## 2023-08-29 DIAGNOSIS — W19XXXA Unspecified fall, initial encounter: Secondary | ICD-10-CM

## 2023-08-29 DIAGNOSIS — M79601 Pain in right arm: Secondary | ICD-10-CM | POA: Diagnosis present

## 2023-08-29 DIAGNOSIS — W1830XA Fall on same level, unspecified, initial encounter: Secondary | ICD-10-CM | POA: Diagnosis not present

## 2023-08-29 DIAGNOSIS — Z794 Long term (current) use of insulin: Secondary | ICD-10-CM | POA: Insufficient documentation

## 2023-08-29 DIAGNOSIS — S60412A Abrasion of right middle finger, initial encounter: Secondary | ICD-10-CM | POA: Insufficient documentation

## 2023-08-29 DIAGNOSIS — Z79899 Other long term (current) drug therapy: Secondary | ICD-10-CM | POA: Diagnosis not present

## 2023-08-29 DIAGNOSIS — Z91040 Latex allergy status: Secondary | ICD-10-CM | POA: Diagnosis not present

## 2023-08-29 DIAGNOSIS — N183 Chronic kidney disease, stage 3 unspecified: Secondary | ICD-10-CM | POA: Insufficient documentation

## 2023-08-29 DIAGNOSIS — Z7984 Long term (current) use of oral hypoglycemic drugs: Secondary | ICD-10-CM | POA: Insufficient documentation

## 2023-08-29 DIAGNOSIS — M25511 Pain in right shoulder: Secondary | ICD-10-CM | POA: Insufficient documentation

## 2023-08-29 DIAGNOSIS — I129 Hypertensive chronic kidney disease with stage 1 through stage 4 chronic kidney disease, or unspecified chronic kidney disease: Secondary | ICD-10-CM | POA: Diagnosis not present

## 2023-08-29 NOTE — ED Notes (Signed)

## 2023-08-29 NOTE — ED Triage Notes (Signed)
Pt reports mechanical fall yesterday. Pt c/o RT shoulder, arm and hand pain. Denies head injury, denies loc OR thinners, Tylenol at 1000

## 2023-08-29 NOTE — ED Provider Notes (Signed)
[place] EMERGENCY DEPARTMENT AT [facility] Provider Note   CSN: [id number] Arrival date & time: 08/29/23  1042     History  Chief Complaint  Patient presents with   Marletta Lor    Heather Tanner is a 67 y.o. female.   Fall   67 year old female presents emergency department with complaints of fall.  Patient states that she was walking into [employer] yesterday when she was concerned about an oncoming vehicle that was not in a stop.  States that she was not looking where she was walking and tripped over the corner of a sign.  States that she landed on her right arm dose flat against her body.  Denies trauma to head, loss consciousness, blood thinner use.  Denies any chest pain, shortness of breath, abdominal pain, nausea, vomiting, leg pain bilaterally.  States that she is concerned she may have fractured her arm.  Past medical history significant for diabetes mellitus, hypertension, hypercholesterolemia, carpal tunnel syndrome, CKD 3, lumbar degenerative disc disease, diabetic mononeuropathy  Home Medications Prior to Admission medications   Medication Sig Start Date End Date Taking? Authorizing Provider  Albuterol Sulfate (PROAIR RESPICLICK) 108 (90 Base) MCG/ACT AEPB 2 puffs q6h prn 09/08/22   Dorothyann Peng, MD  atorvastatin (LIPITOR) 40 MG tablet     [provider]  Calcium-Vitamin D-Vitamin K (VIACTIV PO) Take by mouth. 2 per day    [provider]  Continuous Glucose Sensor (DEXCOM G7 SENSOR) MISC Use as directed 04/22/23   Dorothyann Peng, MD  divalproex (DEPAKOTE ER) 500 MG 24 hr tablet Take 1 tablet (500 mg total) by mouth at bedtime. 08/25/23   Levert Feinstein, MD  DULoxetine (CYMBALTA) 60 MG capsule TAKE ONE CAPSULE BY MOUTH DAILY 09/01/22   Levert Feinstein, MD  empagliflozin (JARDIANCE) 25 MG TABS tablet Take 1 tablet (25 mg total) by mouth daily before breakfast. 07/25/23   Dorothyann Peng, MD  HYDROcodone-acetaminophen [facility]) 10-325 MG tablet Take 1  tablet by mouth 3 (three) times daily as needed. 07/30/21   [provider]  insulin glargine, 1 Unit Dial, (TOUJEO SOLOSTAR) 300 UNIT/ML Solostar Pen Inject 24 units sq nightly, max titration dose 60 units 08/22/23   Dorothyann Peng, MD  Insulin Pen Needle (PEN NEEDLES) 31G X 5 MM MISC Inject 1 each into the skin daily. 12/09/21   Dorothyann Peng, MD  Iron-FA-B Cmp-C-Biot-Probiotic (FUSION PLUS) CAPS Take 1 capsule by mouth daily. 07/04/23   Dorothyann Peng, MD  lisinopril (ZESTRIL) 10 MG tablet Take 1 tablet (10 mg total) by mouth daily. 08/05/23   Dorothyann Peng, MD  Multiple Vitamin (MULTIVITAMIN) tablet Take 1 tablet by mouth daily.    [provider]  omeprazole (PRILOSEC) 20 MG capsule 1 cap(s) orally twice a day for 14 days 06/22/23   [provider]  ondansetron (ZOFRAN) 4 MG tablet Take 1 tablet (4 mg total) by mouth every 8 (eight) hours as needed for nausea or vomiting. 08/25/23   Levert Feinstein, MD  OVER THE COUNTER MEDICATION Total beet chew    [provider]  pantoprazole (PROTONIX) 40 MG tablet Take 1 tablet (40 mg total) by mouth daily. 08/22/23 08/21/24  Dorothyann Peng, MD  tiZANidine (ZANAFLEX) 4 MG tablet Take 1 tablet (4 mg total) by mouth every 6 (six) hours as needed for muscle spasms. 08/25/23   Levert Feinstein, MD  Turmeric-Ginger 150-25 MG CHEW Chew by mouth.    [provider]  Ubrogepant (UBRELVY) 100 MG TABS One tab po every day prn  08/25/23   Levert Feinstein, MD      Allergies    Latex    Review of Systems   Review of Systems  All other systems reviewed and are negative.   Physical Exam Updated Vital Signs BP 121/76 (BP Location: Left Arm)   Pulse 100   Temp 97.7 F (36.5 C)   Resp 17   Wt 65.3 kg   SpO2 99%   BMI 24.72 kg/m  Physical Exam Vitals and nursing note reviewed.  Constitutional:      General: She is not in acute distress.    Appearance: She is well-developed.  HENT:     Head: Normocephalic and atraumatic.  Eyes:      Conjunctiva/sclera: Conjunctivae normal.  Cardiovascular:     Rate and Rhythm: Normal rate and regular rhythm.     Heart sounds: No murmur heard. Pulmonary:     Effort: Pulmonary effort is normal. No respiratory distress.     Breath sounds: Normal breath sounds.  Abdominal:     Palpations: Abdomen is soft.     Tenderness: There is no abdominal tenderness.  Musculoskeletal:        General: No swelling.     Cervical back: Neck supple.     Comments: No line tenderness of cervical, thoracic, lumbar spine without step-off or deformity.  No chest wall tenderness.  Patient with tenderness right proximal humerus, right distal ulna/radius.  Full range of motion right elbow, wrist, digits.  Skin avulsion appreciated on the dorsal aspect of patient's right fifth finger as well as tiny abrasion appreciated distal aspect of right third finger.  Radial pulses 2+ bilaterally.  Cap refill less than 2 seconds.  Skin:    General: Skin is warm and dry.     Capillary Refill: Capillary refill takes less than 2 seconds.  Neurological:     Mental Status: She is alert.     Comments: Alert and oriented to self, place, time and event.   Speech is fluent, clear without dysarthria or dysphasia.   Strength symmetric in upper/lower extremities   Sensation intact in upper/lower extremities   Normal gait.  CN I not tested  CN II not tested CN III, IV, VI PERRLA and EOMs intact bilaterally  CN V Intact sensation to sharp and light touch to the face  CN VII facial movements symmetric  CN VIII not tested  CN IX, X no uvula deviation, symmetric rise of soft palate  CN XI 5/5 SCM and trapezius strength bilaterally  CN XII Midline tongue protrusion, symmetric L/R movements     Psychiatric:        Mood and Affect: Mood normal.     ED Results / Procedures / Treatments   Labs (all labs ordered are listed, but only abnormal results are displayed) Labs Reviewed - No data to  display  EKG None  Radiology DG Hand Complete Right  Result Date: 08/29/2023 CLINICAL DATA:  Fall.  Right hand pain. EXAM: RIGHT HAND - COMPLETE 3+ VIEW COMPARISON:  Right hand radiographs 01/30/2021 FINDINGS: Moderate third DIP joint space narrowing with mild subchondral sclerosis and cystic change and mild dorsal osteophytosis. A small calcification at the dorsal aspect of the joint is unchanged from prior. Mild-to-moderate 2nd DIP joint space narrowing and peripheral osteophytosis. Mild second and fifth PIP joint space narrowing. No acute fracture or dislocation. IMPRESSION: 1. No acute fracture. 2. 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 Electronically Signed   By: Kerin Salen.D.  On: 08/29/2023 12:07   DG Forearm Right  Result Date: 08/29/2023 CLINICAL DATA:  Right arm pain after mechanical fall yesterday. EXAM: RIGHT FOREARM - 2 VIEW COMPARISON:  None Available. FINDINGS: No acute fracture is seen. Normal alignment at the elbow and wrist. No elbow joint effusion is seen. Minimal degenerative spurring of the tip of the coronoid process. IMPRESSION: No acute fracture. Electronically Signed   By: Neita Garnet M.D.   On: 08/29/2023 12:03   DG Humerus Right  Result Date: 08/29/2023 CLINICAL DATA:  Mechanical fall yesterday. Right shoulder, arm, and hand pain. EXAM: RIGHT SHOULDER - 2+ VIEW; RIGHT HUMERUS - 2+ VIEW COMPARISON:  None Available. FINDINGS: Right shoulder: Moderate acromioclavicular joint space narrowing and peripheral osteophytosis. Mild lateral downsloping of the acromion with moderate distal lateral subacromial spurring. Moderate to large anterior inferior acromion degenerative spur seen on transscapular Y-view. There is subcortical lucency within the acromial spur and the adjacent anterior inferior humeral head possibly from chronic subacromial impingement. Minimal apparent bone hypertrophy/spurring within the anterior humeral head greater tuberosity  also compatible with the sequela of subacromial impingement. Mild glenohumeral joint space narrowing and peripheral osteophytosis. No definite acute fracture is seen. No dislocation. Moderate multilevel degenerative disc changes of the thoracic spine. Right humerus: No acute fracture is seen within the right humerus. IMPRESSION: 1. No definite acute fracture is seen within the right shoulder or right humerus. 2. Moderate acromioclavicular and mild glenohumeral osteoarthritis. 3. Moderate to large anterior inferior acromion degenerative spur. Mild lateral downsloping of the acromion with moderate distal lateral subacromial spurring. Findings within the anterior greater tuberosity of the humeral head and the acromion compatible with the sequela of chronic subacromial impingement. Electronically Signed   By: Neita Garnet M.D.   On: 08/29/2023 12:02   DG Shoulder Right  Result Date: 08/29/2023 CLINICAL DATA:  Mechanical fall yesterday. Right shoulder, arm, and hand pain. EXAM: RIGHT SHOULDER - 2+ VIEW; RIGHT HUMERUS - 2+ VIEW COMPARISON:  None Available. FINDINGS: Right shoulder: Moderate acromioclavicular joint space narrowing and peripheral osteophytosis. Mild lateral downsloping of the acromion with moderate distal lateral subacromial spurring. Moderate to large anterior inferior acromion degenerative spur seen on transscapular Y-view. There is subcortical lucency within the acromial spur and the adjacent anterior inferior humeral head possibly from chronic subacromial impingement. Minimal apparent bone hypertrophy/spurring within the anterior humeral head greater tuberosity also compatible with the sequela of subacromial impingement. Mild glenohumeral joint space narrowing and peripheral osteophytosis. No definite acute fracture is seen. No dislocation. Moderate multilevel degenerative disc changes of the thoracic spine. Right humerus: No acute fracture is seen within the right humerus. IMPRESSION: 1. No  definite acute fracture is seen within the right shoulder or right humerus. 2. Moderate acromioclavicular and mild glenohumeral osteoarthritis. 3. Moderate to large anterior inferior acromion degenerative spur. Mild lateral downsloping of the acromion with moderate distal lateral subacromial spurring. Findings within the anterior greater tuberosity of the humeral head and the acromion compatible with the sequela of chronic subacromial impingement. Electronically Signed   By: Neita Garnet M.D.   On: 08/29/2023 12:02    Procedures Procedures    Medications Ordered in ED Medications - No data to display  ED Course/ Medical Decision Making/ A&P                                 Medical Decision Making Amount and/or Complexity of Data Reviewed Radiology: ordered.   This  patient presents to the ED for concern of fall, this involves an extensive number of treatment options, and is a complaint that carries with it a high risk of complications and morbidity.  The differential diagnosis includes CVA, fracture, strain/pain, dislocation, ligamentous/tendinous injury, neurovascular compromise, pneumothorax, solid organ damage, other   Co morbidities that complicate the patient evaluation  See HPI   Additional history obtained:  Additional history obtained from EMR External records from outside source obtained and reviewed including hospital records   Lab Tests:  N/a   Imaging Studies ordered:  I ordered imaging studies including right shoulder x-ray, right humerus x-ray, right forearm x-ray, right hand x-ray I independently visualized and interpreted imaging which showed  Right shoulder/humerus x-ray: No acute fracture.  Moderate before meals and mild glenohumeral OA.  Monitor large anterior inferior acromion degenerative spurring. Right forearm x-ray: No acute osseous abnormality. Right hand x-ray: No acute osseous abnormality.  OA changes of second IP I agree with the radiologist  interpretation  Cardiac Monitoring: / EKG:  The patient was maintained on a cardiac monitor.  I personally viewed and interpreted the cardiac monitored which showed an underlying rhythm of: Sinus rhythm   Consultations Obtained:  N/a   Problem List / ED Course / Critical interventions / Medication management  Fall, right arm pain Reevaluation of the patient showed that the patient stayed the same I have reviewed the patients home medicines and have made adjustments as needed   Social Determinants of Health:  Denies tobacco, licit drug use   Test / Admission - Considered:  Fall, right arm pain Vitals signs within normal range and stable throughout visit. Imaging studies significant for: See above 67 year old female presents emergency department for mechanical fall falling on right arm.  No trauma to head, LOC, anticoagulation use.  Patient with nonfocal neurologic exam, CT imaging of head was foregone.  Patient with main tenderness reproducible right arm diffusely.  X-ray imaging ordered by triage staff of right upper extremity which was negative for any acute osseous abnormality but significant for pretty significant OA changes of right shoulder.  Upon further exam, patient with limited range of motion baseline right shoulder even prior to fall that has slightly worsened since fall.  Patient reassured by workup.  Will recommend follow-up with orthopedics given amount of degenerative changes and right-sided shoulder.  Will recommend symptomatic therapy as described in AVS.  Treatment plan discussed at length with patient and she acknowledged understanding was agreeable to said plan.  Patient overall well-appearing, afebrile in no acute distress. Worrisome signs and symptoms were discussed with the patient, and the patient acknowledged understanding to return to the ED if noticed. Patient was stable upon discharge.          Final Clinical Impression(s) / ED Diagnoses Final  diagnoses:  Fall, initial encounter  Right arm pain    Rx / DC Orders ED Discharge Orders     None         Peter Garter, [place] 08/29/23 1307    Gloris Manchester, MD 08/29/23 1556

## 2023-08-29 NOTE — Discharge Instructions (Addendum)
As discussed, workup today overall reassuring.  X-ray imaging of your right arm was negative for fracture or dislocation.  You do have pretty significant arthritis in your right shoulder.  Will recommend icing your right shoulder as well as taking Tylenol for treatment of pain.  Follow-up with your [facility] specialist in the outpatient setting for reassessment.  Please do not hesitate to return if the worrisome signs and symptoms we discussed become apparent.

## 2023-08-31 ENCOUNTER — Ambulatory Visit
Admission: RE | Admit: 2023-08-31 | Discharge: 2023-08-31 | Disposition: A | Discharge status: Home / Self Care | Payer: 59 | Source: Ambulatory Visit | Attending: Internal Medicine | Admitting: Internal Medicine

## 2023-08-31 DIAGNOSIS — Z1231 Encounter for screening mammogram for malignant neoplasm of breast: Secondary | ICD-10-CM

## 2023-09-02 IMAGING — MG MM DIGITAL SCREENING BILAT W/ TOMO AND CAD
8 series · 9 of 24 positions shown · non-contrast
Comparison: Previous exam(s).

CLINICAL DATA: Screening.

EXAM:
DIGITAL SCREENING BILATERAL MAMMOGRAM WITH TOMOSYNTHESIS AND CAD
TECHNIQUE: Bilateral screening digital craniocaudal and mediolateral oblique
mammograms were obtained. Bilateral screening digital breast
tomosynthesis was performed. The images were evaluated with
computer-aided detection.

[R MLO synth-2D]
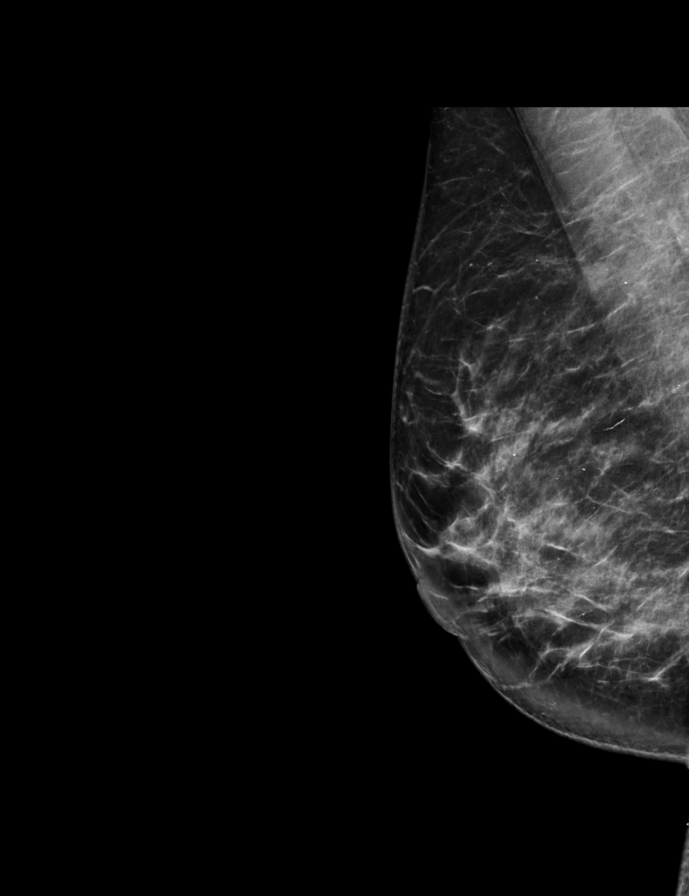

[R CC synth-2D]
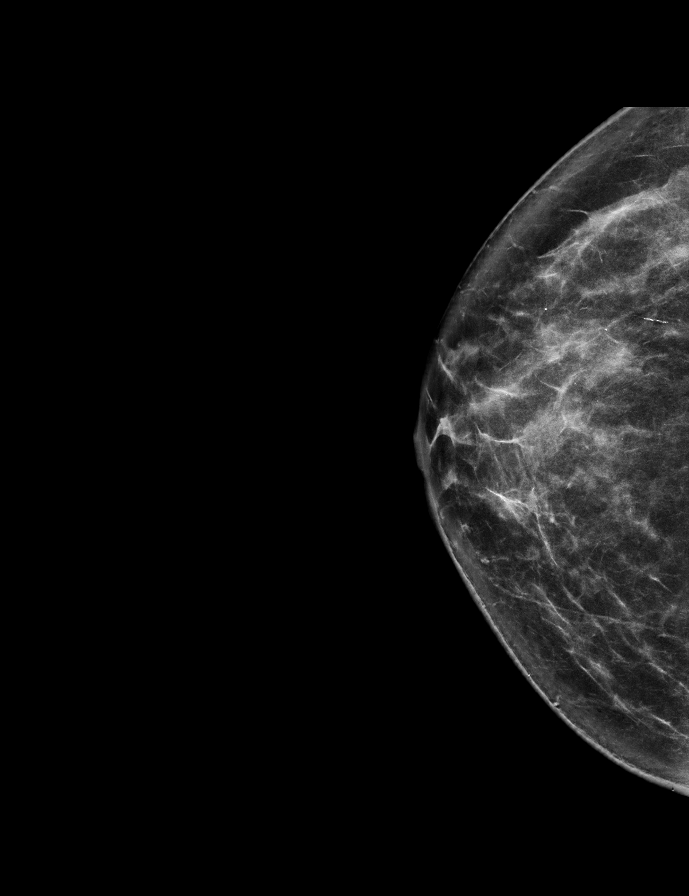

[L CC synth-2D]
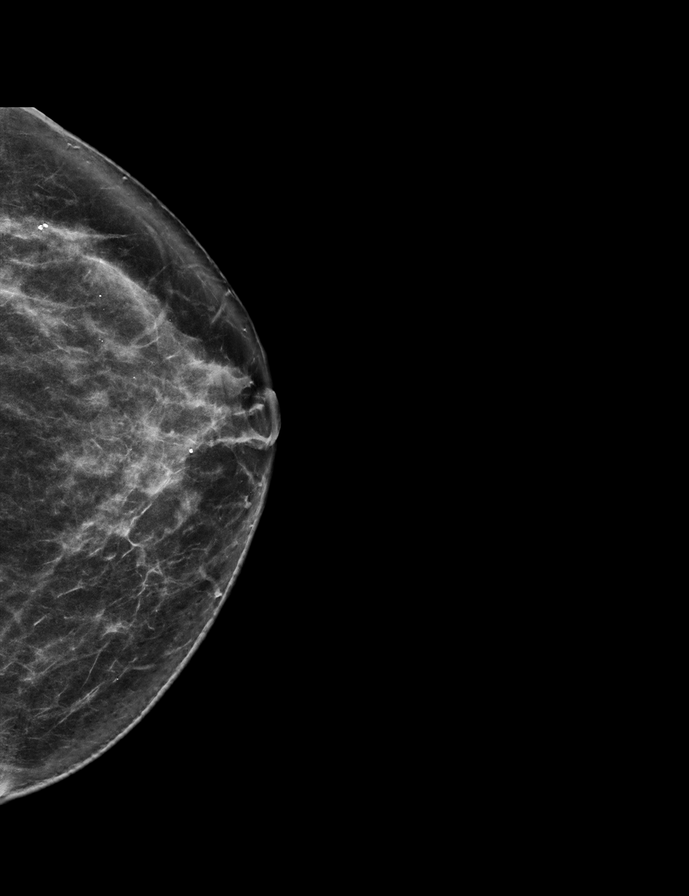

[L MLO synth-2D]
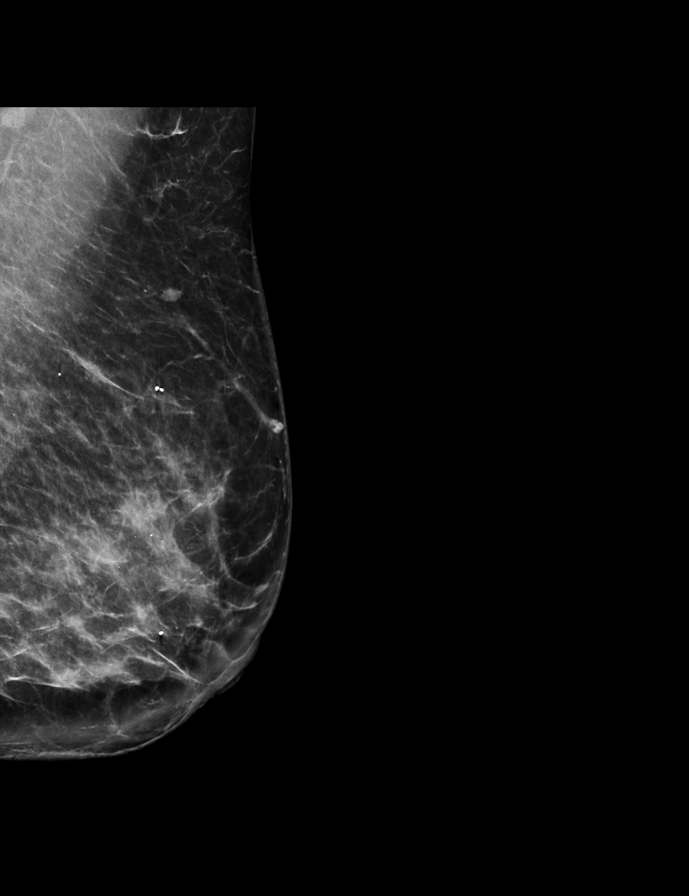

[L MLO tomo · 2 of 76 frames shown]
[frame 25/76]
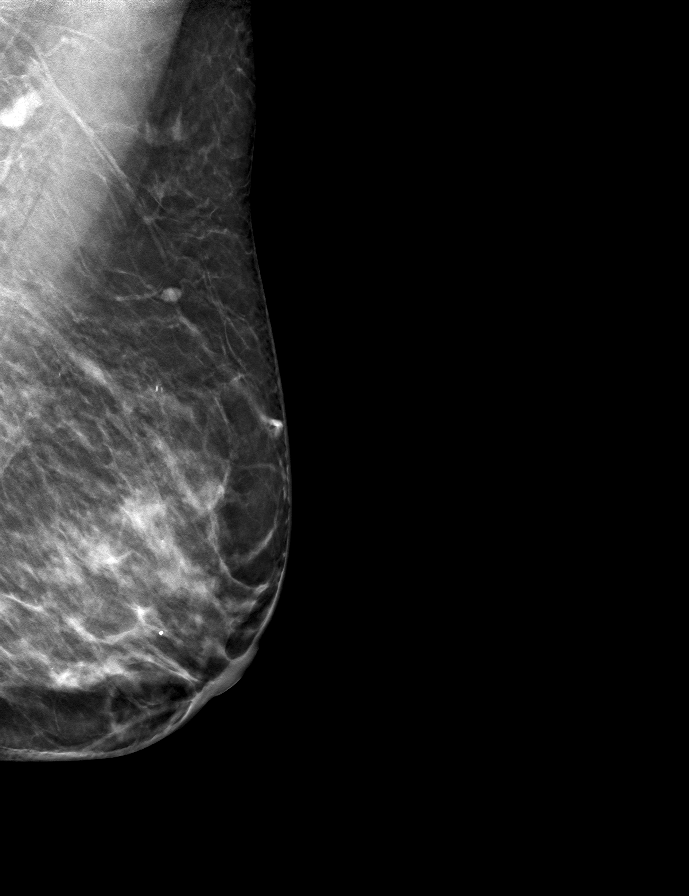
[frame 39/76]
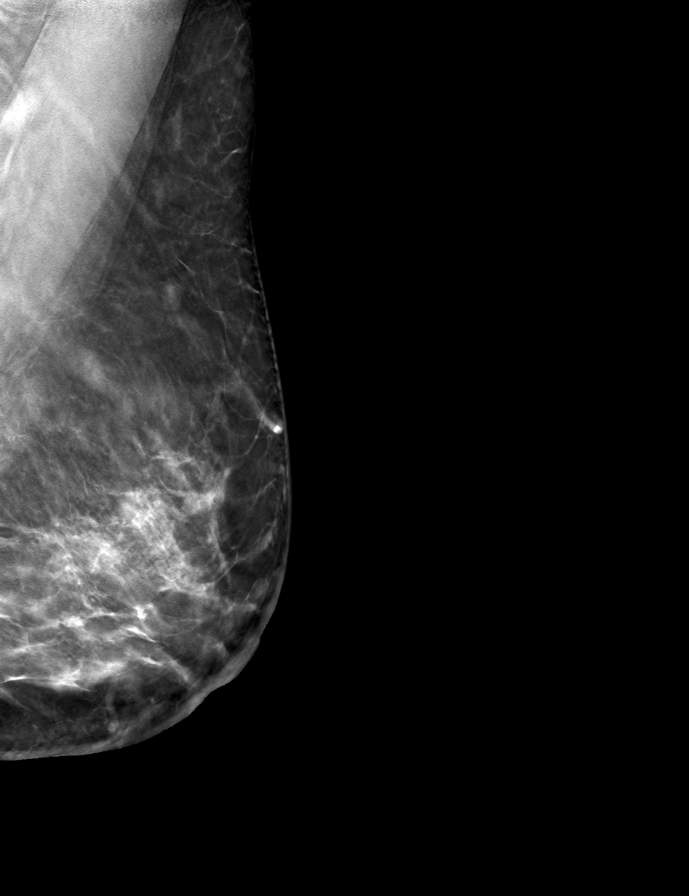

[R CC tomo · tomo slice 37/72.0]
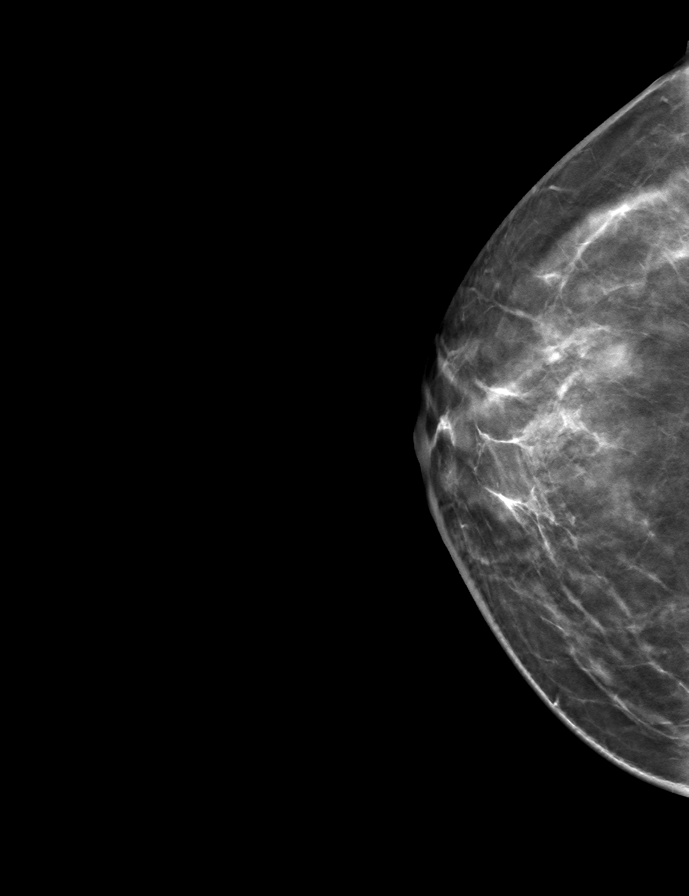

[R MLO tomo · tomo slice 39/78.0]
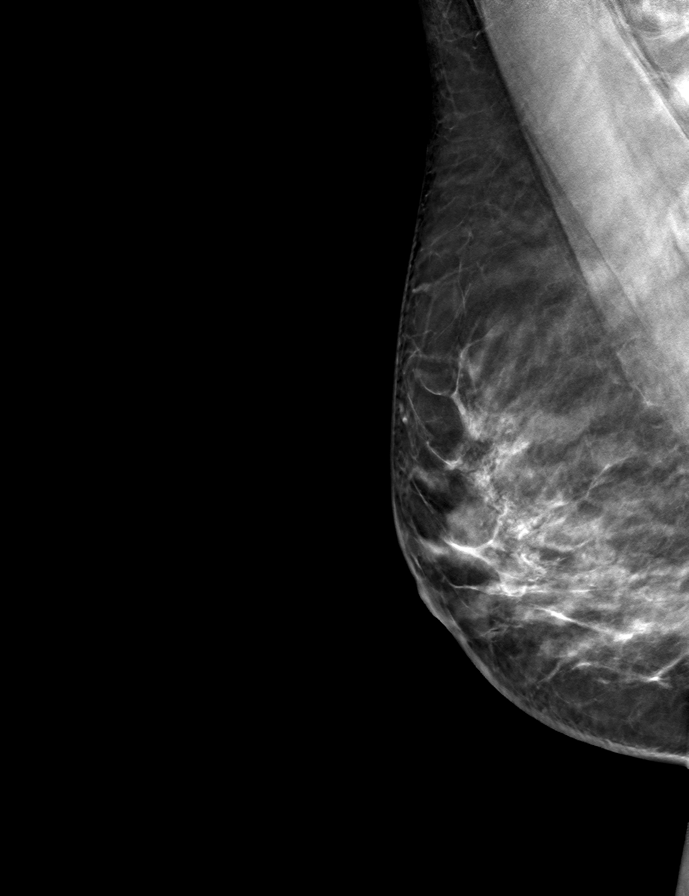

[L CC tomo · tomo slice 34/67.0]
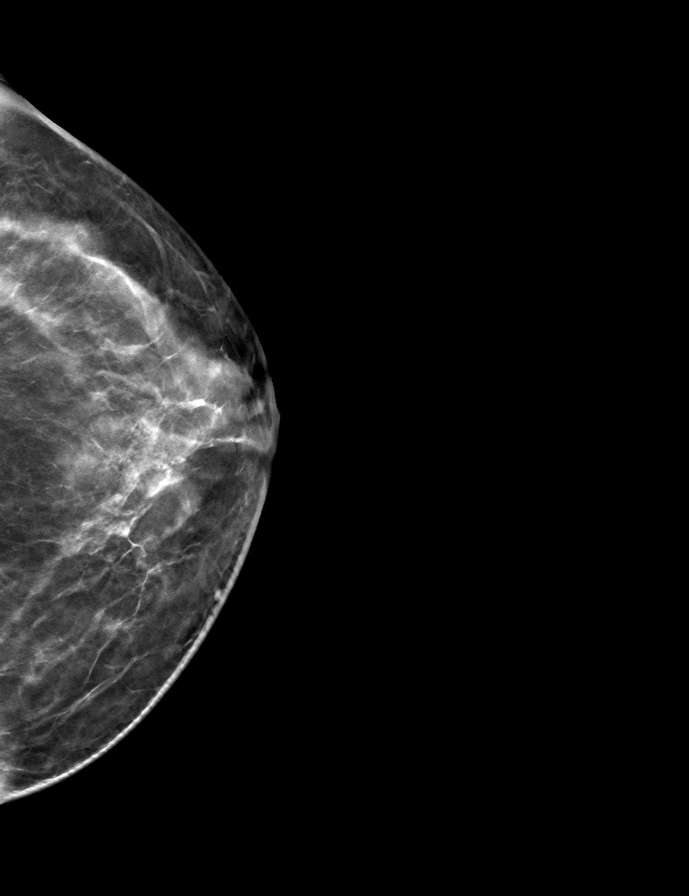

[9 of 24 positions shown; findings below may reference images not displayed]

ACR Breast Density Category c: The breast tissue is heterogeneously
dense, which may obscure small masses.
FINDINGS: There are no findings suspicious for malignancy.
IMPRESSION: No mammographic evidence of malignancy. A result letter of this
screening mammogram will be mailed directly to the patient.

RECOMMENDATION:
Screening mammogram in one year. (Code:Q3-W-BC3)

BI-RADS CATEGORY  1: Negative.

## 2023-09-07 ENCOUNTER — Ambulatory Visit: Payer: 59

## 2023-09-07 DIAGNOSIS — R202 Paresthesia of skin: Secondary | ICD-10-CM

## 2023-09-07 DIAGNOSIS — G43E19 Chronic migraine with aura, intractable, without status migrainosus: Secondary | ICD-10-CM

## 2023-09-20 ENCOUNTER — Other Ambulatory Visit: Payer: Self-pay

## 2023-09-20 ENCOUNTER — Other Ambulatory Visit (HOSPITAL_COMMUNITY): Payer: Self-pay

## 2023-09-20 ENCOUNTER — Telehealth: Payer: Self-pay

## 2023-09-20 DIAGNOSIS — R52 Pain, unspecified: Secondary | ICD-10-CM

## 2023-09-20 DIAGNOSIS — R202 Paresthesia of skin: Secondary | ICD-10-CM

## 2023-09-20 DIAGNOSIS — G43E19 Chronic migraine with aura, intractable, without status migrainosus: Secondary | ICD-10-CM

## 2023-09-20 NOTE — Telephone Encounter (Signed)
Pharmacy Patient Advocate Encounter   Received notification from CoverMyMeds that prior authorization for Ubrelvy 100MG  tablets is required/requested.   Insurance verification completed.   The patient is insured through [facility] .   Per test claim: PA required; PA submitted to above mentioned insurance via CoverMyMeds Key/confirmation #/EOC [id number] Status is pending

## 2023-09-21 ENCOUNTER — Other Ambulatory Visit: Payer: 59

## 2023-09-21 DIAGNOSIS — Z794 Long term (current) use of insulin: Secondary | ICD-10-CM

## 2023-09-21 LAB — HEMOGLOBIN A1C
Est. average glucose Bld gHb Est-mCnc: 209 mg/dL
Hgb A1c MFr Bld: 8.9 % — ABNORMAL HIGH (ref 4.8–5.6)

## 2023-09-26 ENCOUNTER — Other Ambulatory Visit: Payer: 59

## 2023-10-01 ENCOUNTER — Other Ambulatory Visit: Payer: Self-pay | Admitting: Internal Medicine

## 2023-10-06 MED ORDER — SUMATRIPTAN SUCCINATE 50 MG PO TABS: ORAL_TABLET | ORAL | 5 refills | Status: DC

## 2023-10-06 NOTE — Telephone Encounter (Addendum)
 I spoke with the patient and let her know that IMITREX   50 mg has been prescribed to try instead of Ubrelvy  since its not covered at this time. We went over the directions. Pt aware of some of the side effects of Sumatriptan . She will call us  with any questions or concerns. She confirmed she is not aware of any history of coronary artery disease. She thanked me for the call.

## 2023-10-06 NOTE — Telephone Encounter (Signed)
 Dr Terrace Arabia, what do you recommend? I see no history of triptan trial in patient's chart and insurance requires she try two triptans for at least 3 migraine episodes each before she can have a CGRP for acute use.

## 2023-10-06 NOTE — Telephone Encounter (Signed)
 Meds ordered this encounter  Medications   SUMAtriptan  (IMITREX ) 50 MG tablet    Sig: May repeat in 2 hours if headache persists or recurs.    Dispense:  10 tablet    Refill:  5    She has multiple vascular risk factors, aging, hypertension, hyperlipidemia, diabetes  Please check with patient, make sure she does not have coronary artery disease, MRI of the brain was normal  Give her low-dose Imitrex  50 mg as needed, let her call office for progress report

## 2023-10-06 NOTE — Telephone Encounter (Signed)
 Pharmacy Patient Advocate Encounter  Received notification from [employer] that Prior Authorization for Ubrelvy  100MG  tablets has been DENIED.  Full denial letter will be uploaded to the media tab. See denial reason below.   PA #/Case ID/Reference #: PA Case ID #: [id number]

## 2023-10-20 ENCOUNTER — Encounter: Payer: Self-pay | Admitting: Internal Medicine

## 2023-10-20 ENCOUNTER — Ambulatory Visit: Payer: 59 | Admitting: Internal Medicine

## 2023-10-20 VITALS — BP 122/70 | HR 90 | Temp 98.5°F | Ht 64.0 in | Wt 146.6 lb

## 2023-10-20 DIAGNOSIS — I129 Hypertensive chronic kidney disease with stage 1 through stage 4 chronic kidney disease, or unspecified chronic kidney disease: Secondary | ICD-10-CM

## 2023-10-20 DIAGNOSIS — Z794 Long term (current) use of insulin: Secondary | ICD-10-CM

## 2023-10-20 DIAGNOSIS — D500 Iron deficiency anemia secondary to blood loss (chronic): Secondary | ICD-10-CM

## 2023-10-20 DIAGNOSIS — E1122 Type 2 diabetes mellitus with diabetic chronic kidney disease: Secondary | ICD-10-CM

## 2023-10-20 DIAGNOSIS — G43709 Chronic migraine without aura, not intractable, without status migrainosus: Secondary | ICD-10-CM

## 2023-10-20 DIAGNOSIS — N1831 Chronic kidney disease, stage 3a: Secondary | ICD-10-CM

## 2023-10-20 MED ORDER — SEMAGLUTIDE(0.25 OR 0.5MG/DOS) 2 MG/3ML ~~LOC~~ SOPN: PEN_INJECTOR | SUBCUTANEOUS | 1 refills | Status: DC

## 2023-10-20 NOTE — Patient Instructions (Signed)

## 2023-10-20 NOTE — Progress Notes (Signed)
I,Heather Tanner, CMA,acting as a Neurosurgeon for Heather Aliment, MD.,have documented all relevant documentation on the behalf of Heather Aliment, MD,as directed by  Heather Aliment, MD while in the presence of Heather Aliment, MD.  Subjective:  Patient ID: Heather Tanner , female    DOB: Oct 22, 1955 , 68 y.o.   MRN: [id number]  Chief Complaint  Patient presents with   Diabetes    HPI  Patient presents today for headaches and DM/HTN follow up. She reports compliance with medications. She denies having any headaches, chest pain & sob. She denies having any specific questions or concerns.   Diabetes She presents for her follow-up diabetic visit. She has type 2 diabetes mellitus. Hypoglycemia symptoms include headaches. Pertinent negatives for diabetes include no blurred vision and no chest pain. There are no hypoglycemic complications. Diabetic complications include peripheral neuropathy and retinopathy. Risk factors for coronary artery disease include diabetes mellitus, dyslipidemia, hypertension, post-menopausal and sedentary lifestyle. She participates in exercise intermittently. An ACE inhibitor/angiotensin II receptor blocker is being taken.  Hypertension This is a chronic problem. The current episode started more than 1 year ago. The problem has been gradually improving since onset. The problem is controlled. Associated symptoms include headaches. Pertinent negatives include no blurred vision or chest pain. Past treatments include ACE inhibitors. Compliance problems include exercise.  Hypertensive end-organ damage includes retinopathy.     Past Medical History:  Diagnosis Date   Arthritis    Carpal tunnel syndrome 01/01/2016   right   Diabetes mellitus without complication (HCC)    High cholesterol    Hypertension      Family History  Problem Relation Age of Onset   Diabetes Mother    Heart Problems Mother    Hypertension Mother    Diabetes Father    Hypertension Father     Heart Problems Father    Hypertension Sister    Stroke Sister    Hyperlipidemia Sister    Cancer Sister        Adenocarcinoma   Heart Problems Brother    Healthy Son    Healthy Daughter    Healthy Daughter      Current Outpatient Medications:    atorvastatin (LIPITOR) 40 MG tablet, , Disp: , Rfl:    Calcium-Vitamin D-Vitamin K (VIACTIV PO), Take by mouth. 2 per day, Disp: , Rfl:    Continuous Glucose Sensor (DEXCOM G7 SENSOR) MISC, Use as directed, Disp: 3 each, Rfl: 11   divalproex (DEPAKOTE ER) 500 MG 24 hr tablet, Take 1 tablet (500 mg total) by mouth at bedtime., Disp: 30 tablet, Rfl: 11   DULoxetine (CYMBALTA) 60 MG capsule, TAKE 1 CAPSULE BY MOUTH DAILY, Disp: 30 capsule, Rfl: 11   empagliflozin (JARDIANCE) 25 MG TABS tablet, Take 1 tablet (25 mg total) by mouth daily before breakfast., Disp: 30 tablet, Rfl: 5   HYDROcodone-acetaminophen (NORCO) 10-325 MG tablet, Take 1 tablet by mouth 3 (three) times daily as needed., Disp: , Rfl:    insulin glargine, 1 Unit Dial, (TOUJEO SOLOSTAR) 300 UNIT/ML Solostar Pen, Inject 24 units sq nightly, max titration dose 60 units, Disp: , Rfl:    Insulin Pen Needle (PEN NEEDLES) 31G X 5 MM MISC, Inject 1 each into the skin daily., Disp: 100 each, Rfl: 2   Iron-FA-B Cmp-C-Biot-Probiotic (FUSION PLUS) CAPS, Take 1 capsule by mouth daily., Disp: 30 capsule, Rfl: 3   lisinopril (ZESTRIL) 10 MG tablet, Take 1 tablet (10 mg total) by mouth daily., Disp: 90 tablet,  Rfl: 2   Multiple Vitamin (MULTIVITAMIN) tablet, Take 1 tablet by mouth daily., Disp: , Rfl:    omeprazole (PRILOSEC) 20 MG capsule, 1 cap(s) orally twice a day for 14 days, Disp: , Rfl:    ondansetron (ZOFRAN) 4 MG tablet, Take 1 tablet (4 mg total) by mouth every 8 (eight) hours as needed for nausea or vomiting., Disp: 20 tablet, Rfl: 6   OVER THE COUNTER MEDICATION, Total beet chew, Disp: , Rfl:    pantoprazole (PROTONIX) 40 MG tablet, Take 1 tablet (40 mg total) by mouth daily., Disp: 90  tablet, Rfl: 1   Semaglutide,0.25 or 0.5MG /DOS, 2 MG/3ML SOPN, Inject 0.5mg  sq weekly, Disp: 3 mL, Rfl: 1   SUMAtriptan (IMITREX) 50 MG tablet, May repeat in 2 hours if headache persists or recurs., Disp: 10 tablet, Rfl: 5   tiZANidine (ZANAFLEX) 4 MG tablet, Take 1 tablet (4 mg total) by mouth every 6 (six) hours as needed for muscle spasms., Disp: 30 tablet, Rfl: 6   Turmeric-Ginger 150-25 MG CHEW, Chew by mouth., Disp: , Rfl:    Ubrogepant (UBRELVY) 100 MG TABS, One tab po every day prn, Disp: 12 tablet, Rfl: 11   Albuterol Sulfate (PROAIR RESPICLICK) 108 (90 Base) MCG/ACT AEPB, 2 puffs q6h prn, Disp: 1 each, Rfl: 3   Allergies  Allergen Reactions   Latex      Review of Systems  Constitutional: Negative.   HENT: Negative.    Eyes:  Negative for blurred vision.  Respiratory: Negative.    Cardiovascular: Negative.  Negative for chest pain.  Gastrointestinal: Negative.   Neurological:  Positive for headaches.       She still has intermittent headaches. They have decreased in both severity and frequency. She was prescribed Ubrelvy by Neuro, but insurance did not cover.      Today's Vitals   10/20/23 1551  BP: 122/70  Pulse: 90  Temp: 98.5 F (36.9 C)  SpO2: 98%  Weight: 146 lb 9.6 oz (66.5 kg)  Height: 5\' 4"  (1.626 m)   Body mass index is 25.16 kg/m.  Wt Readings from Last 3 Encounters:  10/20/23 146 lb 9.6 oz (66.5 kg)  08/29/23 144 lb (65.3 kg)  08/25/23 149 lb (67.6 kg)    BP Readings from Last 3 Encounters:  10/20/23 122/70  08/29/23 (!) 113/56  08/25/23 119/81     Objective:  Physical Exam Vitals and nursing note reviewed.  Constitutional:      Appearance: Normal appearance.  HENT:     Head: Normocephalic and atraumatic.  Eyes:     Extraocular Movements: Extraocular movements intact.  Cardiovascular:     Rate and Rhythm: Normal rate and regular rhythm.     Heart sounds: Normal heart sounds.  Pulmonary:     Effort: Pulmonary effort is normal.      Breath sounds: Normal breath sounds.  Musculoskeletal:     Cervical back: Normal range of motion.  Skin:    General: Skin is warm.  Neurological:     General: No focal deficit present.     Mental Status: She is alert.  Psychiatric:        Mood and Affect: Mood normal.        Behavior: Behavior normal.         Assessment And Plan:  Chronic migraine without aura without status migrainosus, not intractable Assessment & Plan: She was given samples of Ubrelvy 100mg  to take as needed for migraine headaches.  She is encouraged to let me  know if her sx improve/resolve or persist in the next week or two. Most recent Neuro note reviewed, their input is appreciated.    Type 2 diabetes mellitus with stage 3a chronic kidney disease, with long-term current use of insulin (HCC) Assessment & Plan: Chronic, last A1c >8% in December 2024. Will have her rto in March 2025 for re-evaluation. She was congratulated on her decreased intake of sodas and other sugary drinks. Encouraged to drink less than one per day. She will need to increase Ozempic to 0.5mg  weekly, Jardiance 25mg  daily and Toujeo 24 units nightly. I will need to decrease her Toujeo dose as we increase her Ozempic dosage.    Hypertensive nephropathy Assessment & Plan: Chronic,well controlled. No med changes today. She will continue with lisinopril 10mg  daily.    Iron deficiency anemia due to chronic blood loss -     CBC -     Iron, TIBC and Ferritin Panel  Other orders -     Semaglutide(0.25 or 0.5MG /DOS); Inject 0.5mg  sq weekly  Dispense: 3 mL; Refill: 1     Return for Move Feb to March 2025 - DM check.  Patient was given opportunity to ask questions. Patient verbalized understanding of the plan and was able to repeat key elements of the plan. All questions were answered to their satisfaction.   I, Heather Aliment, MD, have reviewed all documentation for this visit. The documentation on 10/20/23 for the exam, diagnosis,  procedures, and orders are all accurate and complete.   IF YOU HAVE BEEN REFERRED TO A SPECIALIST, IT MAY TAKE 1-2 WEEKS TO SCHEDULE/PROCESS THE REFERRAL. IF YOU HAVE NOT HEARD FROM US/SPECIALIST IN TWO WEEKS, PLEASE GIVE [place] A CALL AT [phone].   THE PATIENT IS ENCOURAGED TO PRACTICE SOCIAL DISTANCING DUE TO THE COVID-19 PANDEMIC.

## 2023-10-21 LAB — CBC
Hematocrit: 39.5 % (ref 34.0–46.6)
Hemoglobin: 12.4 g/dL (ref 11.1–15.9)
MCH: 26.6 pg (ref 26.6–33.0)
MCHC: 31.4 g/dL — ABNORMAL LOW (ref 31.5–35.7)
MCV: 85 fL (ref 79–97)
Platelets: 270 x10E3/uL (ref 150–450)
RBC: 4.66 x10E6/uL (ref 3.77–5.28)
RDW: 16.1 % — ABNORMAL HIGH (ref 11.7–15.4)
WBC: 6.9 x10E3/uL (ref 3.4–10.8)

## 2023-10-21 LAB — IRON,TIBC AND FERRITIN PANEL
Ferritin: 56 ng/mL (ref 15–150)
Iron Saturation: 18 % (ref 15–55)
Iron: 60 ug/dL (ref 27–139)
Total Iron Binding Capacity: 340 ug/dL (ref 250–450)
UIBC: 280 ug/dL (ref 118–369)

## 2023-10-24 ENCOUNTER — Other Ambulatory Visit: Payer: Self-pay

## 2023-10-24 MED ORDER — ALBUTEROL SULFATE 108 (90 BASE) MCG/ACT IN AEPB: INHALATION_SPRAY | RESPIRATORY_TRACT | 3 refills | Status: DC

## 2023-10-24 NOTE — Assessment & Plan Note (Signed)
She was given samples of Ubrelvy 100mg  to take as needed for migraine headaches.  She is encouraged to let me know if her sx improve/resolve or persist in the next week or two. Most recent Neuro note reviewed, their input is appreciated.

## 2023-10-24 NOTE — Assessment & Plan Note (Addendum)
Chronic, last A1c >8% in December 2024. Will have her rto in March 2025 for re-evaluation. She was congratulated on her decreased intake of sodas and other sugary drinks. Encouraged to drink less than one per day. She will need to increase Ozempic to 0.5mg  weekly, Jardiance 25mg  daily and Toujeo 24 units nightly. I will need to decrease her Toujeo dose as we increase her Ozempic dosage.

## 2023-10-24 NOTE — Assessment & Plan Note (Signed)
Chronic,well controlled. No med changes today. She will continue with lisinopril 10mg  daily.

## 2023-11-29 ENCOUNTER — Ambulatory Visit: Payer: Self-pay | Admitting: Internal Medicine

## 2023-12-20 ENCOUNTER — Ambulatory Visit: Payer: 59 | Admitting: Internal Medicine

## 2024-01-04 ENCOUNTER — Ambulatory Visit (INDEPENDENT_AMBULATORY_CARE_PROVIDER_SITE_OTHER): Payer: Self-pay | Admitting: Internal Medicine

## 2024-01-04 ENCOUNTER — Encounter: Payer: Self-pay | Admitting: Internal Medicine

## 2024-01-04 VITALS — BP 120/80 | HR 85 | Temp 98.3°F | Ht 64.0 in | Wt 152.4 lb

## 2024-01-04 DIAGNOSIS — I129 Hypertensive chronic kidney disease with stage 1 through stage 4 chronic kidney disease, or unspecified chronic kidney disease: Secondary | ICD-10-CM | POA: Diagnosis not present

## 2024-01-04 DIAGNOSIS — Z794 Long term (current) use of insulin: Secondary | ICD-10-CM

## 2024-01-04 DIAGNOSIS — E1122 Type 2 diabetes mellitus with diabetic chronic kidney disease: Secondary | ICD-10-CM | POA: Diagnosis not present

## 2024-01-04 DIAGNOSIS — J302 Other seasonal allergic rhinitis: Secondary | ICD-10-CM | POA: Diagnosis not present

## 2024-01-04 DIAGNOSIS — G43709 Chronic migraine without aura, not intractable, without status migrainosus: Secondary | ICD-10-CM

## 2024-01-04 DIAGNOSIS — G8929 Other chronic pain: Secondary | ICD-10-CM

## 2024-01-04 DIAGNOSIS — Z862 Personal history of diseases of the blood and blood-forming organs and certain disorders involving the immune mechanism: Secondary | ICD-10-CM

## 2024-01-04 DIAGNOSIS — M25511 Pain in right shoulder: Secondary | ICD-10-CM

## 2024-01-04 DIAGNOSIS — N1831 Chronic kidney disease, stage 3a: Secondary | ICD-10-CM | POA: Diagnosis not present

## 2024-01-04 MED ORDER — ALBUTEROL SULFATE HFA 108 (90 BASE) MCG/ACT IN AERS: 1.0000 | INHALATION_SPRAY | Freq: Four times a day (QID) | RESPIRATORY_TRACT | 3 refills | Status: AC | PRN

## 2024-01-04 MED ORDER — FUSION PLUS PO CAPS: 1.0000 | ORAL_CAPSULE | Freq: Every day | ORAL | 3 refills | Status: DC

## 2024-01-04 NOTE — Progress Notes (Signed)
 I,Heather Tanner, CMA,acting as a Neurosurgeon for Heather Aliment, MD.,have documented all relevant documentation on the behalf of Heather Aliment, MD,as directed by  Heather Aliment, MD while in the presence of Heather Aliment, MD.  Subjective:  Patient ID: Heather Tanner , female    DOB: Dec 04, 1955 , 68 y.o.   MRN: [id number]  Chief Complaint  Patient presents with   Diabetes    Patient presents today for diabetes & bp follow up. She reports compliance with Jardiance. She denies having any chest pain, palpitations & sob.  She has no specific questions or concerns.    Hypertension    HPI Discussed the use of AI scribe software for clinical note transcription with the patient, who gave verbal consent to proceed.  History of Present Illness Heather Tanner is a 68 year old female with diabetes and hypertension who presents for management of her chronic conditions.  She manages her diabetes with Toujeo at 24 units, and her blood glucose control is stable with the current regimen. She has no current issues with her diabetes medication.  She has hypertension and notes that her blood pressure was initially 150 mmHg, likely due to anxiety from rushing, but it decreased to 124 mmHg after resting. She is currently taking lisinopril for blood pressure management.  Her prescription for iron has expired, and she has been without it for about two weeks. Her blood count was normal in January.  She experiences migraines and has been prescribed Depakote and duloxetine. She found Ubrelvy effective, but her insurance rejected the prescription. She has not been able to use it since the samples ran out.  She has chronic pain from a rotator cuff injury that cannot be surgically repaired and may require a total shoulder replacement. She uses hydrocodone as needed for pain management, prescribed by Dr. Ethelene Hal, and has an upcoming appointment with her.  She recently celebrated a birthday with her  family, which was a surprise organized by her children. She describes it as a 'fantabulous birthday' and shares details of the event, including a surprise visit from her daughter and grandchildren from [place].   Diabetes She presents for her follow-up diabetic visit. She has type 2 diabetes mellitus. Pertinent negatives for diabetes include no blurred vision and no chest pain. There are no hypoglycemic complications. Diabetic complications include peripheral neuropathy and retinopathy. Risk factors for coronary artery disease include diabetes mellitus, dyslipidemia, hypertension, post-menopausal and sedentary lifestyle. She participates in exercise intermittently. An ACE inhibitor/angiotensin II receptor blocker is being taken.  Hypertension This is a chronic problem. The current episode started more than 1 year ago. The problem has been gradually improving since onset. The problem is controlled. Pertinent negatives include no blurred vision or chest pain. Past treatments include ACE inhibitors. Compliance problems include exercise.  Hypertensive end-organ damage includes retinopathy.     Past Medical History:  Diagnosis Date   Arthritis    Carpal tunnel syndrome 01/01/2016   right   Diabetes mellitus without complication (HCC)    High cholesterol    Hypertension      Family History  Problem Relation Age of Onset   Diabetes Mother    Heart Problems Mother    Hypertension Mother    Diabetes Father    Hypertension Father    Heart Problems Father    Hypertension Sister    Stroke Sister    Hyperlipidemia Sister    Cancer Sister        Adenocarcinoma  Heart Problems Brother    Healthy Son    Healthy Daughter    Healthy Daughter      Current Outpatient Medications:    albuterol (VENTOLIN HFA) 108 (90 Base) MCG/ACT inhaler, Inhale 1-2 puffs into the lungs every 6 (six) hours as needed for wheezing or shortness of breath., Disp: 18 g, Rfl: 3   atorvastatin (LIPITOR) 40 MG tablet, ,  Disp: , Rfl:    Calcium-Vitamin D-Vitamin K (VIACTIV PO), Take by mouth. 2 per day, Disp: , Rfl:    Continuous Glucose Sensor (DEXCOM G7 SENSOR) MISC, Use as directed, Disp: 3 each, Rfl: 11   divalproex (DEPAKOTE ER) 500 MG 24 hr tablet, Take 1 tablet (500 mg total) by mouth at bedtime., Disp: 30 tablet, Rfl: 11   DULoxetine (CYMBALTA) 60 MG capsule, TAKE 1 CAPSULE BY MOUTH DAILY, Disp: 30 capsule, Rfl: 11   empagliflozin (JARDIANCE) 25 MG TABS tablet, Take 1 tablet (25 mg total) by mouth daily before breakfast., Disp: 30 tablet, Rfl: 5   HYDROcodone-acetaminophen (NORCO) 10-325 MG tablet, Take 1 tablet by mouth 3 (three) times daily as needed., Disp: , Rfl:    insulin glargine, 1 Unit Dial, (TOUJEO SOLOSTAR) 300 UNIT/ML Solostar Pen, Inject 24 units sq nightly, max titration dose 60 units, Disp: , Rfl:    Insulin Pen Needle (PEN NEEDLES) 31G X 5 MM MISC, Inject 1 each into the skin daily., Disp: 100 each, Rfl: 2   lisinopril (ZESTRIL) 10 MG tablet, Take 1 tablet (10 mg total) by mouth daily., Disp: 90 tablet, Rfl: 2   Multiple Vitamin (MULTIVITAMIN) tablet, Take 1 tablet by mouth daily., Disp: , Rfl:    omeprazole (PRILOSEC) 20 MG capsule, 1 cap(s) orally twice a day for 14 days, Disp: , Rfl:    ondansetron (ZOFRAN) 4 MG tablet, Take 1 tablet (4 mg total) by mouth every 8 (eight) hours as needed for nausea or vomiting., Disp: 20 tablet, Rfl: 6   OVER THE COUNTER MEDICATION, Total beet chew, Disp: , Rfl:    pantoprazole (PROTONIX) 40 MG tablet, Take 1 tablet (40 mg total) by mouth daily., Disp: 90 tablet, Rfl: 1   Semaglutide,0.25 or 0.5MG /DOS, 2 MG/3ML SOPN, Inject 0.5mg  sq weekly, Disp: 3 mL, Rfl: 1   SUMAtriptan (IMITREX) 50 MG tablet, May repeat in 2 hours if headache persists or recurs., Disp: 10 tablet, Rfl: 5   tiZANidine (ZANAFLEX) 4 MG tablet, Take 1 tablet (4 mg total) by mouth every 6 (six) hours as needed for muscle spasms., Disp: 30 tablet, Rfl: 6   Turmeric-Ginger 150-25 MG CHEW, Chew  by mouth., Disp: , Rfl:    Ubrogepant (UBRELVY) 100 MG TABS, One tab po every day prn, Disp: 12 tablet, Rfl: 11   Iron-FA-B Cmp-C-Biot-Probiotic (FUSION PLUS) CAPS, Take 1 capsule by mouth daily., Disp: 30 capsule, Rfl: 3   Allergies  Allergen Reactions   Latex      Review of Systems  Constitutional: Negative.   Eyes:  Negative for blurred vision.  Respiratory: Negative.    Cardiovascular: Negative.  Negative for chest pain.  Gastrointestinal: Negative.   Neurological: Negative.   Psychiatric/Behavioral: Negative.       Today's Vitals   01/04/24 1543  BP: 120/80  Pulse: 85  Temp: 98.3 F (36.8 C)  SpO2: 98%  Weight: 152 lb 6.4 oz (69.1 kg)  Height: 5\' 4"  (1.626 m)   Body mass index is 26.16 kg/m.  Wt Readings from Last 3 Encounters:  01/04/24 152 lb 6.4 oz (  69.1 kg)  10/20/23 146 lb 9.6 oz (66.5 kg)  08/29/23 144 lb (65.3 kg)     Objective:  Physical Exam Vitals and nursing note reviewed.  Constitutional:      Appearance: Normal appearance.  HENT:     Head: Normocephalic and atraumatic.  Eyes:     Extraocular Movements: Extraocular movements intact.  Cardiovascular:     Rate and Rhythm: Normal rate and regular rhythm.     Heart sounds: Normal heart sounds.  Pulmonary:     Effort: Pulmonary effort is normal.     Breath sounds: Normal breath sounds.  Musculoskeletal:     Cervical back: Normal range of motion.  Skin:    General: Skin is warm.  Neurological:     General: No focal deficit present.     Mental Status: She is alert.  Psychiatric:        Mood and Affect: Mood normal.        Behavior: Behavior normal.         Assessment And Plan:  Type 2 diabetes mellitus with stage 3a chronic kidney disease, with long-term current use of insulin (HCC) Assessment & Plan: Chronic, she will continue with Toujeo 24 units nightly, semaglutide 0.5mg  weekly and Jardiance 25mg  daily.   I will check labs as below. She will rto in 3 months for re-evaluation.    Orders: -     CMP14+EGFR -     Hemoglobin A1c  Hypertensive nephropathy Assessment & Plan: Chronic,well controlled. No med changes today. She will continue with lisinopril 10mg  daily.    Seasonal allergies Assessment & Plan: Uses inhaler for seasonal allergies and colds. Proair expired; Ventolin prescribed due to insurance changes. - Send prescription for Ventolin inhaler.   Chronic migraine without aura without status migrainosus, not intractable Assessment & Plan: Most recent Neuro notes reviewed. Ubrelvy samples effective but insurance denied coverage. - Provide samples of Ubrelvy if available.   Chronic right shoulder pain Assessment & Plan: Chronic pain from non-repairable rotator cuff injury. She may need possible total shoulder replacement. Hydrocodone prescribed by Dr. Ethelene Hal. - Continue hydrocodone as needed. - Follow up with Dr. Ethelene Hal for pain management and prescription monitoring.   History of iron deficiency anemia Assessment & Plan: January blood count normal. Out of supplements for two weeks. Continued supplementation may not be necessary if stable. - Send prescription for Fusion Plus. - Provide samples of Fusion Plus if available. - Monitor blood count to determine need for continued iron supplementation.  Orders: -     CBC -     Iron, TIBC and Ferritin Panel  Other orders -     Albuterol Sulfate HFA; Inhale 1-2 puffs into the lungs every 6 (six) hours as needed for wheezing or shortness of breath.  Dispense: 18 g; Refill: 3 -     Fusion Plus; Take 1 capsule by mouth daily.  Dispense: 30 capsule; Refill: 3    Return if symptoms worsen or fail to improve.  Patient was given opportunity to ask questions. Patient verbalized understanding of the plan and was able to repeat key elements of the plan. All questions were answered to their satisfaction.    I, Heather Aliment, MD, have reviewed all documentation for this visit. The documentation on 01/04/24  for the exam, diagnosis, procedures, and orders are all accurate and complete.   IF YOU HAVE BEEN REFERRED TO A SPECIALIST, IT MAY TAKE 1-2 WEEKS TO SCHEDULE/PROCESS THE REFERRAL. IF YOU HAVE NOT HEARD FROM US/SPECIALIST  IN TWO WEEKS, PLEASE GIVE [place] A CALL AT [phone].   THE PATIENT IS ENCOURAGED TO PRACTICE SOCIAL DISTANCING DUE TO THE COVID-19 PANDEMIC.

## 2024-01-04 NOTE — Patient Instructions (Signed)

## 2024-01-05 ENCOUNTER — Ambulatory Visit: Admitting: Internal Medicine

## 2024-01-05 LAB — IRON,TIBC AND FERRITIN PANEL
Ferritin: 72 ng/mL (ref 15–150)
Iron Saturation: 26 % (ref 15–55)
Iron: 85 ug/dL (ref 27–139)
Total Iron Binding Capacity: 327 ug/dL (ref 250–450)
UIBC: 242 ug/dL (ref 118–369)

## 2024-01-05 LAB — CMP14+EGFR
ALT: 26 IU/L (ref 0–32)
AST: 26 IU/L (ref 0–40)
Albumin: 4.5 g/dL (ref 3.9–4.9)
Alkaline Phosphatase: 58 IU/L (ref 44–121)
BUN/Creatinine Ratio: 13 (ref 12–28)
BUN: 14 mg/dL (ref 8–27)
Bilirubin Total: <0.2 mg/dL (ref 0.0–1.2)
CO2: 26 mmol/L (ref 20–29)
Calcium: 9.7 mg/dL (ref 8.7–10.3)
Chloride: 99 mmol/L (ref 96–106)
Creatinine, Ser: 1.05 mg/dL — ABNORMAL HIGH (ref 0.57–1.00)
Globulin, Total: 2.6 g/dL (ref 1.5–4.5)
Glucose: 92 mg/dL (ref 70–99)
Potassium: 4.4 mmol/L (ref 3.5–5.2)
Sodium: 140 mmol/L (ref 134–144)
Total Protein: 7.1 g/dL (ref 6.0–8.5)
eGFR: 57 mL/min/{1.73_m2} — ABNORMAL LOW (ref >59)

## 2024-01-05 LAB — CBC
Hematocrit: 38.5 % (ref 34.0–46.6)
Hemoglobin: 12.5 g/dL (ref 11.1–15.9)
MCH: 28.4 pg (ref 26.6–33.0)
MCHC: 32.5 g/dL (ref 31.5–35.7)
MCV: 88 fL (ref 79–97)
Platelets: 231 10*3/uL (ref 150–450)
RBC: 4.4 x10E6/uL (ref 3.77–5.28)
RDW: 13.7 % (ref 11.7–15.4)
WBC: 5.9 10*3/uL (ref 3.4–10.8)

## 2024-01-05 LAB — HEMOGLOBIN A1C
Est. average glucose Bld gHb Est-mCnc: 146 mg/dL
Hgb A1c MFr Bld: 6.7 % — ABNORMAL HIGH (ref 4.8–5.6)

## 2024-01-07 ENCOUNTER — Encounter: Payer: Self-pay | Admitting: Internal Medicine

## 2024-01-07 DIAGNOSIS — Z862 Personal history of diseases of the blood and blood-forming organs and certain disorders involving the immune mechanism: Secondary | ICD-10-CM | POA: Insufficient documentation

## 2024-01-07 DIAGNOSIS — J302 Other seasonal allergic rhinitis: Secondary | ICD-10-CM | POA: Insufficient documentation

## 2024-01-07 DIAGNOSIS — G8929 Other chronic pain: Secondary | ICD-10-CM | POA: Insufficient documentation

## 2024-01-07 NOTE — Assessment & Plan Note (Signed)
 Chronic,well controlled. No med changes today. She will continue with lisinopril 10mg  daily.

## 2024-01-07 NOTE — Assessment & Plan Note (Signed)
 Chronic, she will continue with Toujeo 24 units nightly, semaglutide 0.5mg  weekly and Jardiance 25mg  daily.   I will check labs as below. She will rto in 3 months for re-evaluation.

## 2024-01-07 NOTE — Assessment & Plan Note (Signed)
 Most recent Neuro notes reviewed. Ubrelvy samples effective but insurance denied coverage. - Provide samples of Ubrelvy if available.

## 2024-01-07 NOTE — Assessment & Plan Note (Signed)
 Chronic pain from non-repairable rotator cuff injury. She may need possible total shoulder replacement. Hydrocodone prescribed by Dr. Ethelene Hal. - Continue hydrocodone as needed. - Follow up with Dr. Ethelene Hal for pain management and prescription monitoring.

## 2024-01-07 NOTE — Assessment & Plan Note (Signed)
 January blood count normal. Out of supplements for two weeks. Continued supplementation may not be necessary if stable. - Send prescription for Fusion Plus. - Provide samples of Fusion Plus if available. - Monitor blood count to determine need for continued iron supplementation.

## 2024-01-07 NOTE — Assessment & Plan Note (Signed)
 Uses inhaler for seasonal allergies and colds. Proair expired; Ventolin prescribed due to insurance changes. - Send prescription for Ventolin inhaler.

## 2024-01-18 ENCOUNTER — Other Ambulatory Visit: Payer: Self-pay | Admitting: Internal Medicine

## 2024-01-24 NOTE — Progress Notes (Unsigned)
 ASSESSMENT AND PLAN  Heather Tanner is a 68 y.o. female    Persistent daily headaches  Continued 3-4 migraines per month and daily milder headaches  Recommend initiating topiramate  25 mg twice daily for 1 week then increase to 50 mg twice daily.  Discussed potential side effects and advised to call with any difficulty tolerating.  Can consider further dosage increase in the future if needed.  If unable to tolerate, consider trial of propranolol.  She is currently on duloxetine  for diffuse bodyaches.  Advised to discontinue Depakote  after 1 week of initiating topiramate  due to lack of benefit  Start rizatriptan  as needed for migraine rescue, can repeat x1 after 2 hours if needed.  If no benefit, will retry approval for Ubrelvy  as this was beneficial.  Continue tizanidine  and Zofran  for more prolonged severe headaches  Previously tried/failed: Duloxetine , Depakote , topiramate , sumatriptan  MRI brain 09/2023 normal  History of migraine, develop medication overuse headache but more than 40 years history of daily multiple dose BC powder use, gastric ulcer, denies continued use at this time    Diffuse body achy pain  Continue duloxetine  60 mg nightly Normal EMG nerve conduction study in December 2022, no evidence of large fiber peripheral neuropathy,  CPK, C-reactive protein, ESR showed no significant abnormality, no evidence of polymyalgia rheumatica      Follow-up in 6 to 8 months or call earlier if needed      DIAGNOSTIC DATA (LABS, IMAGING, TESTING) - I reviewed patient records, labs, notes, testing and imaging myself where available.   MEDICAL HISTORY:  Update 01/25/2024 JM: Patient returns for follow-up visit.  Reports currently having about 3-4 migraines per month although daily headaches persist and has worsened recently due to allergies and pollen.  She remains on Depakote  ER 500 mg nightly and tolerating well although no significant benefit.  Reports improvement of  migraines with use of Ubrelvy  (samples) but was denied by insurance as she was required to fail to triptans, she was prescribed sumatriptan  but denies benefit.  Also remains on Zofran  and tizanidine  for more severe prolonged headaches.  Denies daily OTC medication use.  She also remains on duloxetine  for diffuse body pains which continues to be beneficial.      UPDATE Aug 25 2023 Dr. Gracie Lav: Heather Tanner had long history of migraine headaches, she began to self treated with BC powder, daily multiple dose of BC powders since 1970s, hospital admission in August 2024 for looking pale, abdominal pain, endoscopy showed nonbleeding gastric ulcer, discharged with PPI, stopped daily Goody powder use  Since then, she began to have worsening daily headaches, lateralized retro-orbital headache, sometimes with light noise sensitivity, nauseous, lasting for days, she has tried Nurtec with suboptimal response, now on Ubrelvy  100 mg at as needed it seems to help her better, but not eliminate headaches, often her headache is proceeded by blurry vision, sometimes flashing light  Laboratory evaluation in November 2024 A1c 10.2, she likes to drink Pepsi, creatinine was normal at 0.98, hemoglobin in September was 9.4   UPDATE Oct 26 2021 Dr. Gracie Lav: Cymbalta  60 mg daily since last visit in December 2022, she tolerated well, reported significant improvement, no longer has diffuse body achy pain,  She can ambulate without much difficulty, but still has intermittent bilateral fingertips and toes paresthesia, no significant neck pain, occasionally low back pain, no bowel bladder incontinence  Laboratory evaluations in December 2022, normal negative CPK, ANA, ESR, B12, C-reactive protein, A1c was 6.9, negative RPR, hepatitis C, Lyme titer, protein  electrophoresis, copper,   Consult visit 09/09/2021 Dr. Gracie Lav: Heather Tanner is a 68 year old female,, seen in request by her primary care physician Dr. Elnita Hai, [facility], for  evaluation of intermittent hands and feet paresthesia, diffuse body achy pain, was seen by Dr. Tilda Fogo, retired neurologist in September 2022, who ordered EMG nerve conduction study.  This is the first time I see her at the clinic.  I reviewed and summarized the referring note.PMHx: HLD HTN DM  Patient complains of more than 5 years history of intermittent bilateral hands and feet paresthesia, had nerve conduction study in 2017 that was normal, patient reported throughout the day, sometimes she has fingers, toes numbness, lasting 10 to 15 minutes, she has to work her hands to get the sensation comes back  In addition she complains of diffuse body achy pain, but denies functional limitation, works full-time job, denies gait abnormality.  She is also taking Lyrica  100 mg 3 times a day for low back pain, followed by pain management Dr. Rexanne Catalina.  Extensive laboratory evaluation reviewed, elevated A1c 6.9, creatinine 1.06, negative hepatitis C antibody, Treponema pallidum antibody was negative, Lyme titer, protein electrophoresis, copper, ESR B12, lipid panel, rheumatoid factor, ANA, was within normal limit.  Today's EMG nerve conduction study was normal,    PHYSICAL EXAM:   Vitals:   01/25/24 0743  BP: 119/70  Pulse: 86  Weight: 151 lb (68.5 kg)  Height: 5\' 4"  (1.626 m)   Body mass index is 25.92 kg/m.  PHYSICAL EXAMNIATION:  Gen: NAD, very pleasant elderly African-American female, conversant, well nourised, well groomed        MENTAL STATUS: Speech/cognition: Awake, alert, oriented to history taking and casual conversation.   CRANIAL NERVES: CN II: Visual fields are full to confrontation. Pupils are round equal and briskly reactive to light.  Funduscopy examination showed sharp disc, thinning anterior CN III, IV, VI: extraocular movement are normal. No ptosis. CN V: Facial sensation is intact to light touch CN VII: Face is symmetric with normal eye closure  CN VIII: Hearing is  normal to causal conversation. CN IX, X: Phonation is normal. CN XI: Head turning and shoulder shrug are intact  MOTOR: There is no pronator drift of out-stretched arms. Muscle bulk and tone are normal. Muscle strength is normal.  REFLEXES: Reflexes are 1 and symmetric at the biceps, triceps, knees, and ankles. Plantar responses are flexor.  SENSORY: Intact to light touch, pinprick and vibratory sensation are intact in fingers and toes.  COORDINATION: There is no trunk or limb dysmetria noted.  GAIT/STANCE: Gait is steady with normal      REVIEW OF SYSTEMS:  Full 14 system review of systems performed and notable only for as above All other review of systems were negative.   ALLERGIES: Allergies  Allergen Reactions   Latex     HOME MEDICATIONS: Current Outpatient Medications  Medication Sig Dispense Refill   albuterol  (VENTOLIN  HFA) 108 (90 Base) MCG/ACT inhaler Inhale 1-2 puffs into the lungs every 6 (six) hours as needed for wheezing or shortness of breath. 18 g 3   atorvastatin  (LIPITOR) 40 MG tablet      Calcium -Vitamin D-Vitamin K (VIACTIV PO) Take by mouth. 2 per day     Continuous Glucose Sensor (DEXCOM G7 SENSOR) MISC Use as directed 3 each 11   divalproex  (DEPAKOTE  ER) 500 MG 24 hr tablet Take 1 tablet (500 mg total) by mouth at bedtime. 30 tablet 11   DULoxetine  (CYMBALTA ) 60 MG capsule TAKE 1 CAPSULE BY  MOUTH DAILY 30 capsule 11   empagliflozin  (JARDIANCE ) 25 MG TABS tablet Take 1 tablet (25 mg total) by mouth daily before breakfast. 30 tablet 5   HYDROcodone -acetaminophen  (NORCO) 10-325 MG tablet Take 1 tablet by mouth 3 (three) times daily as needed.     insulin  glargine, 1 Unit Dial , (TOUJEO  SOLOSTAR) 300 UNIT/ML Solostar Pen Inject 24 units sq nightly, max titration dose 60 units     Insulin  Pen Needle (PEN NEEDLES) 31G X 5 MM MISC Inject 1 each into the skin daily. 100 each 2   Iron-FA-B Cmp-C-Biot-Probiotic (FUSION PLUS) CAPS Take 1 capsule by mouth daily.  30 capsule 3   lisinopril  (ZESTRIL ) 10 MG tablet Take 1 tablet (10 mg total) by mouth daily. 90 tablet 2   Multiple Vitamin (MULTIVITAMIN) tablet Take 1 tablet by mouth daily.     ondansetron  (ZOFRAN ) 4 MG tablet Take 1 tablet (4 mg total) by mouth every 8 (eight) hours as needed for nausea or vomiting. 20 tablet 6   OVER THE COUNTER MEDICATION Total beet chew     pantoprazole  (PROTONIX ) 40 MG tablet Take 1 tablet (40 mg total) by mouth daily. 90 tablet 1   Semaglutide ,0.25 or 0.5MG /DOS, 2 MG/3ML SOPN Inject 0.5mg  sq weekly 3 mL 1   SUMAtriptan  (IMITREX ) 50 MG tablet May repeat in 2 hours if headache persists or recurs. 10 tablet 5   tiZANidine  (ZANAFLEX ) 4 MG tablet Take 1 tablet (4 mg total) by mouth every 6 (six) hours as needed for muscle spasms. 30 tablet 6   Turmeric-Ginger 150-25 MG CHEW Chew by mouth.     No current facility-administered medications for this visit.    PAST MEDICAL HISTORY: Past Medical History:  Diagnosis Date   Arthritis    Carpal tunnel syndrome 01/01/2016   right   Diabetes mellitus without complication (HCC)    High cholesterol    Hypertension     PAST SURGICAL HISTORY: Past Surgical History:  Procedure Laterality Date   BIOPSY  06/01/2023   Procedure: BIOPSY;  Surgeon: Alvis Jourdain, MD;  Location: [facility] ENDOSCOPY;  Service: Gastroenterology;;   CHOLECYSTECTOMY     ESOPHAGOGASTRODUODENOSCOPY (EGD) WITH PROPOFOL  N/A 06/01/2023   Procedure: ESOPHAGOGASTRODUODENOSCOPY (EGD) WITH PROPOFOL ;  Surgeon: Alvis Jourdain, MD;  Location: [facility] ENDOSCOPY;  Service: Gastroenterology;  Laterality: N/A;   KNEE ARTHROSCOPY Right 1995   TONSILLECTOMY     TUBAL LIGATION  1986    FAMILY HISTORY: Family History  Problem Relation Age of Onset   Diabetes Mother    Heart Problems Mother    Hypertension Mother    Diabetes Father    Hypertension Father    Heart Problems Father    Hypertension Sister    Stroke Sister    Hyperlipidemia Sister    Cancer Sister         Adenocarcinoma   Heart Problems Brother    Healthy Son    Healthy Daughter    Healthy Daughter     SOCIAL HISTORY: Social History   Socioeconomic History   Marital status: Divorced    Spouse name: Not on file   Number of children: Not on file   Years of education: Not on file   Highest education level: Associate degree: occupational, Scientist, product/process development, or vocational program  Occupational History   Not on file  Tobacco Use   Smoking status: Never   Smokeless tobacco: Never  Vaping Use   Vaping status: Never Used  Substance and Sexual Activity   Alcohol use: Yes  Comment: 3 drinks monthly   Drug use: Never   Sexual activity: Not on file  Other Topics Concern   Not on file  Social History Narrative   Not on file   Social Drivers of Health   Financial Resource Strain: Low Risk  (10/17/2023)   Overall Financial Resource Strain (CARDIA)    Difficulty of Paying Living Expenses: Not hard at all  Food Insecurity: No Food Insecurity (10/17/2023)   Hunger Vital Sign    Worried About Running Out of Food in the Last Year: Never true    Ran Out of Food in the Last Year: Never true  Transportation Needs: No Transportation Needs (10/17/2023)   PRAPARE - Administrator, Civil Service (Medical): No    Lack of Transportation (Non-Medical): No  Physical Activity: Inactive (10/17/2023)   Exercise Vital Sign    Days of Exercise per Week: 0 days    Minutes of Exercise per Session: 10 min  Stress: No Stress Concern Present (10/17/2023)   [employer] of Occupational Health - Occupational Stress Questionnaire    Feeling of Stress : Not at all  Social Connections: Moderately Integrated (10/17/2023)   Social Connection and Isolation Panel [NHANES]    Frequency of Communication with Friends and Family: More than three times a week    Frequency of Social Gatherings with Friends and Family: More than three times a week    Attends Religious Services: 1 to 4 times per year    Active  Member of [employer] or Organizations: Yes    Attends Banker Meetings: More than 4 times per year    Marital Status: Divorced  Intimate Partner Violence: Not At Risk (05/31/2023)   Humiliation, Afraid, Rape, and Kick questionnaire    Fear of Current or Ex-Partner: No    Emotionally Abused: No    Physically Abused: No    Sexually Abused: No      I spent 30 minutes of face-to-face and non-face-to-face time with patient.  This included previsit chart review, lab review, study review, order entry, electronic health record documentation, patient education and discussion regarding above diagnoses and treatment plan and answered all other questions to patient's satisfaction  Johny Nap, [facility]  [facility] [address] [place], [place] [address]  Phone [phone] Fax [phone] Note: This document was prepared with digital dictation and possible smart phrase technology. Any transcriptional errors that result from this process are unintentional.

## 2024-01-25 ENCOUNTER — Ambulatory Visit: Payer: 59 | Admitting: Adult Health

## 2024-01-25 ENCOUNTER — Encounter: Payer: Self-pay | Admitting: Adult Health

## 2024-01-25 VITALS — BP 119/70 | HR 86 | Ht 64.0 in | Wt 151.0 lb

## 2024-01-25 DIAGNOSIS — R202 Paresthesia of skin: Secondary | ICD-10-CM | POA: Diagnosis not present

## 2024-01-25 DIAGNOSIS — G43E19 Chronic migraine with aura, intractable, without status migrainosus: Secondary | ICD-10-CM | POA: Diagnosis not present

## 2024-01-25 DIAGNOSIS — R52 Pain, unspecified: Secondary | ICD-10-CM | POA: Diagnosis not present

## 2024-01-25 MED ORDER — ONDANSETRON 4 MG PO TBDP: 4.0000 mg | ORAL_TABLET | Freq: Three times a day (TID) | ORAL | 11 refills | Status: AC | PRN

## 2024-01-25 MED ORDER — TOPIRAMATE 25 MG PO TABS: 50.0000 mg | ORAL_TABLET | Freq: Two times a day (BID) | ORAL | 5 refills | Status: DC

## 2024-01-25 MED ORDER — TIZANIDINE HCL 4 MG PO TABS: 4.0000 mg | ORAL_TABLET | Freq: Two times a day (BID) | ORAL | 11 refills | Status: AC | PRN

## 2024-01-25 MED ORDER — RIZATRIPTAN BENZOATE 10 MG PO TBDP: 10.0000 mg | ORAL_TABLET | ORAL | 11 refills | Status: DC | PRN

## 2024-01-25 NOTE — Patient Instructions (Addendum)
 Your Plan:  Start topamax  25mg  (1 tab) twice daily for 1 week then increase to 50mg  (2 tabs) twice daily   Discontinue Depakote  after 1 week of being on topamax  as no benefit  Start rizatriptan  as needed for rescue, can repeat x1 after 2 hrs if needed  If no benefit with rizatriptan , please let me know and we can pursue Ubrelvy    Continue duloxetine  60mg  nightly         Follow up in 6-8 months or call earlier if needed      Thank you for coming to see us  at [facility]. I hope we have been able to provide you high quality care today.  You may receive a patient satisfaction survey over the next few weeks. We would appreciate your feedback and comments so that we may continue to improve ourselves and the health of our patients.

## 2024-01-31 ENCOUNTER — Other Ambulatory Visit: Payer: Self-pay | Admitting: Internal Medicine

## 2024-02-28 ENCOUNTER — Other Ambulatory Visit: Payer: Self-pay | Admitting: Internal Medicine

## 2024-03-06 ENCOUNTER — Ambulatory Visit
Admission: RE | Admit: 2024-03-06 | Discharge: 2024-03-06 | Disposition: A | Discharge status: Home / Self Care | Payer: 59 | Source: Ambulatory Visit | Attending: Internal Medicine | Admitting: Internal Medicine

## 2024-03-06 DIAGNOSIS — E2839 Other primary ovarian failure: Secondary | ICD-10-CM

## 2024-03-06 LAB — GLUCOSE, POCT (MANUAL RESULT ENTRY): Glucose Fasting, POC: 142 mg/dL — AB (ref 70–99)

## 2024-03-06 NOTE — Progress Notes (Unsigned)
 Pt was seen on 6/32025 at screening event, blood glucose was 142. Pt indicated she has a PCP. Pt does not smoke and does not live with anyone who smokes. Pt was given a lifestyle med packet. No SDOH needs were indicated at this time

## 2024-03-07 ENCOUNTER — Ambulatory Visit: Payer: Self-pay | Admitting: Internal Medicine

## 2024-04-25 ENCOUNTER — Ambulatory Visit: Admitting: Internal Medicine

## 2024-04-25 ENCOUNTER — Encounter: Payer: Self-pay | Admitting: Internal Medicine

## 2024-04-25 ENCOUNTER — Ambulatory Visit: Payer: Self-pay | Admitting: Internal Medicine

## 2024-04-25 VITALS — BP 150/90 | HR 80 | Temp 97.9°F | Ht 64.0 in | Wt 153.6 lb

## 2024-04-25 DIAGNOSIS — E1122 Type 2 diabetes mellitus with diabetic chronic kidney disease: Secondary | ICD-10-CM | POA: Diagnosis not present

## 2024-04-25 DIAGNOSIS — Z794 Long term (current) use of insulin: Secondary | ICD-10-CM

## 2024-04-25 DIAGNOSIS — G43709 Chronic migraine without aura, not intractable, without status migrainosus: Secondary | ICD-10-CM

## 2024-04-25 DIAGNOSIS — E1141 Type 2 diabetes mellitus with diabetic mononeuropathy: Secondary | ICD-10-CM

## 2024-04-25 DIAGNOSIS — I129 Hypertensive chronic kidney disease with stage 1 through stage 4 chronic kidney disease, or unspecified chronic kidney disease: Secondary | ICD-10-CM

## 2024-04-25 DIAGNOSIS — S46011S Strain of muscle(s) and tendon(s) of the rotator cuff of right shoulder, sequela: Secondary | ICD-10-CM

## 2024-04-25 DIAGNOSIS — Z0000 Encounter for general adult medical examination without abnormal findings: Secondary | ICD-10-CM | POA: Diagnosis not present

## 2024-04-25 DIAGNOSIS — Z862 Personal history of diseases of the blood and blood-forming organs and certain disorders involving the immune mechanism: Secondary | ICD-10-CM

## 2024-04-25 DIAGNOSIS — N1831 Chronic kidney disease, stage 3a: Secondary | ICD-10-CM

## 2024-04-25 LAB — POCT URINALYSIS DIP (CLINITEK)
Bilirubin, UA: NEGATIVE
Blood, UA: NEGATIVE
Glucose, UA: >=1000 mg/dL — AB
Leukocytes, UA: NEGATIVE
Nitrite, UA: NEGATIVE
POC PROTEIN,UA: NEGATIVE
Spec Grav, UA: 1.02 (ref 1.010–1.025)
Urobilinogen, UA: 0.2 U/dL
pH, UA: 5.5 (ref 5.0–8.0)

## 2024-04-25 MED ORDER — SEMAGLUTIDE(0.25 OR 0.5MG/DOS) 2 MG/3ML ~~LOC~~ SOPN: PEN_INJECTOR | SUBCUTANEOUS | 3 refills | Status: DC

## 2024-04-25 NOTE — Progress Notes (Signed)
 I,[name], CMA,acting as a Neurosurgeon for [name], MD.,have documented all relevant documentation on the behalf of [name], MD,as directed by  [name], MD while in the presence of [name], MD.  Subjective:    Patient ID: Heather Tanner , female    DOB: [birth year] , 68 y.o.   MRN: [id number]  Chief Complaint  Patient presents with   Annual Exam    Patient presents today for annual exam. She reports compliance with medications. Denies headache, chest pain & sob. She reports stopping Topiramate  a month ago. She cannot stay out of the restroom while on medication.    Hypertension   Diabetes    HPI Discussed the use of AI scribe software for clinical note transcription with the patient, who gave verbal consent to proceed.  History of Present Illness Heather Tanner is a 68 year old female with diabetes who presents for a physical and diabetes check.  She experiences approximately fifteen headaches per month, with ten severe enough to be considered migraines. She previously used topiramate  but discontinued it due to gastrointestinal side effects, noticing no change in headache frequency. She currently uses Ubrelvy  for migraines, which she finds effective, though it is not covered by her insurance. She has not been using Maxalt  as prescribed. Some headaches are attributed to sinus issues.  She has diabetes and uses insulin  (Toujeo ) at 24 units nightly, with morning blood sugars around 137-140 mg/dL. She also uses Ozempic , though her insurance has not covered it recently. She experiences occasional hypoglycemia, with the lowest blood sugar recorded at 66 mg/dL. Her current medications include lisinopril , Jardiance , and pantoprazole . She has run out of iron supplements but reports no change in energy levels.  She experiences all-over pain, particularly in her legs, sometimes radiating from her back down the back of her legs. She takes Cymbalta  for this  pain. She also reports numbness and tingling in her toes and is not currently on medication for it.  She has a history of a fall in November, resulting in a shoulder injury that may require total shoulder replacement. She received a cortisone shot in February, which provided temporary relief.  She engages in regular physical activity by walking during her lunch hour, weather permitting. Despite running out of iron supplements, she denies any significant change in energy levels. She reports numbness and tingling in her toes but denies it affecting her sleep.   Diabetes She presents for her follow-up diabetic visit. She has type 2 diabetes mellitus. Hypoglycemia symptoms include headaches. Pertinent negatives for diabetes include no blurred vision and no chest pain. There are no hypoglycemic complications. Diabetic complications include peripheral neuropathy and retinopathy. Risk factors for coronary artery disease include diabetes mellitus, dyslipidemia, hypertension, post-menopausal and sedentary lifestyle. She participates in exercise intermittently. An ACE inhibitor/angiotensin II receptor blocker is being taken.  Hypertension This is a chronic problem. The current episode started more than 1 year ago. The problem has been gradually improving since onset. The problem is controlled. Associated symptoms include headaches. Pertinent negatives include no blurred vision or chest pain. Past treatments include ACE inhibitors. Compliance problems include exercise.  Hypertensive end-organ damage includes retinopathy.     Past Medical History:  Diagnosis Date   Arthritis    Carpal tunnel syndrome 01/01/2016   right   Diabetes mellitus without complication (HCC)    High cholesterol    Hypertension      Family History  Problem Relation Age of Onset  Diabetes Mother    Heart Problems Mother    Hypertension Mother    Diabetes Father    Hypertension Father    Heart Problems Father    Hypertension  Sister    Stroke Sister    Hyperlipidemia Sister    Cancer Sister        Adenocarcinoma   Heart Problems Brother    Healthy Son    Healthy Daughter    Healthy Daughter      Current Outpatient Medications:    albuterol  (VENTOLIN  HFA) 108 (90 Base) MCG/ACT inhaler, Inhale 1-2 puffs into the lungs every 6 (six) hours as needed for wheezing or shortness of breath., Disp: 18 g, Rfl: 3   atorvastatin  (LIPITOR) 40 MG tablet, , Disp: , Rfl:    Calcium -Vitamin D-Vitamin K (VIACTIV PO), Take by mouth. 2 per day, Disp: , Rfl:    Continuous Glucose Sensor (DEXCOM G7 SENSOR) MISC, Use as directed, Disp: 3 each, Rfl: 11   divalproex  (DEPAKOTE  ER) 500 MG 24 hr tablet, Take 1 tablet (500 mg total) by mouth at bedtime., Disp: 30 tablet, Rfl: 11   DULoxetine  (CYMBALTA ) 60 MG capsule, TAKE 1 CAPSULE BY MOUTH DAILY, Disp: 30 capsule, Rfl: 11   insulin  glargine, 1 Unit Dial , (TOUJEO  SOLOSTAR) 300 UNIT/ML Solostar Pen, Inject 24 units sq nightly, max titration dose 60 units, Disp: , Rfl:    Insulin  Pen Needle (PEN NEEDLES) 31G X 5 MM MISC, Inject 1 each into the skin daily., Disp: 100 each, Rfl: 2   Iron-FA-B Cmp-C-Biot-Probiotic (FUSION PLUS) CAPS, Take 1 capsule by mouth daily., Disp: 30 capsule, Rfl: 3   JARDIANCE  25 MG TABS tablet, TAKE 1 TABLET BY MOUTH DAILY BEFORE BREAKFAST, Disp: 30 tablet, Rfl: 5   lisinopril  (ZESTRIL ) 10 MG tablet, Take 1 tablet (10 mg total) by mouth daily., Disp: 90 tablet, Rfl: 2   Multiple Vitamin (MULTIVITAMIN) tablet, Take 1 tablet by mouth daily., Disp: , Rfl:    ondansetron  (ZOFRAN -ODT) 4 MG disintegrating tablet, Take 1 tablet (4 mg total) by mouth every 8 (eight) hours as needed for nausea or vomiting., Disp: 20 tablet, Rfl: 11   OVER THE COUNTER MEDICATION, Total beet chew, Disp: , Rfl:    pantoprazole  (PROTONIX ) 40 MG tablet, TAKE 1 TABLET BY MOUTH DAILY, Disp: 90 tablet, Rfl: 1   rizatriptan  (MAXALT -MLT) 10 MG disintegrating tablet, Take 1 tablet (10 mg total) by mouth  as needed for migraine. May repeat in 2 hours if needed, Disp: 9 tablet, Rfl: 11   tiZANidine  (ZANAFLEX ) 4 MG tablet, Take 1 tablet (4 mg total) by mouth 2 (two) times daily as needed for muscle spasms., Disp: 30 tablet, Rfl: 11   Turmeric-Ginger 150-25 MG CHEW, Chew by mouth., Disp: , Rfl:    Semaglutide ,0.25 or 0.5MG /DOS, 2 MG/3ML SOPN, Inject 0.5mg  sq weekly, Disp: 3 mL, Rfl: 3   Allergies  Allergen Reactions   Latex       The patient states she uses post menopausal status for birth control. No LMP recorded. Patient is postmenopausal.. Negative for Dysmenorrhea. Negative for: breast discharge, breast lump(s), breast pain and breast self exam. Associated symptoms include abnormal vaginal bleeding. Pertinent negatives include abnormal bleeding (hematology), anxiety, decreased libido, depression, difficulty falling sleep, dyspareunia, history of infertility, nocturia, sexual dysfunction, sleep disturbances, urinary incontinence, urinary urgency, vaginal discharge and vaginal itching. Diet regular.The patient states her exercise level is  moderate.  . The patient's tobacco use is:  Social History   Tobacco Use  Smoking Status  Never  Smokeless Tobacco Never  . She has been exposed to passive smoke. The patient's alcohol use is:  Social History   Substance and Sexual Activity  Alcohol Use Yes   Comment: 3 drinks monthly    Review of Systems  Constitutional: Negative.   HENT: Negative.    Eyes: Negative.  Negative for blurred vision.  Respiratory: Negative.    Cardiovascular: Negative.  Negative for chest pain.  Gastrointestinal: Negative.   Endocrine: Negative.   Genitourinary: Negative.   Musculoskeletal: Negative.   Skin: Negative.   Allergic/Immunologic: Negative.   Neurological:  Positive for headaches.  Hematological: Negative.   Psychiatric/Behavioral: Negative.       Today's Vitals   04/25/24 1524 04/25/24 1602  BP: (!) 160/90 (!) 150/90  Pulse: 80   Temp: 97.9 F  (36.6 C)   SpO2: 98%   Weight: 153 lb 9.6 oz (69.7 kg)   Height: 5' 4 (1.626 m)    Body mass index is 26.37 kg/m.  Wt Readings from Last 3 Encounters:  04/25/24 153 lb 9.6 oz (69.7 kg)  01/25/24 151 lb (68.5 kg)  01/04/24 152 lb 6.4 oz (69.1 kg)    BP Readings from Last 3 Encounters:  04/25/24 (!) 150/90  01/25/24 119/70  01/04/24 120/80     Objective:  Physical Exam Vitals and nursing note reviewed.  Constitutional:      Appearance: Normal appearance.  HENT:     Head: Normocephalic and atraumatic.     Right Ear: Tympanic membrane, ear canal and external ear normal.     Left Ear: Tympanic membrane, ear canal and external ear normal.     Nose: Nose normal.     Mouth/Throat:     Mouth: Mucous membranes are moist.     Pharynx: Oropharynx is clear.  Eyes:     Extraocular Movements: Extraocular movements intact.     Conjunctiva/sclera: Conjunctivae normal.     Pupils: Pupils are equal, round, and reactive to light.  Cardiovascular:     Rate and Rhythm: Normal rate and regular rhythm.     Pulses: Normal pulses.          Dorsalis pedis pulses are 2+ on the right side and 2+ on the left side.     Heart sounds: Normal heart sounds.  Pulmonary:     Effort: Pulmonary effort is normal.     Breath sounds: Normal breath sounds.  Chest:  Breasts:    Tanner Score is 5.     Right: Normal.     Left: Normal.  Abdominal:     General: Abdomen is flat. Bowel sounds are normal.     Palpations: Abdomen is soft.  Genitourinary:    Comments: deferred Musculoskeletal:        General: Normal range of motion.     Cervical back: Normal range of motion and neck supple.  Feet:     Right foot:     Protective Sensation: 5 sites tested.  5 sites sensed.     Skin integrity: Dry skin present.     Toenail Condition: Right toenails are normal.     Left foot:     Protective Sensation: 5 sites tested.  5 sites sensed.     Skin integrity: Dry skin present.     Toenail Condition: Left  toenails are normal.  Skin:    General: Skin is warm and dry.  Neurological:     General: No focal deficit present.     Mental Status: She  is alert and oriented to person, place, and time.  Psychiatric:        Mood and Affect: Mood normal.        Behavior: Behavior normal.       Assessment And Plan:     Encounter for annual health examination Assessment & Plan: A full exam was performed. Importance of monthly self breast exams was discussed with the patient. PATIENT IS ADVISED TO GET 30-45 MINUTES REGULAR EXERCISE NO LESS THAN FOUR TO FIVE DAYS PER WEEK - BOTH WEIGHTBEARING EXERCISES AND AEROBIC ARE RECOMMENDED.  PATIENT IS ADVISED TO FOLLOW A HEALTHY DIET WITH AT LEAST SIX FRUITS/VEGGIES PER DAY, DECREASE INTAKE OF RED MEAT, AND TO INCREASE FISH INTAKE TO TWO DAYS PER WEEK.  MEATS/FISH SHOULD NOT BE FRIED, BAKED OR BROILED IS PREFERABLE.  IT IS ALSO IMPORTANT TO CUT BACK ON YOUR SUGAR INTAKE. PLEASE AVOID ANYTHING WITH ADDED SUGAR, CORN SYRUP OR OTHER SWEETENERS. IF YOU MUST USE A SWEETENER, YOU CAN TRY STEVIA. IT IS ALSO IMPORTANT TO AVOID ARTIFICIALLY SWEETENERS AND DIET BEVERAGES. LASTLY, I SUGGEST WEARING SPF 50 SUNSCREEN ON EXPOSED PARTS AND ESPECIALLY WHEN IN THE DIRECT SUNLIGHT FOR AN EXTENDED PERIOD OF TIME.  PLEASE AVOID FAST FOOD RESTAURANTS AND INCREASE YOUR WATER INTAKE.   Orders: -     CBC -     CMP14+EGFR -     Lipid panel -     Hemoglobin A1c  Type 2 diabetes mellitus with stage 3a chronic kidney disease, with long-term current use of insulin  (HCC) Assessment & Plan: Chronic, diabetic foot exam was performed.  On insulin  therapy with Toujeo  and Ozempic . Morning blood sugars above target. Occasional hypoglycemia. Diabetes control critical for surgical outcomes. - Resend Ozempic  prescription to pharmacy. - Monitor blood glucose levels to adjust insulin  dosage. - Continue lifestyle modifications, including regular walking. - Contact orthopedic surgeon to determine required  A1c level for surgery. - I DISCUSSED WITH THE PATIENT AT LENGTH REGARDING THE GOALS OF GLYCEMIC CONTROL AND POSSIBLE LONG-TERM COMPLICATIONS.  I  ALSO STRESSED THE IMPORTANCE OF COMPLIANCE WITH HOME GLUCOSE MONITORING, DIETARY RESTRICTIONS INCLUDING AVOIDANCE OF SUGARY DRINKS/PROCESSED FOODS,  ALONG WITH REGULAR EXERCISE.  I  ALSO STRESSED THE IMPORTANCE OF ANNUAL EYE EXAMS, SELF FOOT CARE AND COMPLIANCE WITH OFFICE VISITS.    Diabetic mononeuropathy associated with type 2 diabetes mellitus (HCC) Assessment & Plan: Numbness and tingling in toes consistent with diabetic neuropathy. Symptoms manageable without additional medication. - Consider gabapentin  if symptoms worsen.   Hypertensive nephropathy Assessment & Plan: Chronic, uncontrolled. EKG performed, NSR w/o acute changes. No med changes today. She will continue with lisinopril  10mg  daily for now. Blood pressure elevated at 150/90 mmHg.  Elevated reading attributed to rushing and dietary sodium. - Encourage regular home blood pressure monitoring. - Continue lisinopril  for blood pressure management. - Follow low sodium diet.  Orders: -     POCT URINALYSIS DIP (CLINITEK) -     Microalbumin / creatinine urine ratio -     EKG 12-Lead  Chronic migraine without aura without status migrainosus, not intractable Assessment & Plan: Experiences frequent migraines, effectively managed with Ubrelvy , but insurance does not cover it. Maxalt  prescribed as rescue medication. Some headaches attributed to sinus issues and neck tension. - Provide Ubrelvy  samples for migraine treatment. - Ensure Maxalt  (rizatriptan ) is available as a rescue medication. - Consider muscle relaxant for tension headaches.   Traumatic tear of right rotator cuff, unspecified tear extent, sequela Assessment & Plan: Significant rotator cuff tear may require  shoulder replacement. Concern about diabetes impact on surgical outcomes. - Contact orthopedic surgeon to determine  required A1c level for surgery. - Discuss potential shoulder replacement surgery with orthopedic surgeon.   History of iron deficiency anemia -     Iron, TIBC and Ferritin Panel  Other orders -     Semaglutide (0.25 or 0.5MG /DOS); Inject 0.5mg  sq weekly  Dispense: 3 mL; Refill: 3   Return for 1 year physical, 3 month dm check. Patient was given opportunity to ask questions. Patient verbalized understanding of the plan and was able to repeat key elements of the plan. All questions were answered to their satisfaction.   I, [name], MD, have reviewed all documentation for this visit. The documentation on 04/25/24 for the exam, diagnosis, procedures, and orders are all accurate and complete.

## 2024-04-25 NOTE — Assessment & Plan Note (Signed)

## 2024-04-25 NOTE — Patient Instructions (Signed)

## 2024-04-25 NOTE — Assessment & Plan Note (Addendum)
 Chronic, diabetic foot exam was performed.  On insulin  therapy with Toujeo  and Ozempic . Morning blood sugars above target. Occasional hypoglycemia. Diabetes control critical for surgical outcomes. - Resend Ozempic  prescription to pharmacy. - Monitor blood glucose levels to adjust insulin  dosage. - Continue lifestyle modifications, including regular walking. - Contact orthopedic surgeon to determine required A1c level for surgery. - I DISCUSSED WITH THE PATIENT AT LENGTH REGARDING THE GOALS OF GLYCEMIC CONTROL AND POSSIBLE LONG-TERM COMPLICATIONS.  I  ALSO STRESSED THE IMPORTANCE OF COMPLIANCE WITH HOME GLUCOSE MONITORING, DIETARY RESTRICTIONS INCLUDING AVOIDANCE OF SUGARY DRINKS/PROCESSED FOODS,  ALONG WITH REGULAR EXERCISE.  I  ALSO STRESSED THE IMPORTANCE OF ANNUAL EYE EXAMS, SELF FOOT CARE AND COMPLIANCE WITH OFFICE VISITS.

## 2024-04-26 LAB — CMP14+EGFR
ALT: 27 IU/L (ref 0–32)
AST: 23 IU/L (ref 0–40)
Albumin: 4.6 g/dL (ref 3.9–4.9)
Alkaline Phosphatase: 72 IU/L (ref 44–121)
BUN/Creatinine Ratio: 22 (ref 12–28)
BUN: 21 mg/dL (ref 8–27)
Bilirubin Total: <0.2 mg/dL (ref 0.0–1.2)
CO2: 24 mmol/L (ref 20–29)
Calcium: 9.8 mg/dL (ref 8.7–10.3)
Chloride: 100 mmol/L (ref 96–106)
Creatinine, Ser: 0.95 mg/dL (ref 0.57–1.00)
Globulin, Total: 2.7 g/dL (ref 1.5–4.5)
Glucose: 83 mg/dL (ref 70–99)
Potassium: 4.1 mmol/L (ref 3.5–5.2)
Sodium: 141 mmol/L (ref 134–144)
Total Protein: 7.3 g/dL (ref 6.0–8.5)
eGFR: 64 mL/min/1.73 (ref >59)

## 2024-04-26 LAB — IRON,TIBC AND FERRITIN PANEL
Ferritin: 75 ng/mL (ref 15–150)
Iron Saturation: 30 % (ref 15–55)
Iron: 102 ug/dL (ref 27–139)
Total Iron Binding Capacity: 338 ug/dL (ref 250–450)
UIBC: 236 ug/dL (ref 118–369)

## 2024-04-26 LAB — LIPID PANEL
Chol/HDL Ratio: 3.7 ratio (ref 0.0–4.4)
Cholesterol, Total: 185 mg/dL (ref 100–199)
HDL: 50 mg/dL (ref >39)
LDL Chol Calc (NIH): 117 mg/dL — ABNORMAL HIGH (ref 0–99)
Triglycerides: 102 mg/dL (ref 0–149)
VLDL Cholesterol Cal: 18 mg/dL (ref 5–40)

## 2024-04-26 LAB — MICROALBUMIN / CREATININE URINE RATIO
Creatinine, Urine: 64.8 mg/dL
Microalb/Creat Ratio: 25 mg/g{creat} (ref 0–29)
Microalbumin, Urine: 16.1 ug/mL

## 2024-04-26 LAB — CBC
Hematocrit: 39.9 % (ref 34.0–46.6)
Hemoglobin: 12.4 g/dL (ref 11.1–15.9)
MCH: 27.4 pg (ref 26.6–33.0)
MCHC: 31.1 g/dL — ABNORMAL LOW (ref 31.5–35.7)
MCV: 88 fL (ref 79–97)
Platelets: 246 x10E3/uL (ref 150–450)
RBC: 4.52 x10E6/uL (ref 3.77–5.28)
RDW: 13.6 % (ref 11.7–15.4)
WBC: 6.2 x10E3/uL (ref 3.4–10.8)

## 2024-04-26 LAB — HEMOGLOBIN A1C
Est. average glucose Bld gHb Est-mCnc: 148 mg/dL
Hgb A1c MFr Bld: 6.8 % — ABNORMAL HIGH (ref 4.8–5.6)

## 2024-04-30 ENCOUNTER — Encounter: Payer: 59 | Admitting: Internal Medicine

## 2024-04-30 DIAGNOSIS — S46011A Strain of muscle(s) and tendon(s) of the rotator cuff of right shoulder, initial encounter: Secondary | ICD-10-CM | POA: Insufficient documentation

## 2024-04-30 NOTE — Assessment & Plan Note (Signed)
 Experiences frequent migraines, effectively managed with Ubrelvy , but insurance does not cover it. Maxalt  prescribed as rescue medication. Some headaches attributed to sinus issues and neck tension. - Provide Ubrelvy  samples for migraine treatment. - Ensure Maxalt  (rizatriptan ) is available as a rescue medication. - Consider muscle relaxant for tension headaches.

## 2024-04-30 NOTE — Assessment & Plan Note (Signed)
 Significant rotator cuff tear may require shoulder replacement. Concern about diabetes impact on surgical outcomes. - Contact orthopedic surgeon to determine required A1c level for surgery. - Discuss potential shoulder replacement surgery with orthopedic surgeon.

## 2024-05-06 NOTE — Assessment & Plan Note (Signed)
 Numbness and tingling in toes consistent with diabetic neuropathy. Symptoms manageable without additional medication. - Consider gabapentin  if symptoms worsen.

## 2024-05-06 NOTE — Assessment & Plan Note (Addendum)
 Chronic, uncontrolled. EKG performed, NSR w/o acute changes. No med changes today. She will continue with lisinopril  10mg  daily for now. Blood pressure elevated at 150/90 mmHg.  Elevated reading attributed to rushing and dietary sodium. - Encourage regular home blood pressure monitoring. - Continue lisinopril  for blood pressure management. - Follow low sodium diet.

## 2024-05-18 ENCOUNTER — Other Ambulatory Visit: Payer: Self-pay | Admitting: Internal Medicine

## 2024-05-18 DIAGNOSIS — E1122 Type 2 diabetes mellitus with diabetic chronic kidney disease: Secondary | ICD-10-CM

## 2024-05-29 ENCOUNTER — Other Ambulatory Visit: Payer: Self-pay | Admitting: Internal Medicine

## 2024-05-31 ENCOUNTER — Other Ambulatory Visit: Payer: Self-pay | Admitting: Internal Medicine

## 2024-05-31 DIAGNOSIS — Z794 Long term (current) use of insulin: Secondary | ICD-10-CM

## 2024-06-11 NOTE — Progress Notes (Signed)
 Pt attended 03/06/2024 screening event with blood sugar of 142, pt is diabetic (type 2). Pt noted at event that she does have a PCP. At event pt did not indicate any SDOH needs. Pt also noted that she is not a smoker and listed Private as her insurance at the event.   Per chart review pt does have a PCP Sherel Slocumb; [facility]), insurance, and is not a smoker. Pt's lat appt with PCP was 04/25/2024 and has an upcoming appt on 07/26/2024. Pt does not indicate any SDOH needs at this time.  No additional pt f/u to be scheduled at this time per health equity protocol.

## 2024-07-17 ENCOUNTER — Other Ambulatory Visit: Payer: Self-pay | Admitting: Internal Medicine

## 2024-07-17 DIAGNOSIS — Z1231 Encounter for screening mammogram for malignant neoplasm of breast: Secondary | ICD-10-CM

## 2024-07-26 ENCOUNTER — Ambulatory Visit: Admitting: Internal Medicine

## 2024-07-26 ENCOUNTER — Encounter: Payer: Self-pay | Admitting: Internal Medicine

## 2024-07-26 VITALS — BP 122/80 | HR 94 | Temp 98.3°F | Ht 64.0 in | Wt 151.3 lb

## 2024-07-26 DIAGNOSIS — G43709 Chronic migraine without aura, not intractable, without status migrainosus: Secondary | ICD-10-CM

## 2024-07-26 DIAGNOSIS — E2839 Other primary ovarian failure: Secondary | ICD-10-CM

## 2024-07-26 DIAGNOSIS — N1831 Chronic kidney disease, stage 3a: Secondary | ICD-10-CM

## 2024-07-26 DIAGNOSIS — Z862 Personal history of diseases of the blood and blood-forming organs and certain disorders involving the immune mechanism: Secondary | ICD-10-CM

## 2024-07-26 DIAGNOSIS — I129 Hypertensive chronic kidney disease with stage 1 through stage 4 chronic kidney disease, or unspecified chronic kidney disease: Secondary | ICD-10-CM

## 2024-07-26 DIAGNOSIS — Z794 Long term (current) use of insulin: Secondary | ICD-10-CM

## 2024-07-26 DIAGNOSIS — E1141 Type 2 diabetes mellitus with diabetic mononeuropathy: Secondary | ICD-10-CM | POA: Diagnosis not present

## 2024-07-26 DIAGNOSIS — E1122 Type 2 diabetes mellitus with diabetic chronic kidney disease: Secondary | ICD-10-CM

## 2024-07-26 DIAGNOSIS — Z8249 Family history of ischemic heart disease and other diseases of the circulatory system: Secondary | ICD-10-CM

## 2024-07-26 MED ORDER — UBRELVY 100 MG PO TABS: ORAL_TABLET | ORAL | 1 refills | Status: DC

## 2024-07-26 MED ORDER — LISINOPRIL 10 MG PO TABS: 10.0000 mg | ORAL_TABLET | Freq: Every day | ORAL | 1 refills | Status: AC

## 2024-07-26 NOTE — Patient Instructions (Signed)

## 2024-07-26 NOTE — Progress Notes (Signed)
 I,Heather Tanner, CMA,acting as a neurosurgeon for Heather LOISE Slocumb, MD.,have documented all relevant documentation on the behalf of Heather LOISE Slocumb, MD,as directed by  Heather LOISE Slocumb, MD while in the presence of Heather LOISE Slocumb, MD.  Subjective:  Patient ID: Heather Tanner , female    DOB: 06/14/1956 , 68 y.o.   MRN: [id number]  Chief Complaint  Patient presents with   Diabetes    Patient presents today for dm & bp follow up. She reports compliance with medications. Denies headache, chest pain & sob.   Hypertension    HPI Discussed the use of AI scribe software for clinical note transcription with the patient, who gave verbal consent to proceed.  History of Present Illness Heather Tanner is a 68 year old female with diabetes who presents for a diabetes check.  Her blood sugar levels have been stable, typically in the triple digits upon waking, although she experienced a low of 66 on one occasion. She manages her diabetes by drinking plenty of water and monitoring her diet. She is currently taking Jardiance  and Ozempic  0.5 mg for diabetes management.  She experiences headaches and has been prescribed Ubrelvy , which she prefers over Maxalt , taking the 100 mg dose as needed. She has a history of using Depakote  for headaches but discontinued it due to gastrointestinal side effects. She also takes a muscle relaxer as needed, prescribed by her neurologist.  Her current medication regimen includes atorvastatin  40 mg for cholesterol, lisinopril  10 mg for hypertension, and duloxetine  once daily. She takes four pills in the morning and two at night, but she is unsure of the specific medications. She has not been taking iron supplements recently as they were not refilled.  She has a family history of heart disease, with her brother, mother, and father all affected. She has been engaging in walking as a form of exercise.  In terms of social history, she is currently working in an office  undergoing renovations, which has been hectic. She plans to visit [place]  in March for her granddaughter's sixteenth birthday.   Diabetes She presents for her follow-up diabetic visit. She has type 2 diabetes mellitus. Hypoglycemia symptoms include headaches. Pertinent negatives for diabetes include no blurred vision and no chest pain. There are no hypoglycemic complications. Diabetic complications include peripheral neuropathy and retinopathy. Risk factors for coronary artery disease include diabetes mellitus, dyslipidemia, hypertension, post-menopausal and sedentary lifestyle. She participates in exercise intermittently. An ACE inhibitor/angiotensin II receptor blocker is being taken.  Hypertension This is a chronic problem. The current episode started more than 1 year ago. The problem has been gradually improving since onset. The problem is controlled. Associated symptoms include headaches. Pertinent negatives include no blurred vision or chest pain. Past treatments include ACE inhibitors. Compliance problems include exercise.  Hypertensive end-organ damage includes retinopathy.     Past Medical History:  Diagnosis Date   Arthritis    Carpal tunnel syndrome 01/01/2016   right   Diabetes mellitus without complication (HCC)    High cholesterol    Hypertension      Family History  Problem Relation Age of Onset   Diabetes Mother    Heart Problems Mother    Hypertension Mother    Diabetes Father    Hypertension Father    Heart Problems Father    Hypertension Sister    Stroke Sister    Hyperlipidemia Sister    Cancer Sister        Adenocarcinoma   Heart Problems Brother  Healthy Son    Healthy Daughter    Healthy Daughter      Current Outpatient Medications:    albuterol  (VENTOLIN  HFA) 108 (90 Base) MCG/ACT inhaler, Inhale 1-2 puffs into the lungs every 6 (six) hours as needed for wheezing or shortness of breath., Disp: 18 g, Rfl: 3   atorvastatin  (LIPITOR) 40 MG tablet, ,  Disp: , Rfl:    Calcium -Vitamin D-Vitamin K (VIACTIV PO), Take by mouth. 2 per day, Disp: , Rfl:    Continuous Glucose Sensor (DEXCOM G7 SENSOR) MISC, APPLY SENSOR TO SKIN FOR CONTINUOUS BLOOD SUGAR MONITORING, REPLACE EVERY 10 DAYS AS DIRECTED, Disp: 3 each, Rfl: 11   divalproex  (DEPAKOTE  ER) 500 MG 24 hr tablet, Take 1 tablet (500 mg total) by mouth at bedtime., Disp: 30 tablet, Rfl: 11   DULoxetine  (CYMBALTA ) 60 MG capsule, TAKE 1 CAPSULE BY MOUTH DAILY, Disp: 30 capsule, Rfl: 11   insulin  glargine, 1 Unit Dial , (TOUJEO  SOLOSTAR) 300 UNIT/ML Solostar Pen, INJECT 16 UNITS UNDER THE SKIN NIGHTLY, MAX DAILY DOSE OF 60 UNITS, Disp: 6 mL, Rfl: 1   Insulin  Pen Needle (PEN NEEDLES) 31G X 5 MM MISC, Inject 1 each into the skin daily., Disp: 100 each, Rfl: 2   Multiple Vitamin (MULTIVITAMIN) tablet, Take 1 tablet by mouth daily., Disp: , Rfl:    ondansetron  (ZOFRAN -ODT) 4 MG disintegrating tablet, Take 1 tablet (4 mg total) by mouth every 8 (eight) hours as needed for nausea or vomiting., Disp: 20 tablet, Rfl: 11   OVER THE COUNTER MEDICATION, Total beet chew, Disp: , Rfl:    pantoprazole  (PROTONIX ) 40 MG tablet, TAKE 1 TABLET BY MOUTH DAILY, Disp: 90 tablet, Rfl: 1   Semaglutide ,0.25 or 0.5MG /DOS, 2 MG/3ML SOPN, Inject 0.5mg  sq weekly, Disp: 3 mL, Rfl: 3   tiZANidine  (ZANAFLEX ) 4 MG tablet, Take 1 tablet (4 mg total) by mouth 2 (two) times daily as needed for muscle spasms., Disp: 30 tablet, Rfl: 11   Turmeric-Ginger 150-25 MG CHEW, Chew by mouth., Disp: , Rfl:    Ubrogepant  (UBRELVY ) 100 MG TABS, One tab po every day prn, Disp: 16 tablet, Rfl: 1   JARDIANCE  25 MG TABS tablet, TAKE 1 TABLET BY MOUTH DAILY BEFORE BREAKFAST, Disp: 30 tablet, Rfl: 5   lisinopril  (ZESTRIL ) 10 MG tablet, Take 1 tablet (10 mg total) by mouth daily., Disp: 90 tablet, Rfl: 1   Allergies  Allergen Reactions   Latex      Review of Systems  Constitutional: Negative.   Eyes:  Negative for blurred vision.  Respiratory:  Negative.    Cardiovascular: Negative.  Negative for chest pain.  Gastrointestinal: Negative.   Neurological:  Positive for headaches.  Psychiatric/Behavioral: Negative.       Today's Vitals   07/26/24 1455  BP: 122/80  Pulse: 94  Temp: 98.3 F (36.8 C)  SpO2: 98%  Weight: 151 lb 4.8 oz (68.6 kg)  Height: 5' 4 (1.626 m)   Body mass index is 25.97 kg/m.  Wt Readings from Last 3 Encounters:  07/26/24 151 lb 4.8 oz (68.6 kg)  04/25/24 153 lb 9.6 oz (69.7 kg)  01/25/24 151 lb (68.5 kg)     Objective:  Physical Exam Vitals and nursing note reviewed.  Constitutional:      Appearance: Normal appearance.  HENT:     Head: Normocephalic and atraumatic.  Eyes:     Extraocular Movements: Extraocular movements intact.  Cardiovascular:     Rate and Rhythm: Normal rate and regular rhythm.  Heart sounds: Normal heart sounds.  Pulmonary:     Effort: Pulmonary effort is normal.     Breath sounds: Normal breath sounds.  Musculoskeletal:     Cervical back: Normal range of motion.  Skin:    General: Skin is warm.  Neurological:     General: No focal deficit present.     Mental Status: She is alert.  Psychiatric:        Mood and Affect: Mood normal.        Behavior: Behavior normal.         Assessment And Plan:  Type 2 diabetes mellitus with stage 3a chronic kidney disease, with long-term current use of insulin  (HCC) Assessment & Plan: Chronic, appears to be controlled.  Blood sugar levels well-controlled with occasional hypoglycemia. Managed with medication and lifestyle modifications. - Continue current diabetes medications including Jardiance  and Ozempic . - Encourage continued lifestyle modifications including diet and hydration. - Follow up in 3-4 months  Orders: -     Hemoglobin A1c -     BMP8+EGFR  Hypertensive nephropathy Assessment & Plan: Chronic, controlled. She will continue with lisinopril  10mg  daily for now.  - Encourage regular home blood pressure  monitoring. - Continue lisinopril  for blood pressure management. - Follow low sodium diet.  Orders: -     BMP8+EGFR  Diabetic mononeuropathy associated with type 2 diabetes mellitus (HCC) Assessment & Plan: Chronic, no longer on Lyrica .    Chronic migraine without aura without status migrainosus, not intractable Assessment & Plan: Managed with Ubrelvy , preferred over Maxalt . Satisfied with Ubrelvy  100 mg for acute relief. - Discontinue Maxalt . - Continue Ubrelvy  100 mg as needed for migraines. - Provide Ubrelvy  samples and savings card. - Resolve insurance coverage for Ubrelvy .   Family history of heart disease Assessment & Plan: Discussed genetic predisposition and early detection. Considered calcium  score test post blood work. - Order lipoprotein A test to assess genetic predisposition to heart disease. - Consider calcium  score test for heart disease risk assessment; provide information in after visit summary.  Orders: -     Lipoprotein A (LPA) -     CT CARDIAC SCORING (SELF PAY ONLY); Future  Other orders -     Lisinopril ; Take 1 tablet (10 mg total) by mouth daily.  Dispense: 90 tablet; Refill: 1 -     Ubrelvy ; One tab po every day prn  Dispense: 16 tablet; Refill: 1    Return for 4 MONTH DM F/U.SABRA  Patient was given opportunity to ask questions. Patient verbalized understanding of the plan and was able to repeat key elements of the plan. All questions were answered to their satisfaction.  I, Heather LOISE Slocumb, MD, have reviewed all documentation for this visit. The documentation on 07/26/24 for the exam, diagnosis, procedures, and orders are all accurate and complete.   IF YOU HAVE BEEN REFERRED TO A SPECIALIST, IT MAY TAKE 1-2 WEEKS TO SCHEDULE/PROCESS THE REFERRAL. IF YOU HAVE NOT HEARD FROM US /SPECIALIST IN TWO WEEKS, PLEASE GIVE US  A CALL AT [phone].   THE PATIENT IS ENCOURAGED TO PRACTICE SOCIAL DISTANCING DUE TO THE COVID-19 PANDEMIC.

## 2024-07-30 ENCOUNTER — Ambulatory Visit: Payer: Self-pay | Admitting: Internal Medicine

## 2024-07-30 LAB — HEMOGLOBIN A1C
Est. average glucose Bld gHb Est-mCnc: 134 mg/dL
Hgb A1c MFr Bld: 6.3 % — ABNORMAL HIGH (ref 4.8–5.6)

## 2024-07-30 LAB — BMP8+EGFR
BUN/Creatinine Ratio: 21 (ref 12–28)
BUN: 21 mg/dL (ref 8–27)
CO2: 25 mmol/L (ref 20–29)
Calcium: 10 mg/dL (ref 8.7–10.3)
Chloride: 101 mmol/L (ref 96–106)
Creatinine, Ser: 1 mg/dL (ref 0.57–1.00)
Glucose: 99 mg/dL (ref 70–99)
Potassium: 4.7 mmol/L (ref 3.5–5.2)
Sodium: 140 mmol/L (ref 134–144)
eGFR: 61 mL/min/1.73 (ref >59)

## 2024-07-30 LAB — LIPOPROTEIN A (LPA): Lipoprotein (a): 57.1 nmol/L (ref <75.0)

## 2024-07-31 ENCOUNTER — Other Ambulatory Visit: Payer: Self-pay | Admitting: Internal Medicine

## 2024-08-01 DIAGNOSIS — Z8249 Family history of ischemic heart disease and other diseases of the circulatory system: Secondary | ICD-10-CM | POA: Insufficient documentation

## 2024-08-01 NOTE — Assessment & Plan Note (Signed)
 Chronic, appears to be controlled.  Blood sugar levels well-controlled with occasional hypoglycemia. Managed with medication and lifestyle modifications. - Continue current diabetes medications including Jardiance  and Ozempic . - Encourage continued lifestyle modifications including diet and hydration. - Follow up in 3-4 months

## 2024-08-01 NOTE — Assessment & Plan Note (Signed)
 Chronic, no longer on Lyrica .

## 2024-08-01 NOTE — Assessment & Plan Note (Signed)
 Chronic, controlled. She will continue with lisinopril  10mg  daily for now.  - Encourage regular home blood pressure monitoring. - Continue lisinopril  for blood pressure management. - Follow low sodium diet.

## 2024-08-01 NOTE — Assessment & Plan Note (Signed)
 Managed with Ubrelvy , preferred over Maxalt . Satisfied with Ubrelvy  100 mg for acute relief. - Discontinue Maxalt . - Continue Ubrelvy  100 mg as needed for migraines. - Provide Ubrelvy  samples and savings card. - Resolve insurance coverage for Ubrelvy .

## 2024-08-01 NOTE — Assessment & Plan Note (Signed)
 Discussed genetic predisposition and early detection. Considered calcium  score test post blood work. - Order lipoprotein A test to assess genetic predisposition to heart disease. - Consider calcium  score test for heart disease risk assessment; provide information in after visit summary.

## 2024-08-13 ENCOUNTER — Ambulatory Visit: Admitting: Adult Health

## 2024-08-25 ENCOUNTER — Other Ambulatory Visit: Payer: Self-pay | Admitting: Internal Medicine

## 2024-08-25 ENCOUNTER — Other Ambulatory Visit: Payer: Self-pay | Admitting: Neurology

## 2024-08-26 ENCOUNTER — Other Ambulatory Visit: Payer: Self-pay | Admitting: Neurology

## 2024-08-27 NOTE — Telephone Encounter (Signed)
 Last seen on 01/25/24  Continue duloxetine  60 mg nightly  No 6-8 month follow up scheduled

## 2024-08-27 NOTE — Telephone Encounter (Signed)
 Last seen on 01/25/24 per note  Discontinue Depakote  after 1 week of being on topamax  as no benefit  No follow up scheduled

## 2024-08-28 ENCOUNTER — Other Ambulatory Visit: Payer: Self-pay | Admitting: Internal Medicine

## 2024-08-30 ENCOUNTER — Other Ambulatory Visit: Payer: Self-pay | Admitting: Internal Medicine

## 2024-08-30 MED ORDER — UBRELVY 100 MG PO TABS: ORAL_TABLET | ORAL | 1 refills | Status: AC

## 2024-09-07 ENCOUNTER — Inpatient Hospital Stay: Admission: RE | Admit: 2024-09-07 | Discharge: 2024-09-07 | Attending: Internal Medicine | Admitting: Internal Medicine

## 2024-09-07 DIAGNOSIS — Z1231 Encounter for screening mammogram for malignant neoplasm of breast: Secondary | ICD-10-CM

## 2024-09-16 ENCOUNTER — Ambulatory Visit: Payer: Self-pay | Admitting: Internal Medicine

## 2024-10-18 ENCOUNTER — Encounter: Payer: Self-pay | Admitting: Internal Medicine

## 2024-11-03 ENCOUNTER — Other Ambulatory Visit: Payer: Self-pay | Admitting: Neurology

## 2024-11-05 NOTE — Telephone Encounter (Signed)
 Last seen on 01/25/24 per note  Follow-up in 6 to 8 months or call earlier if needed   No follow up scheduled   Rx denied pt needs a follow up scheduled

## 2024-11-27 ENCOUNTER — Ambulatory Visit: Payer: Self-pay | Admitting: Internal Medicine

## 2025-05-01 ENCOUNTER — Encounter: Payer: Self-pay | Admitting: Internal Medicine
